# Patient Record
Sex: Female | Born: 1960 | Race: White | Hispanic: No | Marital: Married | Smoking: Former smoker
Health system: Southern US, Community
[De-identification: ages and names within clinical notes are randomized; demographics above are authoritative.]

## PROBLEM LIST (undated history)

## (undated) DIAGNOSIS — D649 Anemia, unspecified: Secondary | ICD-10-CM

## (undated) DIAGNOSIS — Z923 Personal history of irradiation: Secondary | ICD-10-CM

## (undated) DIAGNOSIS — Z87891 Personal history of nicotine dependence: Secondary | ICD-10-CM

## (undated) DIAGNOSIS — R51 Headache: Secondary | ICD-10-CM

## (undated) DIAGNOSIS — R011 Cardiac murmur, unspecified: Secondary | ICD-10-CM

## (undated) DIAGNOSIS — R42 Dizziness and giddiness: Secondary | ICD-10-CM

## (undated) DIAGNOSIS — K219 Gastro-esophageal reflux disease without esophagitis: Secondary | ICD-10-CM

## (undated) DIAGNOSIS — R519 Headache, unspecified: Secondary | ICD-10-CM

## (undated) DIAGNOSIS — C801 Malignant (primary) neoplasm, unspecified: Secondary | ICD-10-CM

## (undated) HISTORY — DX: Dizziness and giddiness: R42

## (undated) HISTORY — DX: Personal history of nicotine dependence: Z87.891

## (undated) HISTORY — DX: Gastro-esophageal reflux disease without esophagitis: K21.9

## (undated) HISTORY — DX: Personal history of irradiation: Z92.3

## (undated) HISTORY — PX: TONSILLECTOMY: SUR1361

---

## 1988-04-08 HISTORY — PX: DILATION AND CURETTAGE, DIAGNOSTIC / THERAPEUTIC: SUR384

## 1997-06-08 ENCOUNTER — Ambulatory Visit (HOSPITAL_COMMUNITY): Admission: RE | Admit: 1997-06-08 | Discharge: 1997-06-08 | Payer: Self-pay | Admitting: Obstetrics & Gynecology

## 1999-08-31 ENCOUNTER — Other Ambulatory Visit: Admission: RE | Admit: 1999-08-31 | Discharge: 1999-08-31 | Payer: Self-pay | Admitting: Obstetrics & Gynecology

## 2001-12-14 ENCOUNTER — Other Ambulatory Visit: Admission: RE | Admit: 2001-12-14 | Discharge: 2001-12-14 | Payer: Self-pay | Admitting: Obstetrics & Gynecology

## 2005-07-24 ENCOUNTER — Other Ambulatory Visit: Admission: RE | Admit: 2005-07-24 | Discharge: 2005-07-24 | Payer: Self-pay | Admitting: Obstetrics & Gynecology

## 2005-07-26 ENCOUNTER — Encounter: Admission: RE | Admit: 2005-07-26 | Discharge: 2005-07-26 | Payer: Self-pay | Admitting: Obstetrics & Gynecology

## 2010-02-06 DIAGNOSIS — R42 Dizziness and giddiness: Secondary | ICD-10-CM

## 2010-02-06 HISTORY — DX: Dizziness and giddiness: R42

## 2011-04-09 LAB — HM PAP SMEAR: HM Pap smear: NORMAL

## 2011-04-30 ENCOUNTER — Other Ambulatory Visit: Payer: Self-pay | Admitting: Obstetrics & Gynecology

## 2011-04-30 DIAGNOSIS — R928 Other abnormal and inconclusive findings on diagnostic imaging of breast: Secondary | ICD-10-CM

## 2011-05-10 ENCOUNTER — Ambulatory Visit
Admission: RE | Admit: 2011-05-10 | Discharge: 2011-05-10 | Disposition: A | Discharge status: Home / Self Care | Payer: BC Managed Care – PPO | Source: Ambulatory Visit | Attending: Obstetrics & Gynecology | Admitting: Obstetrics & Gynecology

## 2011-05-10 DIAGNOSIS — R928 Other abnormal and inconclusive findings on diagnostic imaging of breast: Secondary | ICD-10-CM

## 2011-05-10 LAB — HM MAMMOGRAPHY: HM Mammogram: NORMAL

## 2011-10-21 ENCOUNTER — Encounter: Payer: Self-pay | Admitting: Internal Medicine

## 2011-10-21 ENCOUNTER — Ambulatory Visit (INDEPENDENT_AMBULATORY_CARE_PROVIDER_SITE_OTHER): Payer: BC Managed Care – PPO | Admitting: Internal Medicine

## 2011-10-21 VITALS — BP 118/76 | HR 83 | Temp 97.6°F | Resp 16 | Ht 61.0 in | Wt 144.5 lb

## 2011-10-21 DIAGNOSIS — Z0000 Encounter for general adult medical examination without abnormal findings: Secondary | ICD-10-CM

## 2011-10-21 NOTE — Progress Notes (Signed)
  Subjective:    Patient ID: Karina Briggs, female    DOB: 1960/10/01, 51 y.o.   MRN: [id number]  HPI  New to me for a physical, she feels well and offers no complaints.  Review of Systems  Constitutional: Negative.   HENT: Negative.   Eyes: Negative.   Respiratory: Negative.   Cardiovascular: Negative.   Gastrointestinal: Negative.   Genitourinary: Negative.   Musculoskeletal: Negative.   Neurological: Negative.   Hematological: Negative.   Psychiatric/Behavioral: Negative.        Objective:   Physical Exam  Vitals reviewed. Constitutional: She is oriented to person, place, and time. She appears well-developed and well-nourished. No distress.  HENT:  Head: Normocephalic and atraumatic.  Mouth/Throat: Oropharynx is clear and moist. No oropharyngeal exudate.  Eyes: Conjunctivae are normal. Right eye exhibits no discharge. Left eye exhibits no discharge. No scleral icterus.  Neck: Normal range of motion. Neck supple. No JVD present. No tracheal deviation present. No thyromegaly present.  Cardiovascular: Normal rate, regular rhythm, normal heart sounds and intact distal pulses.  Exam reveals no gallop and no friction rub.   No murmur heard. Pulmonary/Chest: Effort normal and breath sounds normal. No stridor. No respiratory distress. She has no wheezes. She has no rales. She exhibits no tenderness.  Abdominal: Soft. Bowel sounds are normal. She exhibits no distension and no mass. There is no tenderness. There is no rebound and no guarding.  Musculoskeletal: Normal range of motion. She exhibits no edema.  Lymphadenopathy:    She has no cervical adenopathy.  Neurological: She is alert and oriented to person, place, and time. She has normal reflexes. She displays normal reflexes. No cranial nerve deficit. She exhibits normal muscle tone. Coordination normal.  Skin: Skin is warm and dry. No rash noted. She is not diaphoretic. No erythema. No pallor.  Psychiatric: She has a normal  mood and affect. Her behavior is normal. Judgment and thought content normal.          Assessment & Plan:

## 2011-10-21 NOTE — Patient Instructions (Signed)
Preventive Care for Adults, Female A healthy lifestyle and preventive care can promote health and wellness. Preventive health guidelines for women include the following key practices.  A routine yearly physical is a good way to check with your caregiver about your health and preventive screening. It is a chance to share any concerns and updates on your health, and to receive a thorough exam.   Visit your dentist for a routine exam and preventive care every 6 months. Brush your teeth twice a day and floss once a day. Good oral hygiene prevents tooth decay and gum disease.   The frequency of eye exams is based on your age, health, family medical history, use of contact lenses, and other factors. Follow your caregiver's recommendations for frequency of eye exams.   Eat a healthy diet. Foods like vegetables, fruits, whole grains, low-fat dairy products, and lean protein foods contain the nutrients you need without too many calories. Decrease your intake of foods high in solid fats, added sugars, and salt. Eat the right amount of calories for you.Get information about a proper diet from your caregiver, if necessary.   Regular physical exercise is one of the most important things you can do for your health. Most adults should get at least 150 minutes of moderate-intensity exercise (any activity that increases your heart rate and causes you to sweat) each week. In addition, most adults need muscle-strengthening exercises on 2 or more days a week.   Maintain a healthy weight. The body mass index (BMI) is a screening tool to identify possible weight problems. It provides an estimate of body fat based on height and weight. Your caregiver can help determine your BMI, and can help you achieve or maintain a healthy weight.For adults 20 years and older:   A BMI below 18.5 is considered underweight.   A BMI of 18.5 to 24.9 is normal.   A BMI of 25 to 29.9 is considered overweight.   A BMI of 30 and above is  considered obese.   Maintain normal blood lipids and cholesterol levels by exercising and minimizing your intake of saturated fat. Eat a balanced diet with plenty of fruit and vegetables. Blood tests for lipids and cholesterol should begin at age 20 and be repeated every 5 years. If your lipid or cholesterol levels are high, you are over 50, or you are at high risk for heart disease, you may need your cholesterol levels checked more frequently.Ongoing high lipid and cholesterol levels should be treated with medicines if diet and exercise are not effective.   If you smoke, find out from your caregiver how to quit. If you do not use tobacco, do not start.   If you are pregnant, do not drink alcohol. If you are breastfeeding, be very cautious about drinking alcohol. If you are not pregnant and choose to drink alcohol, do not exceed 1 drink per day. One drink is considered to be 12 ounces (355 mL) of beer, 5 ounces (148 mL) of wine, or 1.5 ounces (44 mL) of liquor.   Avoid use of street drugs. Do not share needles with anyone. Ask for help if you need support or instructions about stopping the use of drugs.   High blood pressure causes heart disease and increases the risk of stroke. Your blood pressure should be checked at least every 1 to 2 years. Ongoing high blood pressure should be treated with medicines if weight loss and exercise are not effective.   If you are 55 to 52   years old, ask your caregiver if you should take aspirin to prevent strokes.   Diabetes screening involves taking a blood sample to check your fasting blood sugar level. This should be done once every 3 years, after age 45, if you are within normal weight and without risk factors for diabetes. Testing should be considered at a younger age or be carried out more frequently if you are overweight and have at least 1 risk factor for diabetes.   Breast cancer screening is essential preventive care for women. You should practice "breast  self-awareness." This means understanding the normal appearance and feel of your breasts and may include breast self-examination. Any changes detected, no matter how small, should be reported to a caregiver. Women in their 20s and 30s should have a clinical breast exam (CBE) by a caregiver as part of a regular health exam every 1 to 3 years. After age 40, women should have a CBE every year. Starting at age 40, women should consider having a mammography (breast X-ray test) every year. Women who have a family history of breast cancer should talk to their caregiver about genetic screening. Women at a high risk of breast cancer should talk to their caregivers about having magnetic resonance imaging (MRI) and a mammography every year.   The Pap test is a screening test for cervical cancer. A Pap test can show cell changes on the cervix that might become cervical cancer if left untreated. A Pap test is a procedure in which cells are obtained and examined from the lower end of the uterus (cervix).   Women should have a Pap test starting at age 21.   Between ages 21 and 29, Pap tests should be repeated every 2 years.   Beginning at age 30, you should have a Pap test every 3 years as long as the past 3 Pap tests have been normal.   Some women have medical problems that increase the chance of getting cervical cancer. Talk to your caregiver about these problems. It is especially important to talk to your caregiver if a new problem develops soon after your last Pap test. In these cases, your caregiver may recommend more frequent screening and Pap tests.   The above recommendations are the same for women who have or have not gotten the vaccine for human papillomavirus (HPV).   If you had a hysterectomy for a problem that was not cancer or a condition that could lead to cancer, then you no longer need Pap tests. Even if you no longer need a Pap test, a regular exam is a good idea to make sure no other problems are  starting.   If you are between ages 65 and 70, and you have had normal Pap tests going back 10 years, you no longer need Pap tests. Even if you no longer need a Pap test, a regular exam is a good idea to make sure no other problems are starting.   If you have had past treatment for cervical cancer or a condition that could lead to cancer, you need Pap tests and screening for cancer for at least 20 years after your treatment.   If Pap tests have been discontinued, risk factors (such as a new sexual partner) need to be reassessed to determine if screening should be resumed.   The HPV test is an additional test that may be used for cervical cancer screening. The HPV test looks for the virus that can cause the cell changes on the cervix.   The cells collected during the Pap test can be tested for HPV. The HPV test could be used to screen women aged 30 years and older, and should be used in women of any age who have unclear Pap test results. After the age of 30, women should have HPV testing at the same frequency as a Pap test.   Colorectal cancer can be detected and often prevented. Most routine colorectal cancer screening begins at the age of 50 and continues through age 75. However, your caregiver may recommend screening at an earlier age if you have risk factors for colon cancer. On a yearly basis, your caregiver may provide home test kits to check for hidden blood in the stool. Use of a small camera at the end of a tube, to directly examine the colon (sigmoidoscopy or colonoscopy), can detect the earliest forms of colorectal cancer. Talk to your caregiver about this at age 50, when routine screening begins. Direct examination of the colon should be repeated every 5 to 10 years through age 75, unless early forms of pre-cancerous polyps or small growths are found.   Hepatitis C blood testing is recommended for all people born from 1945 through 1965 and any individual with known risks for hepatitis C.    Practice safe sex. Use condoms and avoid high-risk sexual practices to reduce the spread of sexually transmitted infections (STIs). STIs include gonorrhea, chlamydia, syphilis, trichomonas, herpes, HPV, and human immunodeficiency virus (HIV). Herpes, HIV, and HPV are viral illnesses that have no cure. They can result in disability, cancer, and death. Sexually active women aged 25 and younger should be checked for chlamydia. Older women with new or multiple partners should also be tested for chlamydia. Testing for other STIs is recommended if you are sexually active and at increased risk.   Osteoporosis is a disease in which the bones lose minerals and strength with aging. This can result in serious bone fractures. The risk of osteoporosis can be identified using a bone density scan. Women ages 65 and over and women at risk for fractures or osteoporosis should discuss screening with their caregivers. Ask your caregiver whether you should take a calcium supplement or vitamin D to reduce the rate of osteoporosis.   Menopause can be associated with physical symptoms and risks. Hormone replacement therapy is available to decrease symptoms and risks. You should talk to your caregiver about whether hormone replacement therapy is right for you.   Use sunscreen with sun protection factor (SPF) of 30 or more. Apply sunscreen liberally and repeatedly throughout the day. You should seek shade when your shadow is shorter than you. Protect yourself by wearing long sleeves, pants, a wide-brimmed hat, and sunglasses year round, whenever you are outdoors.   Once a month, do a whole body skin exam, using a mirror to look at the skin on your back. Notify your caregiver of new moles, moles that have irregular borders, moles that are larger than a pencil eraser, or moles that have changed in shape or color.   Stay current with required immunizations.   Influenza. You need a dose every fall (or winter). The composition of  the flu vaccine changes each year, so being vaccinated once is not enough.   Pneumococcal polysaccharide. You need 1 to 2 doses if you smoke cigarettes or if you have certain chronic medical conditions. You need 1 dose at age 65 (or older) if you have never been vaccinated.   Tetanus, diphtheria, pertussis (Tdap, Td). Get 1 dose of   Tdap vaccine if you are younger than age 65, are over 65 and have contact with an infant, are a healthcare worker, are pregnant, or simply want to be protected from whooping cough. After that, you need a Td booster dose every 10 years. Consult your caregiver if you have not had at least 3 tetanus and diphtheria-containing shots sometime in your life or have a deep or dirty wound.   HPV. You need this vaccine if you are a woman age 26 or younger. The vaccine is given in 3 doses over 6 months.   Measles, mumps, rubella (MMR). You need at least 1 dose of MMR if you were born in 1957 or later. You may also need a second dose.   Meningococcal. If you are age 19 to 21 and a first-year college student living in a residence hall, or have one of several medical conditions, you need to get vaccinated against meningococcal disease. You may also need additional booster doses.   Zoster (shingles). If you are age 60 or older, you should get this vaccine.   Varicella (chickenpox). If you have never had chickenpox or you were vaccinated but received only 1 dose, talk to your caregiver to find out if you need this vaccine.   Hepatitis A. You need this vaccine if you have a specific risk factor for hepatitis A virus infection or you simply wish to be protected from this disease. The vaccine is usually given as 2 doses, 6 to 18 months apart.   Hepatitis B. You need this vaccine if you have a specific risk factor for hepatitis B virus infection or you simply wish to be protected from this disease. The vaccine is given in 3 doses, usually over 6 months.  Preventive Services /  Frequency Ages 19 to 39  Blood pressure check.** / Every 1 to 2 years.   Lipid and cholesterol check.** / Every 5 years beginning at age 20.   Clinical breast exam.** / Every 3 years for women in their 20s and 30s.   Pap test.** / Every 2 years from ages 21 through 29. Every 3 years starting at age 30 through age 65 or 70 with a history of 3 consecutive normal Pap tests.   HPV screening.** / Every 3 years from ages 30 through ages 65 to 70 with a history of 3 consecutive normal Pap tests.   Hepatitis C blood test.** / For any individual with known risks for hepatitis C.   Skin self-exam. / Monthly.   Influenza immunization.** / Every year.   Pneumococcal polysaccharide immunization.** / 1 to 2 doses if you smoke cigarettes or if you have certain chronic medical conditions.   Tetanus, diphtheria, pertussis (Tdap, Td) immunization. / A one-time dose of Tdap vaccine. After that, you need a Td booster dose every 10 years.   HPV immunization. / 3 doses over 6 months, if you are 26 and younger.   Measles, mumps, rubella (MMR) immunization. / You need at least 1 dose of MMR if you were born in 1957 or later. You may also need a second dose.   Meningococcal immunization. / 1 dose if you are age 19 to 21 and a first-year college student living in a residence hall, or have one of several medical conditions, you need to get vaccinated against meningococcal disease. You may also need additional booster doses.   Varicella immunization.** / Consult your caregiver.   Hepatitis A immunization.** / Consult your caregiver. 2 doses, 6 to 18 months   apart.   Hepatitis B immunization.** / Consult your caregiver. 3 doses usually over 6 months.  Ages 40 to 64  Blood pressure check.** / Every 1 to 2 years.   Lipid and cholesterol check.** / Every 5 years beginning at age 20.   Clinical breast exam.** / Every year after age 40.   Mammogram.** / Every year beginning at age 40 and continuing for as  long as you are in good health. Consult with your caregiver.   Pap test.** / Every 3 years starting at age 30 through age 65 or 70 with a history of 3 consecutive normal Pap tests.   HPV screening.** / Every 3 years from ages 30 through ages 65 to 70 with a history of 3 consecutive normal Pap tests.   Fecal occult blood test (FOBT) of stool. / Every year beginning at age 50 and continuing until age 75. You may not need to do this test if you get a colonoscopy every 10 years.   Flexible sigmoidoscopy or colonoscopy.** / Every 5 years for a flexible sigmoidoscopy or every 10 years for a colonoscopy beginning at age 50 and continuing until age 75.   Hepatitis C blood test.** / For all people born from 1945 through 1965 and any individual with known risks for hepatitis C.   Skin self-exam. / Monthly.   Influenza immunization.** / Every year.   Pneumococcal polysaccharide immunization.** / 1 to 2 doses if you smoke cigarettes or if you have certain chronic medical conditions.   Tetanus, diphtheria, pertussis (Tdap, Td) immunization.** / A one-time dose of Tdap vaccine. After that, you need a Td booster dose every 10 years.   Measles, mumps, rubella (MMR) immunization. / You need at least 1 dose of MMR if you were born in 1957 or later. You may also need a second dose.   Varicella immunization.** / Consult your caregiver.   Meningococcal immunization.** / Consult your caregiver.   Hepatitis A immunization.** / Consult your caregiver. 2 doses, 6 to 18 months apart.   Hepatitis B immunization.** / Consult your caregiver. 3 doses, usually over 6 months.  Ages 65 and over  Blood pressure check.** / Every 1 to 2 years.   Lipid and cholesterol check.** / Every 5 years beginning at age 20.   Clinical breast exam.** / Every year after age 40.   Mammogram.** / Every year beginning at age 40 and continuing for as long as you are in good health. Consult with your caregiver.   Pap test.** /  Every 3 years starting at age 30 through age 65 or 70 with a 3 consecutive normal Pap tests. Testing can be stopped between 65 and 70 with 3 consecutive normal Pap tests and no abnormal Pap or HPV tests in the past 10 years.   HPV screening.** / Every 3 years from ages 30 through ages 65 or 70 with a history of 3 consecutive normal Pap tests. Testing can be stopped between 65 and 70 with 3 consecutive normal Pap tests and no abnormal Pap or HPV tests in the past 10 years.   Fecal occult blood test (FOBT) of stool. / Every year beginning at age 50 and continuing until age 75. You may not need to do this test if you get a colonoscopy every 10 years.   Flexible sigmoidoscopy or colonoscopy.** / Every 5 years for a flexible sigmoidoscopy or every 10 years for a colonoscopy beginning at age 50 and continuing until age 75.   Hepatitis   C blood test.** / For all people born from 1945 through 1965 and any individual with known risks for hepatitis C.   Osteoporosis screening.** / A one-time screening for women ages 65 and over and women at risk for fractures or osteoporosis.   Skin self-exam. / Monthly.   Influenza immunization.** / Every year.   Pneumococcal polysaccharide immunization.** / 1 dose at age 65 (or older) if you have never been vaccinated.   Tetanus, diphtheria, pertussis (Tdap, Td) immunization. / A one-time dose of Tdap vaccine if you are over 65 and have contact with an infant, are a healthcare worker, or simply want to be protected from whooping cough. After that, you need a Td booster dose every 10 years.   Varicella immunization.** / Consult your caregiver.   Meningococcal immunization.** / Consult your caregiver.   Hepatitis A immunization.** / Consult your caregiver. 2 doses, 6 to 18 months apart.   Hepatitis B immunization.** / Check with your caregiver. 3 doses, usually over 6 months.  ** Family history and personal history of risk and conditions may change your caregiver's  recommendations. Document Released: 05/21/2001 Document Revised: 03/14/2011 Document Reviewed: 08/20/2010 [employer] Patient Information 2012 [employer]. 

## 2011-10-22 ENCOUNTER — Encounter: Payer: Self-pay | Admitting: Internal Medicine

## 2011-10-22 ENCOUNTER — Other Ambulatory Visit (INDEPENDENT_AMBULATORY_CARE_PROVIDER_SITE_OTHER): Payer: BC Managed Care – PPO

## 2011-10-22 DIAGNOSIS — Z0000 Encounter for general adult medical examination without abnormal findings: Secondary | ICD-10-CM

## 2011-10-22 LAB — COMPREHENSIVE METABOLIC PANEL WITH GFR
ALT: 14 U/L (ref 0–35)
AST: 15 U/L (ref 0–37)
Albumin: 3.7 g/dL (ref 3.5–5.2)
Alkaline Phosphatase: 46 U/L (ref 39–117)
BUN: 14 mg/dL (ref 6–23)
CO2: 27 meq/L (ref 19–32)
Calcium: 8.7 mg/dL (ref 8.4–10.5)
Chloride: 105 meq/L (ref 96–112)
Creatinine, Ser: 0.9 mg/dL (ref 0.4–1.2)
GFR: 72.78 mL/min
Glucose, Bld: 88 mg/dL (ref 70–99)
Potassium: 3.8 meq/L (ref 3.5–5.1)
Sodium: 139 meq/L (ref 135–145)
Total Bilirubin: 0.6 mg/dL (ref 0.3–1.2)
Total Protein: 6.4 g/dL (ref 6.0–8.3)

## 2011-10-22 LAB — CBC WITH DIFFERENTIAL/PLATELET
Basophils Absolute: 0.1 K/uL (ref 0.0–0.1)
Basophils Relative: 1.1 % (ref 0.0–3.0)
Eosinophils Absolute: 0.7 K/uL (ref 0.0–0.7)
Eosinophils Relative: 7.3 % — ABNORMAL HIGH (ref 0.0–5.0)
HCT: 41.1 % (ref 36.0–46.0)
Hemoglobin: 13.8 g/dL (ref 12.0–15.0)
Lymphocytes Relative: 26.9 % (ref 12.0–46.0)
Lymphs Abs: 2.5 K/uL (ref 0.7–4.0)
MCHC: 33.6 g/dL (ref 30.0–36.0)
MCV: 96.3 fl (ref 78.0–100.0)
Monocytes Absolute: 0.7 K/uL (ref 0.1–1.0)
Monocytes Relative: 7.7 % (ref 3.0–12.0)
Neutro Abs: 5.3 K/uL (ref 1.4–7.7)
Neutrophils Relative %: 57 % (ref 43.0–77.0)
Platelets: 214 K/uL (ref 150.0–400.0)
RBC: 4.26 Mil/uL (ref 3.87–5.11)
RDW: 13.2 % (ref 11.5–14.6)
WBC: 9.3 K/uL (ref 4.5–10.5)

## 2011-10-22 LAB — URINALYSIS, ROUTINE W REFLEX MICROSCOPIC
Specific Gravity, Urine: 1.015 (ref 1.000–1.030)
Total Protein, Urine: NEGATIVE
Urine Glucose: NEGATIVE
pH: 6 (ref 5.0–8.0)

## 2011-10-22 LAB — LIPID PANEL
Cholesterol: 206 mg/dL — ABNORMAL HIGH (ref 0–200)
HDL: 64.4 mg/dL
Total CHOL/HDL Ratio: 3
Triglycerides: 58 mg/dL (ref 0.0–149.0)
VLDL: 11.6 mg/dL (ref 0.0–40.0)

## 2011-10-22 NOTE — Assessment & Plan Note (Signed)
Exam done, labs ordered, pt ed material was given 

## 2011-10-23 LAB — TSH: TSH: 1.34 u[IU]/mL (ref 0.35–5.50)

## 2011-10-23 LAB — LDL CHOLESTEROL, DIRECT: Direct LDL: 130.8 mg/dL

## 2011-10-24 ENCOUNTER — Encounter: Payer: Self-pay | Admitting: Internal Medicine

## 2014-09-26 ENCOUNTER — Ambulatory Visit: Payer: Self-pay | Admitting: Gynecologic Oncology

## 2014-09-26 ENCOUNTER — Ambulatory Visit: Payer: BLUE CROSS/BLUE SHIELD | Attending: Gynecologic Oncology | Admitting: Gynecologic Oncology

## 2014-09-26 ENCOUNTER — Encounter: Payer: Self-pay | Admitting: Gynecologic Oncology

## 2014-09-26 VITALS — BP 121/67 | HR 60 | Temp 98.0°F | Resp 18 | Ht 61.0 in | Wt 143.3 lb

## 2014-09-26 DIAGNOSIS — C541 Malignant neoplasm of endometrium: Secondary | ICD-10-CM | POA: Insufficient documentation

## 2014-09-26 NOTE — Progress Notes (Signed)
Consult Note: Gyn-Onc  Consult was requested by Dr. Nori Briggs for the evaluation of Karina Briggs 54 y.o. female  CC:  Chief Complaint  Patient presents with  . endometrial cancer    New consult    Assessment/Plan:  Karina Briggs  is a 54 y.o.  year old with grade 1 endometrioid endometrial cancer, clinical stage I.   A detailed discussion was held with the patient and her family with regard to to her endometrial cancer diagnosis. We discussed the standard management options for uterine cancer which includes surgery followed possibly by adjuvant therapy depending on the results of surgery. The options for surgical management include a hysterectomy and removal of the tubes and ovaries and lymph node sampling. A minimally invasive approach including a robotic hysterectomy or laparoscopic hysterectomy have benefits including shorter hospital stay, recovery time and better wound healing. The alternative approach is an open hysterectomy. The patient has been counseled about these surgical options and the risks of surgery in general including infection, bleeding, damage to surrounding structures (including bowel, bladder, ureters, nerves or vessels), and the postoperative risks of PE/ DVT, and lymphedema. I extensively reviewed the additional risks of robotic hysterectomy including possible need for conversion to open laparotomy.  I discussed positioning during surgery of trendelenberg and risks of minor facial swelling and care we take in preoperative positioning.  After counseling and consideration of her options, she desires to proceed with robotic assisted total hysterectomy, BSO, sentinel lymph node mapping, possible lymphadenectomy.   She will be seen by anesthesia for preoperative clearance and discussion of postoperative pain management.  She was given the opportunity to ask questions, which were answered to her satisfaction, and she is agreement with the above mentioned plan of  care.   HPI: Karina Briggs is a very pleasant G2 P2 who is seen in consultation at the request of Dr. Nori Briggs for grade 1 endometrioid endometrial adenocarcinoma. The patient has been on post menopausal hormone replacement therapy with asked region and every 3 monthly progesterone for approximately one year. In that time she felt to have a normal withdrawal bleeding on progesterone however had regular vaginal spotting. This prompted Dr. Milta Briggs to perform an ultrasound evaluation of the uterus on 08/24/2014. This revealed a 9.5 x 5.3 x 5.7 cm uterus with a 56 mm endometrial stripe. Ovaries were normal. An office endometrial sampling with pelvic biopsy took place on 08/24/2014 and revealed grade 1 adenocarcinoma, endometrioid type.   She is overweight but not obese, with otherwise no major risk factors for endometrial cancer. She has had no prior abdominal surgeries. She has no major medical comorbidities. She has no significant family history for cancer.  Current Meds:  Outpatient Encounter Prescriptions as of 09/26/2014  Medication Sig  . estradiol (MINIVELLE) 0.05 MG/24HR patch Place 1 patch onto the skin 2 (two) times a week.  . loratadine (CLARITIN) 10 MG tablet Take 10 mg by mouth daily as needed.   . [DISCONTINUED] aspirin EC 81 MG tablet Take 81 mg by mouth daily.   No facility-administered encounter medications on file as of 09/26/2014.    Allergy:  Allergies  Allergen Reactions  . Penicillins     Childhood reaction    Social Hx:   History   Social History  . Marital Status: Married    Spouse Name: N/A  . Number of Children: N/A  . Years of Education: N/A   Occupational History  . Not on file.   Social History Main Topics  .  Smoking status: Current Some Day Smoker    Types: Cigarettes  . Smokeless tobacco: Never Used  . Alcohol Use: No  . Drug Use: No  . Sexual Activity: Not Currently    Birth Control/ Protection: Post-menopausal   Other Topics Concern  . Not on file    Social History Narrative    Past Surgical Hx:  Past Surgical History  Procedure Laterality Date  . Dilation and curettage, diagnostic / therapeutic  1990    Past Medical Hx:  Past Medical History  Diagnosis Date  . Dizziness - light-headed 02/2010    treated by ENT and neurology    Past Gynecological History:  SVD x 2  No LMP recorded. Patient is not currently having periods (Reason: Perimenopausal).  Family Hx:  Family History  Problem Relation Age of Onset  . Hypertension Mother   . Arthritis Mother   . Alcohol abuse Neg Hx   . Cancer Neg Hx   . COPD Neg Hx   . Depression Neg Hx   . Diabetes Neg Hx   . Early death Neg Hx   . Heart disease Neg Hx   . Hyperlipidemia Neg Hx   . Stroke Neg Hx     Review of Systems:  Constitutional  Feels well,    ENT Normal appearing ears and nares bilaterally Skin/Breast  No rash, sores, jaundice, itching, dryness Cardiovascular  No chest pain, shortness of breath, or edema  Pulmonary  No cough or wheeze.  Gastro Intestinal  No nausea, vomitting, or diarrhoea. No bright red blood per rectum, no abdominal pain, change in bowel movement, or constipation.  Genito Urinary  No frequency, urgency, dysuria, see HPI Musculo Skeletal  No myalgia, arthralgia, joint swelling or pain  Neurologic  No weakness, numbness, change in gait,  Psychology  No depression, anxiety, insomnia.   Vitals:  Blood pressure 121/67, pulse 60, temperature 98 F (36.7 C), temperature source Oral, resp. rate 18, height 5\' 1"  (1.549 m), weight 143 lb 4.8 oz (65 kg), SpO2 100 %.  Physical Exam: WD in NAD Neck  Supple NROM, without any enlargements.  Lymph Node Survey No cervical supraclavicular or inguinal adenopathy Cardiovascular  Pulse normal rate, regularity and rhythm. S1 and S2 normal.  Lungs  Clear to auscultation bilateraly, without wheezes/crackles/rhonchi. Good air movement.  Skin  No rash/lesions/breakdown  Psychiatry  Alert and  oriented to person, place, and time  Abdomen  Normoactive bowel sounds, abdomen soft, non-tender and obese without evidence of hernia.  Back No CVA tenderness Genito Urinary  Vulva/vagina: Normal external female genitalia.  No lesions. No discharge or bleeding.  Bladder/urethra:  No lesions or masses, well supported bladder  Vagina: normal in appearance  Cervix: Normal appearing, no lesions.  Uterus: Small, mobile, no parametrial involvement or nodularity.  Adnexa: no palpable masses. Rectal  Good tone, no masses no cul de sac nodularity.  Extremities  No bilateral cyanosis, clubbing or edema.   Donaciano Eva, MD   09/26/2014, 2:50 PM

## 2014-09-26 NOTE — Patient Instructions (Signed)
Preparing for your Surgery  Plan for surgery on July 26 with Dr. Denman George.  Pre-operative Testing -You will receive a phone call from presurgical testing at [facility] to arrange for a pre-operative testing appointment before your surgery.  This appointment normally occurs one to two weeks before your scheduled surgery.   -Bring your insurance card, copy of an advanced directive if applicable, medication list  -At that visit, you will be asked to sign a consent for a possible blood transfusion in case a transfusion becomes necessary during surgery.  The need for a blood transfusion is rare but having consent is a necessary part of your care.     -You should not be taking blood thinners or aspirin at least ten days prior to surgery unless instructed by your surgeon.  Day Before Surgery at [place] will be asked to take in only clear liquids the day before surgery.  Examples of clear liquids include broths, jello, and clear juices. You will be advised to have nothing to eat or drink after midnight the evening before.    Your role in recovery Your role is to become active as soon as directed by your doctor, while still giving yourself time to heal.  Rest when you feel tired. You will be asked to do the following in order to speed your recovery:  - Cough and breathe deeply. This helps toclear and expand your lungs and can prevent pneumonia. You may be given a spirometer to practice deep breathing. A staff member will show you how to use the spirometer. - Do mild physical activity. Walking or moving your legs help your circulation and body functions return to normal. A staff member will help you when you try to walk and will provide you with simple exercises. Do not try to get up or walk alone the first time. - Actively manage your pain. Managing your pain lets you move in comfort. We will ask you to rate your pain on a scale of zero to 10. It is your responsibility to tell your  doctor or nurse where and how much you hurt so your pain can be treated.  Special Considerations -If you are diabetic, you may be placed on insulin after surgery to have closer control over your blood sugars to promote healing and recovery.  This does not mean that you will be discharged on insulin.  If applicable, your oral antidiabetics will be resumed when you are tolerating a solid diet.  -Your final pathology results from surgery should be available by the Friday after surgery and the results will be relayed to you when available.  Blood Transfusion Information WHAT IS A BLOOD TRANSFUSION? A transfusion is the replacement of blood or some of its parts. Blood is made up of multiple cells which provide different functions.  Red blood cells carry oxygen and are used for blood loss replacement.  White blood cells fight against infection.  Platelets control bleeding.  Plasma helps clot blood.  Other blood products are available for specialized needs, such as hemophilia or other clotting disorders. BEFORE THE TRANSFUSION  Who gives blood for transfusions?   You may be able to donate blood to be used at a later date on yourself (autologous donation).  Relatives can be asked to donate blood. This is generally not any safer than if you have received blood from a stranger. The same precautions are taken to ensure safety when a relative's blood is donated.  Healthy volunteers who are fully evaluated  to make sure their blood is safe. This is blood bank blood. Transfusion therapy is the safest it has ever been in the practice of medicine. Before blood is taken from a donor, a complete history is taken to make sure that person has no history of diseases nor engages in risky social behavior (examples are intravenous drug use or sexual activity with multiple partners). The donor's travel history is screened to minimize risk of transmitting infections, such as malaria. The donated blood is tested for  signs of infectious diseases, such as HIV and hepatitis. The blood is then tested to be sure it is compatible with you in order to minimize the chance of a transfusion reaction. If you or a relative donates blood, this is often done in anticipation of surgery and is not appropriate for emergency situations. It takes many days to process the donated blood. RISKS AND COMPLICATIONS Although transfusion therapy is very safe and saves many lives, the main dangers of transfusion include:   Getting an infectious disease.  Developing a transfusion reaction. This is an allergic reaction to something in the blood you were given. Every precaution is taken to prevent this. The decision to have a blood transfusion has been considered carefully by your caregiver before blood is given. Blood is not given unless the benefits outweigh the risks.

## 2014-09-30 ENCOUNTER — Telehealth: Payer: Self-pay | Admitting: *Deleted

## 2014-09-30 NOTE — Telephone Encounter (Signed)
Patient scheduled for surgery at [facility] on 10/07/14. Records faxed to [place] at [facility].

## 2014-10-19 NOTE — Progress Notes (Signed)
Please put orders in Epic surgery 10-31-17 pre op 10-28-14 Thanks

## 2014-10-26 ENCOUNTER — Encounter (HOSPITAL_COMMUNITY): Payer: Self-pay

## 2014-10-26 NOTE — Patient Instructions (Addendum)
SHUNTA MCLAURIN  10/26/2014   Your procedure is scheduled on: November 01, 2014  Report to [facility] Main  Entrance take [place]  elevators to 3rd floor to  [place] at 5:30 AM.  Call this number if you have problems the morning of surgery [phone]   Remember: ONLY 1 PERSON MAY GO WITH YOU TO SHORT STAY TO GET  READY MORNING OF [place].  Do not eat food after midnight Sunday night. Follow clear liquid diet beginning AM on Monday.  Then nothing by mouth after midnight Monday night.  No carbonated beverages   Take these medicines the morning of surgery with A SIP OF WATER: none                               You may not have any metal on your body including hair pins and              piercings  Do not wear jewelry, make-up, lotions, powders or perfumes, deodorant             Do not wear nail polish.  Do not shave  48 hours prior to surgery.          .   Do not bring valuables to the hospital. [place].  Contacts, dentures or bridgework may not be worn into surgery.  Leave suitcase in the car. After surgery it may be brought to your room.        Special Instructions: coughing and deep breathing exercises, leg exercises.              Please read over the following fact sheets you were given: _____________________________________________________________________             [facility] - Preparing for Surgery Before surgery, you can play an important role.  Because skin is not sterile, your skin needs to be as free of germs as possible.  You can reduce the number of germs on your skin by washing with CHG (chlorahexidine gluconate) soap before surgery.  CHG is an antiseptic cleaner which kills germs and bonds with the skin to continue killing germs even after washing. Please DO NOT use if you have an allergy to CHG or antibacterial soaps.  If your skin becomes reddened/irritated stop using the CHG and  inform your nurse when you arrive at Short Stay. Do not shave (including legs and underarms) for at least 48 hours prior to the first CHG shower.  You may shave your face/neck. Please follow these instructions carefully:  1.  Shower with CHG Soap the night before surgery and the  morning of Surgery.  2.  If you choose to wash your hair, wash your hair first as usual with your  normal  shampoo.  3.  After you shampoo, rinse your hair and body thoroughly to remove the  shampoo.                           4.  Use CHG as you would any other liquid soap.  You can apply chg directly  to the skin and wash  Gently with a scrungie or clean washcloth.  5.  Apply the CHG Soap to your body ONLY FROM THE NECK DOWN.   Do not use on face/ open                           Wound or open sores. Avoid contact with eyes, ears mouth and genitals (private parts).                       Wash face,  Genitals (private parts) with your normal soap.             6.  Wash thoroughly, paying special attention to the area where your surgery  will be performed.  7.  Thoroughly rinse your body with warm water from the neck down.  8.  DO NOT shower/wash with your normal soap after using and rinsing off  the CHG Soap.                9.  Pat yourself dry with a clean towel.            10.  Wear clean pajamas.            11.  Place clean sheets on your bed the night of your first shower and do not  sleep with pets. Day of Surgery : Do not apply any lotions/deodorants the morning of surgery.  Please wear clean clothes to the hospital/surgery center.  FAILURE TO FOLLOW THESE INSTRUCTIONS MAY RESULT IN THE CANCELLATION OF YOUR SURGERY PATIENT SIGNATURE_________________________________  NURSE SIGNATURE__________________________________  ________________________________________________________________________  WHAT IS A BLOOD TRANSFUSION? Blood Transfusion Information  A transfusion is the replacement of blood or  some of its parts. Blood is made up of multiple cells which provide different functions.  Red blood cells carry oxygen and are used for blood loss replacement.  White blood cells fight against infection.  Platelets control bleeding.  Plasma helps clot blood.  Other blood products are available for specialized needs, such as hemophilia or other clotting disorders. BEFORE THE TRANSFUSION  Who gives blood for transfusions?   Healthy volunteers who are fully evaluated to make sure their blood is safe. This is blood bank blood. Transfusion therapy is the safest it has ever been in the practice of medicine. Before blood is taken from a donor, a complete history is taken to make sure that person has no history of diseases nor engages in risky social behavior (examples are intravenous drug use or sexual activity with multiple partners). The donor's travel history is screened to minimize risk of transmitting infections, such as malaria. The donated blood is tested for signs of infectious diseases, such as HIV and hepatitis. The blood is then tested to be sure it is compatible with you in order to minimize the chance of a transfusion reaction. If you or a relative donates blood, this is often done in anticipation of surgery and is not appropriate for emergency situations. It takes many days to process the donated blood. RISKS AND COMPLICATIONS Although transfusion therapy is very safe and saves many lives, the main dangers of transfusion include:  1. Getting an infectious disease. 2. Developing a transfusion reaction. This is an allergic reaction to something in the blood you were given. Every precaution is taken to prevent this. The decision to have a blood transfusion has been considered carefully by your caregiver before blood is given. Blood is not given unless the benefits outweigh  the risks. AFTER THE TRANSFUSION  Right after receiving a blood transfusion, you will usually feel much better and  more energetic. This is especially true if your red blood cells have gotten low (anemic). The transfusion raises the level of the red blood cells which carry oxygen, and this usually causes an energy increase.  The nurse administering the transfusion will monitor you carefully for complications. HOME CARE INSTRUCTIONS  No special instructions are needed after a transfusion. You may find your energy is better. Speak with your caregiver about any limitations on activity for underlying diseases you may have. SEEK MEDICAL CARE IF:   Your condition is not improving after your transfusion.  You develop redness or irritation at the intravenous (IV) site. SEEK IMMEDIATE MEDICAL CARE IF:  Any of the following symptoms occur over the next 12 hours:  Shaking chills.  You have a temperature by mouth above 102 F (38.9 C), not controlled by medicine.  Chest, back, or muscle pain.  People around you feel you are not acting correctly or are confused.  Shortness of breath or difficulty breathing.  Dizziness and fainting.  You get a rash or develop hives.  You have a decrease in urine output.  Your urine turns a dark color or changes to pink, red, or brown. Any of the following symptoms occur over the next 10 days:  You have a temperature by mouth above 102 F (38.9 C), not controlled by medicine.  Shortness of breath.  Weakness after normal activity.  The white part of the eye turns yellow (jaundice).  You have a decrease in the amount of urine or are urinating less often.  Your urine turns a dark color or changes to pink, red, or brown. Document Released: 03/22/2000 Document Revised: 06/17/2011 Document Reviewed: 11/09/2007 ExitCare Patient Information 2014 [place].  _______________________________________________________________________  Incentive Spirometer  An incentive spirometer is a tool that can help keep your lungs clear and active. This tool measures how well you  are filling your lungs with each breath. Taking long deep breaths may help reverse or decrease the chance of developing breathing (pulmonary) problems (especially infection) following:  A long period of time when you are unable to move or be active. BEFORE THE PROCEDURE   If the spirometer includes an indicator to show your best effort, your nurse or respiratory therapist will set it to a desired goal.  If possible, sit up straight or lean slightly forward. Try not to slouch.  Hold the incentive spirometer in an upright position. INSTRUCTIONS FOR USE  3. Sit on the edge of your bed if possible, or sit up as far as you can in bed or on a chair. 4. Hold the incentive spirometer in an upright position. 5. Breathe out normally. 6. Place the mouthpiece in your mouth and seal your lips tightly around it. 7. Breathe in slowly and as deeply as possible, raising the piston or the ball toward the top of the column. 8. Hold your breath for 3-5 seconds or for as long as possible. Allow the piston or ball to fall to the bottom of the column. 9. Remove the mouthpiece from your mouth and breathe out normally. 10. Rest for a few seconds and repeat Steps 1 through 7 at least 10 times every 1-2 hours when you are awake. Take your time and take a few normal breaths between deep breaths. 11. The spirometer may include an indicator to show your best effort. Use the indicator as a goal to work  toward during each repetition. 12. After each set of 10 deep breaths, practice coughing to be sure your lungs are clear. If you have an incision (the cut made at the time of surgery), support your incision when coughing by placing a pillow or rolled up towels firmly against it. Once you are able to get out of bed, walk around indoors and cough well. You may stop using the incentive spirometer when instructed by your caregiver.  RISKS AND COMPLICATIONS  Take your time so you do not get dizzy or light-headed.  If you are in  pain, you may need to take or ask for pain medication before doing incentive spirometry. It is harder to take a deep breath if you are having pain. AFTER USE  Rest and breathe slowly and easily.  It can be helpful to keep track of a log of your progress. Your caregiver can provide you with a simple table to help with this. If you are using the spirometer at home, follow these instructions: [place] IF:   You are having difficultly using the spirometer.  You have trouble using the spirometer as often as instructed.  Your pain medication is not giving enough relief while using the spirometer.  You develop fever of 100.5 F (38.1 C) or higher. SEEK IMMEDIATE MEDICAL CARE IF:   You cough up bloody sputum that had not been present before.  You develop fever of 102 F (38.9 C) or greater.  You develop worsening pain at or near the incision site. MAKE SURE YOU:   Understand these instructions.  Will watch your condition.  Will get help right away if you are not doing well or get worse. Document Released: 08/05/2006 Document Revised: 06/17/2011 Document Reviewed: 10/06/2006 ExitCare Patient Information 2014 ExitCare, [place].   ________________________________________________________________________    CLEAR LIQUID DIET   Foods Allowed                                                                     Foods Excluded  Coffee and tea, regular and decaf                             liquids that you cannot  Plain Jell-O in any flavor                                             see through such as: Fruit ices (not with fruit pulp)                                     milk, soups, orange juice  Iced Popsicles                                    All solid food  Cranberry, grape and apple juices Sports drinks like Gatorade Lightly seasoned clear broth or consume(fat free) Sugar, honey syrup  Sample Menu Breakfast                                 Lunch                                     Supper Cranberry juice                    Beef broth                            Chicken broth Jell-O                                     Grape juice                           Apple juice Coffee or tea                        Jell-O                                      Popsicle                                                Coffee or tea                        Coffee or tea  _____________________________________________________________________

## 2014-10-28 ENCOUNTER — Encounter (HOSPITAL_COMMUNITY)
Admission: RE | Admit: 2014-10-28 | Discharge: 2014-10-28 | Disposition: A | Discharge status: Home / Self Care | Payer: BLUE CROSS/BLUE SHIELD | Source: Ambulatory Visit | Attending: Gynecologic Oncology | Admitting: Gynecologic Oncology

## 2014-10-28 ENCOUNTER — Encounter (HOSPITAL_COMMUNITY): Payer: Self-pay

## 2014-10-28 DIAGNOSIS — Z01812 Encounter for preprocedural laboratory examination: Secondary | ICD-10-CM | POA: Insufficient documentation

## 2014-10-28 DIAGNOSIS — C541 Malignant neoplasm of endometrium: Secondary | ICD-10-CM | POA: Diagnosis not present

## 2014-10-28 HISTORY — DX: Headache, unspecified: R51.9

## 2014-10-28 HISTORY — DX: Malignant (primary) neoplasm, unspecified: C80.1

## 2014-10-28 HISTORY — DX: Anemia, unspecified: D64.9

## 2014-10-28 HISTORY — DX: Cardiac murmur, unspecified: R01.1

## 2014-10-28 HISTORY — DX: Headache: R51

## 2014-10-28 LAB — URINE MICROSCOPIC-ADD ON

## 2014-10-28 LAB — CBC WITH DIFFERENTIAL/PLATELET
BASOS PCT: 1 % (ref 0–1)
Basophils Absolute: 0.1 10*3/uL (ref 0.0–0.1)
Eosinophils Absolute: 0.4 10*3/uL (ref 0.0–0.7)
Eosinophils Relative: 5 % (ref 0–5)
HEMATOCRIT: 43 % (ref 36.0–46.0)
Hemoglobin: 14.3 g/dL (ref 12.0–15.0)
Lymphocytes Relative: 27 % (ref 12–46)
Lymphs Abs: 2.2 10*3/uL (ref 0.7–4.0)
MCH: 31.8 pg (ref 26.0–34.0)
MCHC: 33.3 g/dL (ref 30.0–36.0)
MCV: 95.6 fL (ref 78.0–100.0)
Monocytes Absolute: 0.6 10*3/uL (ref 0.1–1.0)
Monocytes Relative: 8 % (ref 3–12)
NEUTROS PCT: 59 % (ref 43–77)
Neutro Abs: 4.9 10*3/uL (ref 1.7–7.7)
PLATELETS: 253 10*3/uL (ref 150–400)
RBC: 4.5 MIL/uL (ref 3.87–5.11)
RDW: 13.1 % (ref 11.5–15.5)
WBC: 8.1 10*3/uL (ref 4.0–10.5)

## 2014-10-28 LAB — COMPREHENSIVE METABOLIC PANEL
ALT: 11 U/L — AB (ref 14–54)
AST: 17 U/L (ref 15–41)
Albumin: 4.1 g/dL (ref 3.5–5.0)
Alkaline Phosphatase: 52 U/L (ref 38–126)
Anion gap: 8 (ref 5–15)
BILIRUBIN TOTAL: 0.3 mg/dL (ref 0.3–1.2)
BUN: 18 mg/dL (ref 6–20)
CHLORIDE: 104 mmol/L (ref 101–111)
CO2: 27 mmol/L (ref 22–32)
CREATININE: 0.87 mg/dL (ref 0.44–1.00)
Calcium: 9.4 mg/dL (ref 8.9–10.3)
GFR calc Af Amer: >60 mL/min (ref >60)
Glucose, Bld: 93 mg/dL (ref 65–99)
Potassium: 4.3 mmol/L (ref 3.5–5.1)
SODIUM: 139 mmol/L (ref 135–145)
TOTAL PROTEIN: 7.1 g/dL (ref 6.5–8.1)

## 2014-10-28 LAB — URINALYSIS, ROUTINE W REFLEX MICROSCOPIC
Bilirubin Urine: NEGATIVE
Glucose, UA: NEGATIVE mg/dL
Hgb urine dipstick: NEGATIVE
KETONES UR: NEGATIVE mg/dL
NITRITE: NEGATIVE
Protein, ur: NEGATIVE mg/dL
Specific Gravity, Urine: 1.02 (ref 1.005–1.030)
Urobilinogen, UA: 0.2 mg/dL (ref 0.0–1.0)
pH: 6 (ref 5.0–8.0)

## 2014-10-28 LAB — ABO/RH: ABO/RH(D): A POS

## 2014-10-28 NOTE — Progress Notes (Addendum)
10-26-14 - Pt. Had CXR  On 10-03-14 at [facility].  Called Dr. Serita Grit office and was instructed that we do not need a repeat CXR at time of preop visit.   10-03-14 - EKG - in chart 10-03-14 - 2V CXR - EPIC (care everywhere) 10-03-14 - CBC w/diff, CMP - EPIC (care everywhere) 09-26-14 - LOV - Dr. Denman George - EPIC

## 2014-10-28 NOTE — Progress Notes (Signed)
10-28-14 Faxed Urine culture and UA/Micro to Dr. [name] via [facility]

## 2014-11-01 ENCOUNTER — Ambulatory Visit (HOSPITAL_COMMUNITY): Payer: BLUE CROSS/BLUE SHIELD | Admitting: Anesthesiology

## 2014-11-01 ENCOUNTER — Encounter (HOSPITAL_COMMUNITY)
Admission: RE | Disposition: A | Discharge status: Home / Self Care | Payer: Self-pay | Source: Ambulatory Visit | Attending: Gynecologic Oncology

## 2014-11-01 ENCOUNTER — Ambulatory Visit (HOSPITAL_COMMUNITY)
Admission: RE | Admit: 2014-11-01 | Discharge: 2014-11-02 | Disposition: A | Discharge status: Home / Self Care | Payer: BLUE CROSS/BLUE SHIELD | Source: Ambulatory Visit | Attending: Gynecologic Oncology | Admitting: Gynecologic Oncology

## 2014-11-01 ENCOUNTER — Encounter (HOSPITAL_COMMUNITY): Payer: Self-pay | Admitting: *Deleted

## 2014-11-01 DIAGNOSIS — Z880 Allergy status to penicillin: Secondary | ICD-10-CM | POA: Diagnosis not present

## 2014-11-01 DIAGNOSIS — F17210 Nicotine dependence, cigarettes, uncomplicated: Secondary | ICD-10-CM | POA: Diagnosis not present

## 2014-11-01 DIAGNOSIS — Z79890 Hormone replacement therapy (postmenopausal): Secondary | ICD-10-CM | POA: Diagnosis not present

## 2014-11-01 DIAGNOSIS — Z79899 Other long term (current) drug therapy: Secondary | ICD-10-CM | POA: Diagnosis not present

## 2014-11-01 DIAGNOSIS — N800 Endometriosis of uterus: Secondary | ICD-10-CM | POA: Diagnosis not present

## 2014-11-01 DIAGNOSIS — C541 Malignant neoplasm of endometrium: Secondary | ICD-10-CM | POA: Diagnosis present

## 2014-11-01 HISTORY — PX: ROBOTIC ASSISTED TOTAL HYSTERECTOMY WITH BILATERAL SALPINGO OOPHERECTOMY: SHX6086

## 2014-11-01 LAB — TYPE AND SCREEN
ABO/RH(D): A POS
ANTIBODY SCREEN: NEGATIVE

## 2014-11-01 SURGERY — ROBOTIC ASSISTED TOTAL HYSTERECTOMY WITH BILATERAL SALPINGO OOPHORECTOMY
Anesthesia: General | Laterality: Bilateral

## 2014-11-01 MED ORDER — NEOSTIGMINE METHYLSULFATE 10 MG/10ML IV SOLN
INTRAVENOUS | Status: AC
  Filled 2014-11-01: qty 1

## 2014-11-01 MED ORDER — IBUPROFEN 800 MG PO TABS
800.0000 mg | ORAL_TABLET | Freq: Three times a day (TID) | ORAL | Status: DC | PRN
  Administered 2014-11-02: 800 mg via ORAL
  Filled 2014-11-01: qty 1

## 2014-11-01 MED ORDER — ROCURONIUM BROMIDE 100 MG/10ML IV SOLN
INTRAVENOUS | Status: DC | PRN
  Administered 2014-11-01: 10 mg via INTRAVENOUS
  Administered 2014-11-01: 60 mg via INTRAVENOUS

## 2014-11-01 MED ORDER — PROMETHAZINE HCL 25 MG/ML IJ SOLN: 6.2500 mg | INTRAMUSCULAR | Status: DC | PRN

## 2014-11-01 MED ORDER — CIPROFLOXACIN IN D5W 400 MG/200ML IV SOLN
INTRAVENOUS | Status: DC | PRN
  Administered 2014-11-01: 400 mg via INTRAVENOUS

## 2014-11-01 MED ORDER — DEXAMETHASONE SODIUM PHOSPHATE 10 MG/ML IJ SOLN
INTRAMUSCULAR | Status: DC | PRN
  Administered 2014-11-01: 10 mg via INTRAVENOUS

## 2014-11-01 MED ORDER — PROPOFOL 10 MG/ML IV BOLUS
INTRAVENOUS | Status: AC
  Filled 2014-11-01: qty 20

## 2014-11-01 MED ORDER — KCL IN DEXTROSE-NACL 20-5-0.45 MEQ/L-%-% IV SOLN
INTRAVENOUS | Status: AC
  Filled 2014-11-01: qty 1000

## 2014-11-01 MED ORDER — DEXAMETHASONE SODIUM PHOSPHATE 10 MG/ML IJ SOLN
INTRAMUSCULAR | Status: AC
  Filled 2014-11-01: qty 1

## 2014-11-01 MED ORDER — ROCURONIUM BROMIDE 100 MG/10ML IV SOLN
INTRAVENOUS | Status: AC
  Filled 2014-11-01: qty 1

## 2014-11-01 MED ORDER — ENOXAPARIN SODIUM 40 MG/0.4ML ~~LOC~~ SOLN
40.0000 mg | SUBCUTANEOUS | Status: AC
  Administered 2014-11-01: 40 mg via SUBCUTANEOUS
  Filled 2014-11-01: qty 0.4

## 2014-11-01 MED ORDER — MEPERIDINE HCL 50 MG/ML IJ SOLN: 6.2500 mg | INTRAMUSCULAR | Status: DC | PRN

## 2014-11-01 MED ORDER — CLINDAMYCIN PHOSPHATE 900 MG/50ML IV SOLN
INTRAVENOUS | Status: AC
  Filled 2014-11-01: qty 50

## 2014-11-01 MED ORDER — CLINDAMYCIN PHOSPHATE 900 MG/50ML IV SOLN
900.0000 mg | INTRAVENOUS | Status: AC
  Administered 2014-11-01: 900 mg via INTRAVENOUS

## 2014-11-01 MED ORDER — LACTATED RINGERS IV SOLN: INTRAVENOUS | Status: DC

## 2014-11-01 MED ORDER — CIPROFLOXACIN IN D5W 400 MG/200ML IV SOLN: 400.0000 mg | INTRAVENOUS | Status: DC

## 2014-11-01 MED ORDER — OXYCODONE-ACETAMINOPHEN 5-325 MG PO TABS
1.0000 | ORAL_TABLET | ORAL | Status: DC | PRN
  Administered 2014-11-01 – 2014-11-02 (×4): 1 via ORAL
  Filled 2014-11-01: qty 1
  Filled 2014-11-01: qty 2
  Filled 2014-11-01 (×2): qty 1

## 2014-11-01 MED ORDER — HYDROMORPHONE HCL 1 MG/ML IJ SOLN: 0.2500 mg | INTRAMUSCULAR | Status: DC | PRN

## 2014-11-01 MED ORDER — GLYCOPYRROLATE 0.2 MG/ML IJ SOLN
INTRAMUSCULAR | Status: AC
  Filled 2014-11-01: qty 3

## 2014-11-01 MED ORDER — SUFENTANIL CITRATE 50 MCG/ML IV SOLN
INTRAVENOUS | Status: DC | PRN
  Administered 2014-11-01 (×3): 10 ug via INTRAVENOUS

## 2014-11-01 MED ORDER — ONDANSETRON HCL 4 MG/2ML IJ SOLN
INTRAMUSCULAR | Status: AC
  Filled 2014-11-01: qty 2

## 2014-11-01 MED ORDER — NEOSTIGMINE METHYLSULFATE 10 MG/10ML IV SOLN
INTRAVENOUS | Status: DC | PRN
  Administered 2014-11-01: 4 mg via INTRAVENOUS

## 2014-11-01 MED ORDER — KETOROLAC TROMETHAMINE 15 MG/ML IJ SOLN
15.0000 mg | Freq: Four times a day (QID) | INTRAMUSCULAR | Status: DC | PRN
  Administered 2014-11-01 – 2014-11-02 (×2): 15 mg via INTRAVENOUS
  Filled 2014-11-01 (×2): qty 1

## 2014-11-01 MED ORDER — MIDAZOLAM HCL 2 MG/2ML IJ SOLN
INTRAMUSCULAR | Status: AC
  Filled 2014-11-01: qty 2

## 2014-11-01 MED ORDER — ONDANSETRON HCL 4 MG/2ML IJ SOLN: 4.0000 mg | Freq: Four times a day (QID) | INTRAMUSCULAR | Status: DC | PRN

## 2014-11-01 MED ORDER — ONDANSETRON HCL 4 MG/2ML IJ SOLN
INTRAMUSCULAR | Status: DC | PRN
  Administered 2014-11-01: 4 mg via INTRAVENOUS

## 2014-11-01 MED ORDER — ESTRADIOL 0.05 MG/24HR TD PTWK
0.0500 mg | MEDICATED_PATCH | TRANSDERMAL | Status: DC
  Filled 2014-11-01 (×2): qty 1

## 2014-11-01 MED ORDER — PROPOFOL 10 MG/ML IV BOLUS
INTRAVENOUS | Status: DC | PRN
  Administered 2014-11-01: 50 mg via INTRAVENOUS
  Administered 2014-11-01: 150 mg via INTRAVENOUS

## 2014-11-01 MED ORDER — LACTATED RINGERS IV SOLN
INTRAVENOUS | Status: DC | PRN
  Administered 2014-11-01 (×2): via INTRAVENOUS

## 2014-11-01 MED ORDER — LACTATED RINGERS IR SOLN
Status: DC | PRN
  Administered 2014-11-01: 1000 mL

## 2014-11-01 MED ORDER — ONDANSETRON HCL 4 MG PO TABS: 4.0000 mg | ORAL_TABLET | Freq: Four times a day (QID) | ORAL | Status: DC | PRN

## 2014-11-01 MED ORDER — GLYCOPYRROLATE 0.2 MG/ML IJ SOLN
INTRAMUSCULAR | Status: DC | PRN
  Administered 2014-11-01: .8 mg via INTRAVENOUS

## 2014-11-01 MED ORDER — HYDROMORPHONE HCL 1 MG/ML IJ SOLN
0.2000 mg | INTRAMUSCULAR | Status: AC | PRN
  Administered 2014-11-01: 0.4 mg via INTRAVENOUS
  Administered 2014-11-01: 0.2 mg via INTRAVENOUS
  Filled 2014-11-01 (×2): qty 1

## 2014-11-01 MED ORDER — MIDAZOLAM HCL 5 MG/5ML IJ SOLN
INTRAMUSCULAR | Status: DC | PRN
  Administered 2014-11-01: 2 mg via INTRAVENOUS

## 2014-11-01 MED ORDER — LIDOCAINE HCL (CARDIAC) 20 MG/ML IV SOLN
INTRAVENOUS | Status: AC
  Filled 2014-11-01: qty 5

## 2014-11-01 MED ORDER — STERILE WATER FOR IRRIGATION IR SOLN
Status: DC | PRN
  Administered 2014-11-01: 1500 mL

## 2014-11-01 MED ORDER — CIPROFLOXACIN IN D5W 400 MG/200ML IV SOLN
INTRAVENOUS | Status: AC
  Filled 2014-11-01: qty 200

## 2014-11-01 MED ORDER — ENOXAPARIN SODIUM 40 MG/0.4ML ~~LOC~~ SOLN
40.0000 mg | SUBCUTANEOUS | Status: DC
  Administered 2014-11-02: 40 mg via SUBCUTANEOUS
  Filled 2014-11-01 (×2): qty 0.4

## 2014-11-01 MED ORDER — SUFENTANIL CITRATE 50 MCG/ML IV SOLN
INTRAVENOUS | Status: AC
  Filled 2014-11-01: qty 1

## 2014-11-01 MED ORDER — KETOROLAC TROMETHAMINE 30 MG/ML IJ SOLN: 30.0000 mg | Freq: Once | INTRAMUSCULAR | Status: DC | PRN

## 2014-11-01 MED ORDER — KCL IN DEXTROSE-NACL 20-5-0.45 MEQ/L-%-% IV SOLN
INTRAVENOUS | Status: DC
  Administered 2014-11-01 – 2014-11-02 (×2): via INTRAVENOUS
  Filled 2014-11-01 (×2): qty 1000

## 2014-11-01 MED ORDER — ESTRADIOL 0.05 MG/24HR TD PTTW: 0.0500 mg | MEDICATED_PATCH | TRANSDERMAL | Status: DC

## 2014-11-01 MED ORDER — LIDOCAINE HCL (CARDIAC) 20 MG/ML IV SOLN
INTRAVENOUS | Status: DC | PRN
  Administered 2014-11-01: 75 mg via INTRAVENOUS
  Administered 2014-11-01: 25 mg via INTRATRACHEAL

## 2014-11-01 SURGICAL SUPPLY — 56 items
APL ESCP 34 STRL LF DISP (HEMOSTASIS) ×1
APPLICATOR SURGIFLO ENDO (HEMOSTASIS) ×1 IMPLANT
BAG SPEC RTRVL LRG 6X4 10 (ENDOMECHANICALS)
CABLE HIGH FREQUENCY MONO STRZ (ELECTRODE) ×2 IMPLANT
CHLORAPREP W/TINT 26ML (MISCELLANEOUS) ×2 IMPLANT
CORDS BIPOLAR (ELECTRODE) ×2 IMPLANT
COVER SURGICAL LIGHT HANDLE (MISCELLANEOUS) ×2 IMPLANT
COVER TIP SHEARS 8 DVNC (MISCELLANEOUS) ×1 IMPLANT
COVER TIP SHEARS 8MM DA VINCI (MISCELLANEOUS) ×1
DRAPE SHEET LG 3/4 BI-LAMINATE (DRAPES) ×4 IMPLANT
DRAPE SURG IRRIG POUCH 19X23 (DRAPES) ×2 IMPLANT
DRAPE TABLE BACK 44X90 PK DISP (DRAPES) ×4 IMPLANT
DRAPE WARM FLUID 44X44 (DRAPE) ×2 IMPLANT
DRSG TEGADERM 6X8 (GAUZE/BANDAGES/DRESSINGS) ×2 IMPLANT
ELECT REM PT RETURN 9FT ADLT (ELECTROSURGICAL) ×2
ELECTRODE REM PT RTRN 9FT ADLT (ELECTROSURGICAL) ×1 IMPLANT
GLOVE BIO SURGEON STRL SZ 6 (GLOVE) ×6 IMPLANT
GLOVE BIO SURGEON STRL SZ 6.5 (GLOVE) ×4 IMPLANT
GOWN STRL REUS W/ TWL LRG LVL3 (GOWN DISPOSABLE) ×3 IMPLANT
GOWN STRL REUS W/TWL LRG LVL3 (GOWN DISPOSABLE) ×6
HOLDER FOLEY CATH W/STRAP (MISCELLANEOUS) ×2 IMPLANT
KIT ACCESSORY DA VINCI DISP (KITS) ×1
KIT ACCESSORY DVNC DISP (KITS) ×1 IMPLANT
KIT BASIN OR (CUSTOM PROCEDURE TRAY) ×2 IMPLANT
KIT PROCEDURE DA VINCI SI (MISCELLANEOUS) ×1
KIT PROCEDURE DVNC SI (MISCELLANEOUS) IMPLANT
LIQUID BAND (GAUZE/BANDAGES/DRESSINGS) ×2 IMPLANT
MANIPULATOR UTERINE 4.5 ZUMI (MISCELLANEOUS) ×2 IMPLANT
NDL SAFETY ECLIPSE 18X1.5 (NEEDLE) IMPLANT
NDL SPNL 18GX3.5 QUINCKE PK (NEEDLE) IMPLANT
NEEDLE HYPO 18GX1.5 SHARP (NEEDLE) ×2
NEEDLE SPNL 18GX3.5 QUINCKE PK (NEEDLE) IMPLANT
OCCLUDER COLPOPNEUMO (BALLOONS) ×2 IMPLANT
PEN SKIN MARKING BROAD (MISCELLANEOUS) ×2 IMPLANT
PORT ACCESS TROCAR AIRSEAL 12 (TROCAR) IMPLANT
PORT ACCESS TROCAR AIRSEAL 5M (TROCAR) ×1
POUCH SPECIMEN RETRIEVAL 10MM (ENDOMECHANICALS) IMPLANT
SET TRI-LUMEN FLTR TB AIRSEAL (TUBING) ×2 IMPLANT
SET TUBE IRRIG SUCTION NO TIP (IRRIGATION / IRRIGATOR) ×2 IMPLANT
SHEET LAVH (DRAPES) ×2 IMPLANT
SOLUTION ELECTROLUBE (MISCELLANEOUS) ×2 IMPLANT
SURGIFLO W/THROMBIN 8M KIT (HEMOSTASIS) ×1 IMPLANT
SUT VIC AB 0 CT1 27 (SUTURE) ×2
SUT VIC AB 0 CT1 27XBRD ANTBC (SUTURE) ×1 IMPLANT
SUT VIC AB 4-0 PS2 27 (SUTURE) ×4 IMPLANT
SYR 50ML LL SCALE MARK (SYRINGE) ×2 IMPLANT
SYRINGE 10CC LL (SYRINGE) ×1 IMPLANT
TOWEL OR 17X26 10 PK STRL BLUE (TOWEL DISPOSABLE) ×4 IMPLANT
TOWEL OR NON WOVEN STRL DISP B (DISPOSABLE) ×2 IMPLANT
TRAP SPECIMEN MUCOUS 40CC (MISCELLANEOUS) IMPLANT
TRAY FOLEY W/METER SILVER 14FR (SET/KITS/TRAYS/PACK) ×2 IMPLANT
TRAY LAPAROSCOPIC (CUSTOM PROCEDURE TRAY) ×2 IMPLANT
TROCAR 12M 150ML BLUNT (TROCAR) ×2 IMPLANT
TROCAR BLADELESS OPT 5 100 (ENDOMECHANICALS) ×2 IMPLANT
TROCAR PORT AIRSEAL 5X120 (TROCAR) IMPLANT
WATER STERILE IRR 1500ML POUR (IV SOLUTION) ×2 IMPLANT

## 2014-11-01 NOTE — Anesthesia Preprocedure Evaluation (Addendum)
Anesthesia Evaluation  Patient identified by MRN, date of birth, ID band Patient awake    Reviewed: Allergy & Precautions, NPO status , Patient's Chart, lab work & pertinent test results  Airway Mallampati: II  TM Distance: >3 FB Neck ROM: Full    Dental no notable dental hx.    Pulmonary neg pulmonary ROS, Current Smoker,  breath sounds clear to auscultation  Pulmonary exam normal       Cardiovascular negative cardio ROS Normal cardiovascular exam+ Valvular Problems/Murmurs Rhythm:Regular Rate:Normal     Neuro/Psych  Headaches, negative psych ROS   GI/Hepatic negative GI ROS, Neg liver ROS,   Endo/Other  negative endocrine ROS  Renal/GU negative Renal ROS     Musculoskeletal negative musculoskeletal ROS (+)   Abdominal   Peds  Hematology  (+) anemia ,   Anesthesia Other Findings   Reproductive/Obstetrics negative OB ROS                            Anesthesia Physical Anesthesia Plan  ASA: II  Anesthesia Plan: General   Post-op Pain Management:    Induction: Intravenous  Airway Management Planned: Oral ETT  Additional Equipment:   Intra-op Plan:   Post-operative Plan: Extubation in OR  Informed Consent: I have reviewed the patients History and Physical, chart, labs and discussed the procedure including the risks, benefits and alternatives for the proposed anesthesia with the patient or authorized representative who has indicated his/her understanding and acceptance.   Dental advisory given  Plan Discussed with: CRNA  Anesthesia Plan Comments:         Anesthesia Quick Evaluation

## 2014-11-01 NOTE — Progress Notes (Signed)
Patient complaining of  adominal pain and pressure. Low urinary output noted. Urinary catheter intact. Bladder scan performed x 4 with 2 different bladder scanners each revealed  0 ml.  Patient SBP 90's, notified Joylene John, NP of patients condition. Will continue to monitor patient and notify MD as needed.

## 2014-11-01 NOTE — Anesthesia Postprocedure Evaluation (Signed)
Anesthesia Post Note  Patient: Karina Briggs  Procedure(s) Performed: Procedure(s) (LRB): ROBOTIC ASSISTED TOTAL LAPARSCOPIC  HYSTERECTOMY WITH BILATERAL SALPINGO [place] NODE MAPPING, LYMPHADENECTOMY (Bilateral)  Anesthesia type: General  Patient location: PACU  Post pain: Pain level controlled  Post assessment: Post-op Vital signs reviewed  Last Vitals: BP 108/62 mmHg  Pulse 55  Temp(Src) 36.6 C (Oral)  Resp 18  Ht 5\' 1"  (1.549 m)  Wt 145 lb (65.772 kg)  BMI 27.41 kg/m2  SpO2 100%  Post vital signs: Reviewed  Level of consciousness: sedated  Complications: No apparent anesthesia complications

## 2014-11-01 NOTE — H&P (Signed)
CC:  Chief Complaint  Patient presents with  . endometrial cancer    New consult    Assessment/Plan:  Ms. Karina Briggs is a 54 y.o. year old with grade 1 endometrioid endometrial cancer, clinical stage I.   A detailed discussion was held with the patient and her family with regard to to her endometrial cancer diagnosis. We discussed the standard management options for uterine cancer which includes surgery followed possibly by adjuvant therapy depending on the results of surgery. The options for surgical management include a hysterectomy and removal of the tubes and ovaries and lymph node sampling. A minimally invasive approach including a robotic hysterectomy or laparoscopic hysterectomy have benefits including shorter hospital stay, recovery time and better wound healing. The alternative approach is an open hysterectomy. The patient has been counseled about these surgical options and the risks of surgery in general including infection, bleeding, damage to surrounding structures (including bowel, bladder, ureters, nerves or vessels), and the postoperative risks of PE/ DVT, and lymphedema. I extensively reviewed the additional risks of robotic hysterectomy including possible need for conversion to open laparotomy. I discussed positioning during surgery of trendelenberg and risks of minor facial swelling and care we take in preoperative positioning. After counseling and consideration of her options, she desires to proceed with robotic assisted total hysterectomy, BSO, sentinel lymph node mapping, possible lymphadenectomy.   She will be seen by anesthesia for preoperative clearance and discussion of postoperative pain management. She was given the opportunity to ask questions, which were answered to her satisfaction, and she is agreement with the above mentioned plan of care.   HPI: Karina Briggs is a very pleasant G2 P2 who is seen in consultation at the request of Dr. Nori Riis for grade 1  endometrioid endometrial adenocarcinoma. The patient has been on post menopausal hormone replacement therapy with asked region and every 3 monthly progesterone for approximately one year. In that time she felt to have a normal withdrawal bleeding on progesterone however had regular vaginal spotting. This prompted Dr. Milta Deiters to perform an ultrasound evaluation of the uterus on 08/24/2014. This revealed a 9.5 x 5.3 x 5.7 cm uterus with a 56 mm endometrial stripe. Ovaries were normal. An office endometrial sampling with pelvic biopsy took place on 08/24/2014 and revealed grade 1 adenocarcinoma, endometrioid type.   She is overweight but not obese, with otherwise no major risk factors for endometrial cancer. She has had no prior abdominal surgeries. She has no major medical comorbidities. She has no significant family history for cancer.  Current Meds:  Outpatient Encounter Prescriptions as of 09/26/2014  Medication Sig  . estradiol (MINIVELLE) 0.05 MG/24HR patch Place 1 patch onto the skin 2 (two) times a week.  . loratadine (CLARITIN) 10 MG tablet Take 10 mg by mouth daily as needed.   . [DISCONTINUED] aspirin EC 81 MG tablet Take 81 mg by mouth daily.   No facility-administered encounter medications on file as of 09/26/2014.    Allergy:  Allergies  Allergen Reactions  . Penicillins     Childhood reaction    Social Hx:  History   Social History  . Marital Status: Married    Spouse Name: N/A  . Number of Children: N/A  . Years of Education: N/A   Occupational History  . Not on file.   Social History Main Topics  . Smoking status: Current Some Day Smoker    Types: Cigarettes  . Smokeless tobacco: Never Used  . Alcohol Use: No  . Drug Use:  No  . Sexual Activity: Not Currently    Birth Control/ Protection: Post-menopausal   Other Topics Concern  . Not on file   Social History Narrative    Past  Surgical Hx:  Past Surgical History  Procedure Laterality Date  . Dilation and curettage, diagnostic / therapeutic  1990    Past Medical Hx:  Past Medical History  Diagnosis Date  . Dizziness - light-headed 02/2010    treated by ENT and neurology    Past Gynecological History: SVD x 2 No LMP recorded. Patient is not currently having periods (Reason: Perimenopausal).  Family Hx:  Family History  Problem Relation Age of Onset  . Hypertension Mother   . Arthritis Mother   . Alcohol abuse Neg Hx   . Cancer Neg Hx   . COPD Neg Hx   . Depression Neg Hx   . Diabetes Neg Hx   . Early death Neg Hx   . Heart disease Neg Hx   . Hyperlipidemia Neg Hx   . Stroke Neg Hx     Review of Systems:  Constitutional  Feels well,  ENT Normal appearing ears and nares bilaterally Skin/Breast  No rash, sores, jaundice, itching, dryness Cardiovascular  No chest pain, shortness of breath, or edema  Pulmonary  No cough or wheeze.  Gastro Intestinal  No nausea, vomitting, or diarrhoea. No bright red blood per rectum, no abdominal pain, change in bowel movement, or constipation.  Genito Urinary  No frequency, urgency, dysuria, see HPI Musculo Skeletal  No myalgia, arthralgia, joint swelling or pain  Neurologic  No weakness, numbness, change in gait,  Psychology  No depression, anxiety, insomnia.   Vitals: Blood pressure 121/67, pulse 60, temperature 98 F (36.7 C), temperature source Oral, resp. rate 18, height 5\' 1"  (1.549 m), weight 143 lb 4.8 oz (65 kg), SpO2 100 %.  Physical Exam: WD in NAD Neck  Supple NROM, without any enlargements.  Lymph Node Survey No cervical supraclavicular or inguinal adenopathy Cardiovascular  Pulse normal rate, regularity and rhythm. S1 and S2 normal.  Lungs  Clear to auscultation bilateraly, without wheezes/crackles/rhonchi. Good air movement.  Skin  No  rash/lesions/breakdown  Psychiatry  Alert and oriented to person, place, and time  Abdomen  Normoactive bowel sounds, abdomen soft, non-tender and obese without evidence of hernia.  Back No CVA tenderness Genito Urinary  Vulva/vagina: Normal external female genitalia. No lesions. No discharge or bleeding. Bladder/urethra: No lesions or masses, well supported bladder Vagina: normal in appearance Cervix: Normal appearing, no lesions. Uterus: Small, mobile, no parametrial involvement or nodularity. Adnexa: no palpable masses. Rectal  Good tone, no masses no cul de sac nodularity.  Extremities  No bilateral cyanosis, clubbing or edema.   Donaciano Eva, MD

## 2014-11-01 NOTE — Op Note (Signed)
OPERATIVE NOTE 11/01/14 Surgeon: Donaciano Eva   Assistants: Dr Lahoma Crocker (an MD assistant was necessary for tissue manipulation, management of robotic instrumentation, retraction and positioning due to the complexity of the case and hospital policies).   Anesthesia: General endotracheal anesthesia  ASA Class: 2   Pre-operative Diagnosis: grade 1 endometrial cancer  Post-operative Diagnosis: same  Operation: Robotic-assisted laparoscopic hysterectomy with bilateral salpingoophorectomy, sentinel lymph node mapping and sentinel lymph node biopsy  Surgeon: Donaciano Eva  Assistant Surgeon: Lahoma Crocker MD  Anesthesia: GET  Urine Output: 200  Operative Findings:  : normal apperaing uterus tubes and ovaries, no suspicious appearing nodes  Estimated Blood Loss:  less than 100 mL      Total IV Fluids: 1,000 ml         Specimens: uterus, cervix, bilateral tubes and ovaries, right proximal obturator SLN, right deep obturator SLN, left external iliac SLN, left obturator SLN, washings         Complications:  None; patient tolerated the procedure well.         Disposition: PACU - hemodynamically stable.  Procedure Details  The patient was seen in the Holding Room. The risks, benefits, complications, treatment options, and expected outcomes were discussed with the patient.  The patient concurred with the proposed plan, giving informed consent.  The site of surgery properly noted/marked. The patient was identified as Karina Briggs and the procedure verified as a Robotic-assisted hysterectomy with bilateral salpingo oophorectomy and SLN biopsy. A Time Out was held and the above information confirmed.  After induction of anesthesia, the patient was draped and prepped in the usual sterile manner. Pt was placed in supine position after anesthesia and draped and prepped in the usual sterile manner. The abdominal drape was placed after the CholoraPrep had been allowed  to dry for 3 minutes.  Her arms were tucked to her side with all appropriate precautions.  The acromiums were stabilized with shoulder blocks.  The patient was placed in the semi-lithotomy position in [place].  The perineum was prepped with Betadine.  Foley catheter was placed.  A sterile speculum was placed in the vagina.  The cervix was grasped with a single-tooth tenaculum. 1mg  total of ICG was injected into the cervical stroma at 2 and 9 o'clock at a 76mm depth (concentration 0..5mg /ml).  The cervix was then dilated with [facility] dilators.  The ZUMI uterine manipulator with a medium colpotomizer ring was placed without difficulty.  A pneum occluder balloon was placed over the manipulator.  A second time-out was performed.  OG tube placement was confirmed and to suction.    Procedure:  The patient was brought to the operating room where general anesthesia was administered with no complications.  Patient positioning and ICG injection took place as above.  Next, a 5 mm skin incision was made 1 cm below the subcostal margin in the midclavicular line.  The 5 mm Optiview port and scope was used for direct entry.  Opening pressure was under 10 mm CO2.  The abdomen was insufflated and the findings were noted as above.   At this point and all points during the procedure, the patient's intra-abdominal pressure did not exceed 15 mmHg. Next, a 10 mm skin incision was made 2cm above the umbilicus and a right and left port was placed about 10 cm lateral to the robot port on the right and left side.  A fourth arm was placed in the left lower quadrant 2 cm above and  superior and medial to the anterior superior iliac spine.  All ports were placed under direct visualization.  The patient was placed in steep Trendelenburg.  Bowel was away into the upper abdomen.  The robot was docked in the normal manner.  The right and left peritoneum were opened parallel to the IP ligament to open the retroperitoneal spaces bilaterally.  The SLN mapping was performed in bilateral pelvic basins. It was a successful bilateral mapping. The para rectal and paravesical spaces were opened up. Lymphatic channels were identified travelling to the following visualized sentinel lymph node's: right proximal and deep (more distal) obturator SLN's and left external iliac and obturator SLN. These SLN's were separated from their surrounding lymphatic tissue, removed and sent for permanent pathology.  The hysterectomy was started after the round ligament on the right side was incised and the retroperitoneum was entered and the pararectal space was developed.  The ureter was noted to be on the medial leaf of the broad ligament.  The peritoneum above the ureter was incised and stretched and the infundibulopelvic ligament was skeletonized, cauterized and cut.  The posterior peritoneum was taken down to the level of the KOH ring.  The anterior peritoneum was also taken down.  The bladder flap was created to the level of the KOH ring.  The uterine artery on the right side was skeletonized, cauterized and cut in the normal manner.  A similar procedure was performed on the left.  The colpotomy was made and the uterus, cervix, bilateral ovaries and tubes were amputated and delivered through the vagina.  Pedicles were inspected and excellent hemostasis was achieved.    The colpotomy at the vaginal cuff was closed with Vicryl on a CT1 needle in a running manner.  Irrigation was used and excellent hemostasis was achieved.  At this point in the procedure was completed.  Robotic instruments were removed under direct visulaization.  The robot was undocked. The 10 mm ports were closed with Vicryl on a UR-5 needle and the fascia was closed with 0 Vicryl on a UR-5 needle.  The skin was closed with 4-0 Vicryl in a subcuticular manner.  Dermabond was applied.  Sponge, lap and needle counts correct x 2.  The patient was taken to the recovery room in stable condition.  The vagina  was swabbed with  minimal bleeding noted.   All instrument and needle counts were correct x  3.   The patient was transferred to the recovery room in a stable condition.

## 2014-11-01 NOTE — Anesthesia Procedure Notes (Signed)
Procedure Name: Intubation Date/Time: 11/01/2014 7:46 AM Performed by: Lissa Morales Pre-anesthesia Checklist: Patient identified, Emergency Drugs available, Suction available and Patient being monitored Patient Re-evaluated:Patient Re-evaluated prior to inductionOxygen Delivery Method: Circle System Utilized Preoxygenation: Pre-oxygenation with 100% oxygen Intubation Type: IV induction Ventilation: Mask ventilation without difficulty Laryngoscope Size: Mac and 3 Grade View: Grade II Tube type: Oral (In tubated by paremedic student,Katie) Number of attempts: 2 Airway Equipment and Method: Stylet and Oral airway Placement Confirmation: ETT inserted through vocal cords under direct vision,  positive ETCO2 and breath sounds checked- equal and bilateral Secured at: 21 cm Tube secured with: Tape Dental Injury: Teeth and Oropharynx as per pre-operative assessment

## 2014-11-01 NOTE — Transfer of Care (Signed)
Immediate Anesthesia Transfer of Care Note  Patient: Karina Briggs  Procedure(s) Performed: Procedure(s): ROBOTIC ASSISTED TOTAL LAPARSCOPIC  HYSTERECTOMY WITH BILATERAL SALPINGO [place] NODE MAPPING, LYMPHADENECTOMY (Bilateral)  Patient Location: PACU  Anesthesia Type:General  Level of Consciousness: awake, alert , oriented and patient cooperative  Airway & Oxygen Therapy: Patient Spontanous Breathing and Patient connected to face mask oxygen  Post-op Assessment: Report given to RN, Post -op Vital signs reviewed and stable and Patient moving all extremities X 4  Post vital signs: stable  Last Vitals:  Filed Vitals:   11/01/14 1015  BP: 97/65  Pulse: 79  Temp: 36.5 C  Resp: 12    Complications: No apparent anesthesia complications

## 2014-11-01 NOTE — Progress Notes (Signed)
Patient assessed at the bedside.  Alert, oriented, in no distress.  Husband at the bedside at well.  Stating the lower abdominal pressure has improved after manipulation of the foley by the RN.  Clear, yellow urine draining in the tubing.  Foley assessed and functioning.  Questioned about symptoms of hypotension and denies dizziness, weakness.  Husband stating her BP always runs on the lower end.  No concerns voiced.  Advised to notify her RN for any concerns or questions.

## 2014-11-02 ENCOUNTER — Encounter (HOSPITAL_COMMUNITY): Payer: Self-pay | Admitting: Gynecologic Oncology

## 2014-11-02 DIAGNOSIS — C541 Malignant neoplasm of endometrium: Secondary | ICD-10-CM | POA: Diagnosis not present

## 2014-11-02 LAB — BASIC METABOLIC PANEL
ANION GAP: 5 (ref 5–15)
BUN: 12 mg/dL (ref 6–20)
CALCIUM: 8.9 mg/dL (ref 8.9–10.3)
CO2: 28 mmol/L (ref 22–32)
Chloride: 105 mmol/L (ref 101–111)
Creatinine, Ser: 0.94 mg/dL (ref 0.44–1.00)
GFR calc Af Amer: >60 mL/min (ref >60)
Glucose, Bld: 137 mg/dL — ABNORMAL HIGH (ref 65–99)
Potassium: 5 mmol/L (ref 3.5–5.1)
Sodium: 138 mmol/L (ref 135–145)

## 2014-11-02 LAB — CBC
HCT: 35.2 % — ABNORMAL LOW (ref 36.0–46.0)
HEMOGLOBIN: 11.5 g/dL — AB (ref 12.0–15.0)
MCH: 30.9 pg (ref 26.0–34.0)
MCHC: 32.7 g/dL (ref 30.0–36.0)
MCV: 94.6 fL (ref 78.0–100.0)
PLATELETS: 186 10*3/uL (ref 150–400)
RBC: 3.72 MIL/uL — ABNORMAL LOW (ref 3.87–5.11)
RDW: 12.9 % (ref 11.5–15.5)
WBC: 13.1 10*3/uL — ABNORMAL HIGH (ref 4.0–10.5)

## 2014-11-02 MED ORDER — OXYCODONE-ACETAMINOPHEN 5-325 MG PO TABS: 1.0000 | ORAL_TABLET | ORAL | Status: DC | PRN

## 2014-11-02 NOTE — Discharge Instructions (Signed)
11/02/2014  Return to work: 4-6 weeks if applicable  Activity: 1. Be up and out of the bed during the day.  Take a nap if needed.  You may walk up steps but be careful and use the hand rail.  Stair climbing will tire you more than you think, you may need to stop part way and rest.   2. No lifting or straining for 6 weeks.  3. No driving for 1 week(s).  Do not drive if you are taking narcotic pain medicine.  4. Shower daily.  Use soap and water on your incision and pat dry; don't rub.  No tub baths until cleared by your surgeon.   5. No sexual activity and nothing in the vagina for 8 weeks.  Diet: 1. Low sodium Heart Healthy Diet is recommended.  2. It is safe to use a laxative, such as Miralax or Colace, if you have difficulty moving your bowels.   Wound Care: 1. Keep clean and dry.  Shower daily.  2. You may experience a small amount of clear drainage from your incisions.    Reasons to call the Doctor:  Fever - Oral temperature greater than 100.4 degrees Fahrenheit  Foul-smelling vaginal discharge  Difficulty urinating  Nausea and vomiting  Increased pain at the site of the incision that is unrelieved with pain medicine.  Difficulty breathing with or without chest pain  New calf pain especially if only on one side  Sudden, continuing increased vaginal bleeding with or without clots.   Contacts: For questions or concerns you should contact:  Dr. [name] at [phone]  Joylene John, NP at [phone]  After Hours: call [phone] and ask for the GYN Oncologist on call  Acetaminophen; Oxycodone tablets What is this medicine? ACETAMINOPHEN; OXYCODONE (a set a MEE noe fen; ox i KOE done) is a pain reliever. It is used to treat mild to moderate pain. This medicine may be used for other purposes; ask your health care provider or pharmacist if you have questions. COMMON BRAND NAME(S): Endocet, Magnacet, Narvox, Percocet, Perloxx, Primalev, Primlev, Roxicet,  Xolox What should I tell my health care provider before I take this medicine? They need to know if you have any of these conditions: -brain tumor -Crohn's disease, inflammatory bowel disease, or ulcerative colitis -drug abuse or addiction -head injury -heart or circulation problems -if you often drink alcohol -kidney disease or problems going to the bathroom -liver disease -lung disease, asthma, or breathing problems -an unusual or allergic reaction to acetaminophen, oxycodone, other opioid analgesics, other medicines, foods, dyes, or preservatives -pregnant or trying to get pregnant -breast-feeding How should I use this medicine? Take this medicine by mouth with a full glass of water. Follow the directions on the prescription label. Take your medicine at regular intervals. Do not take your medicine more often than directed. Talk to your pediatrician regarding the use of this medicine in children. Special care may be needed. Patients over 1 years old may have a stronger reaction and need a smaller dose. Overdosage: If you think you have taken too much of this medicine contact a poison control center or emergency room at once. NOTE: This medicine is only for you. Do not share this medicine with others. What if I miss a dose? If you miss a dose, take it as soon as you can. If it is almost time for your next dose, take only that dose. Do not take double or extra doses. What may interact with this medicine? -alcohol -antihistamines -  barbiturates like amobarbital, butalbital, butabarbital, methohexital, pentobarbital, phenobarbital, thiopental, and secobarbital -benztropine -drugs for bladder problems like solifenacin, trospium, oxybutynin, tolterodine, hyoscyamine, and methscopolamine -drugs for breathing problems like ipratropium and tiotropium -drugs for certain stomach or intestine problems like propantheline, homatropine methylbromide, glycopyrrolate, atropine, belladonna, and  dicyclomine -general anesthetics like etomidate, ketamine, nitrous oxide, propofol, desflurane, enflurane, halothane, isoflurane, and sevoflurane -medicines for depression, anxiety, or psychotic disturbances -medicines for sleep -muscle relaxants -naltrexone -narcotic medicines (opiates) for pain -phenothiazines like perphenazine, thioridazine, chlorpromazine, mesoridazine, fluphenazine, prochlorperazine, promazine, and trifluoperazine -scopolamine -tramadol -trihexyphenidyl This list may not describe all possible interactions. Give your health care provider a list of all the medicines, herbs, non-prescription drugs, or dietary supplements you use. Also tell them if you smoke, drink alcohol, or use illegal drugs. Some items may interact with your medicine. What should I watch for while using this medicine? Tell your doctor or health care professional if your pain does not go away, if it gets worse, or if you have new or a different type of pain. You may develop tolerance to the medicine. Tolerance means that you will need a higher dose of the medication for pain relief. Tolerance is normal and is expected if you take this medicine for a long time. Do not suddenly stop taking your medicine because you may develop a severe reaction. Your body becomes used to the medicine. This does NOT mean you are addicted. Addiction is a behavior related to getting and using a drug for a non-medical reason. If you have pain, you have a medical reason to take pain medicine. Your doctor will tell you how much medicine to take. If your doctor wants you to stop the medicine, the dose will be slowly lowered over time to avoid any side effects. You may get drowsy or dizzy. Do not drive, use machinery, or do anything that needs mental alertness until you know how this medicine affects you. Do not stand or sit up quickly, especially if you are an older patient. This reduces the risk of dizzy or fainting spells. Alcohol may  interfere with the effect of this medicine. Avoid alcoholic drinks. There are different types of narcotic medicines (opiates) for pain. If you take more than one type at the same time, you may have more side effects. Give your health care provider a list of all medicines you use. Your doctor will tell you how much medicine to take. Do not take more medicine than directed. Call emergency for help if you have problems breathing. The medicine will cause constipation. Try to have a bowel movement at least every 2 to 3 days. If you do not have a bowel movement for 3 days, call your doctor or health care professional. Do not take Tylenol (acetaminophen) or medicines that have acetaminophen with this medicine. Too much acetaminophen can be very dangerous. Many nonprescription medicines contain acetaminophen. Always read the labels carefully to avoid taking more acetaminophen. What side effects may I notice from receiving this medicine? Side effects that you should report to your doctor or health care professional as soon as possible: -allergic reactions like skin rash, itching or hives, swelling of the face, lips, or tongue -breathing difficulties, wheezing -confusion -light headedness or fainting spells -severe stomach pain -unusually weak or tired -yellowing of the skin or the whites of the eyes Side effects that usually do not require medical attention (report to your doctor or health care professional if they continue or are bothersome): -dizziness -drowsiness -nausea -vomiting This list may  not describe all possible side effects. Call your doctor for medical advice about side effects. You may report side effects to FDA at 1-800-FDA-1088. Where should I keep my medicine? Keep out of the reach of children. This medicine can be abused. Keep your medicine in a safe place to protect it from theft. Do not share this medicine with anyone. Selling or giving away this medicine is dangerous and against the  law. Store at room temperature between 20 and 25 degrees C (68 and 77 degrees F). Keep container tightly closed. Protect from light. This medicine may cause accidental overdose and death if it is taken by other adults, children, or pets. Flush any unused medicine down the toilet to reduce the chance of harm. Do not use the medicine after the expiration date. NOTE: This sheet is a summary. It may not cover all possible information. If you have questions about this medicine, talk to your doctor, pharmacist, or health care provider.  2015, [employer]vier/Gold Standard. (2012-11-16 13:17:35)

## 2014-11-02 NOTE — Progress Notes (Signed)
Pt for discharge . Has voided qs, tolerated regular diet, ambulated independently in hall, and obtained adequate pain relief with po medicine. Discharge instructions discussed with patient and husband until all questions answered.

## 2014-11-02 NOTE — Discharge Summary (Signed)
Physician Discharge Summary  Patient ID: Karina Briggs MRN: [id number] DOB/AGE: 1961-02-18 54 y.o.  Admit date: 11/01/2014 Discharge date: 11/02/2014  Admission Diagnoses: Endometrial cancer  Discharge Diagnoses:  Principal Problem:   Endometrial cancer   Discharged Condition:  The patient is in good condition and stable for discharge.    Hospital Course: On 11/01/2014, the patient underwent the following: Procedure(s): ROBOTIC ASSISTED TOTAL LAPARSCOPIC  HYSTERECTOMY WITH BILATERAL SALPINGO Guys.  The postoperative course was uneventful.  She was discharged to home on postoperative day 1 tolerating a regular diet with minimal pain and voiding without difficulty.  Consults: None  Significant Diagnostic Studies: None  Treatments: surgery: see above  Discharge Exam: Blood pressure 102/55, pulse 64, temperature 98.2 F (36.8 C), temperature source Oral, resp. rate 18, height 5\' 1"  (1.549 m), weight 145 lb (65.772 kg), SpO2 100 %. General appearance: alert, cooperative and no distress Resp: clear to auscultation bilaterally Cardio: regular rate and rhythm, S1, S2 normal, no murmur, click, rub or gallop GI: soft, non-tender; bowel sounds normal; no masses,  no organomegaly Extremities: extremities normal, atraumatic, no cyanosis or edema Incision/Wound: Lap sites with derma-bond without erythema or drainage  Disposition: Home     Discharge Instructions    Call MD for:  difficulty breathing, headache or visual disturbances    Complete by:  As directed      Call MD for:  extreme fatigue    Complete by:  As directed      Call MD for:  hives    Complete by:  As directed      Call MD for:  persistant dizziness or light-headedness    Complete by:  As directed      Call MD for:  persistant nausea and vomiting    Complete by:  As directed      Call MD for:  redness, tenderness, or signs of infection (pain, swelling, redness, odor or  green/yellow discharge around incision site)    Complete by:  As directed      Call MD for:  severe uncontrolled pain    Complete by:  As directed      Call MD for:  temperature >100.4    Complete by:  As directed      Diet - low sodium heart healthy    Complete by:  As directed      Driving Restrictions    Complete by:  As directed   No driving for 1 week.  Do not take narcotics and drive.     Increase activity slowly    Complete by:  As directed      Lifting restrictions    Complete by:  As directed   No lifting greater than 10 lbs.     Sexual Activity Restrictions    Complete by:  As directed   No sexual activity, nothing in the vagina, for 8 weeks.            Medication List    TAKE these medications        MINIVELLE 0.05 MG/24HR patch  Generic drug:  estradiol  Place 1 patch onto the skin 2 (two) times a week.     oxyCODONE-acetaminophen 5-325 MG per tablet  Commonly known as:  PERCOCET/ROXICET  Take 1-2 tablets by mouth every 4 (four) hours as needed for moderate pain or severe pain (moderate to severe pain).       Follow-up Information    Follow up with Donaciano Eva,  MD On 11/28/2014.   Specialty:  Obstetrics and Gynecology   Why:  at the [facility] at 11:45am   Contact information:   [place] [phone]       Greater than thirty minutes were spend for face to face discharge instructions and discharge orders/summary in EPIC.   Signed: CROSS, MELISSA DEAL 11/02/2014, 9:57 AM     I saw and examined the patient and discussed her surgery and discharge. I agree with the above findings and note by APN Deal.  Donaciano Eva, MD

## 2014-11-04 ENCOUNTER — Telehealth: Payer: Self-pay | Admitting: Gynecologic Oncology

## 2014-11-04 NOTE — Telephone Encounter (Signed)
Post op telephone call to check patient status.  Patient describes expected post operative status.  Adequate PO intake reported.  Bowels and bladder functioning without difficulty.  Pain minimal.  Reportable signs and symptoms reviewed.  Follow up appt arranged.  Path discussed.  No concerns voiced.

## 2014-11-15 ENCOUNTER — Telehealth: Payer: Self-pay | Admitting: Gynecologic Oncology

## 2014-11-15 NOTE — Telephone Encounter (Signed)
Duplicate

## 2014-11-15 NOTE — Telephone Encounter (Signed)
Returned call to patient.  Asking about continuing estrogen patch.  Advised to taper herself off, going from twice weekly to once weekly then none.  Advised the use of her estrogen patch would be discussed with Dr. Denman George on Monday, Aug 15.  Advised to call for any questions or concerns and she would be notified of her recommendations.

## 2014-11-22 ENCOUNTER — Telehealth: Payer: Self-pay | Admitting: Gynecologic Oncology

## 2014-11-22 NOTE — Telephone Encounter (Signed)
Called to touch base with patient about her estrogen patch.  She stated she did not replace it after Saturday because she forgot but she is feeling fine with no symptoms.  Advised of Dr. Serita Grit recommendations to taper or wean off over the next few years if she still needed it for symptom relief.  Advised we would not recommend use after 5 years of natural menopause due to breast cancer risk.  She is to call the office if symptoms develop and will remain off of the estrogen patch at this time.  Advised to call for any questions or concerns.

## 2014-11-28 ENCOUNTER — Ambulatory Visit: Payer: BLUE CROSS/BLUE SHIELD | Attending: Gynecologic Oncology | Admitting: Gynecologic Oncology

## 2014-11-28 ENCOUNTER — Encounter: Payer: Self-pay | Admitting: Gynecologic Oncology

## 2014-11-28 VITALS — BP 113/73 | HR 83 | Temp 98.2°F | Resp 16 | Ht 61.0 in | Wt 146.0 lb

## 2014-11-28 DIAGNOSIS — C541 Malignant neoplasm of endometrium: Secondary | ICD-10-CM

## 2014-11-28 NOTE — Progress Notes (Signed)
POSTOPERATIVE FOLLOWUP  Assessment:    54 y.o. year old with Stage IA Grade 1 endometrioid endometrial cancer.   S/p robotic hysterectomy, BSO and pelvic SLN biopsy on 11/01/14. no LVSI, 25% myometrial invasion, negative sentinel lymph nodes.  Plan: 1) Pathology reports reviewed today 2) Treatment counseling - Very low risk (<5%) for recurrence given age, grade, depth of myometrial invasion and LVSI status. Multidisciplinary tumor board recommendation is for routine surveillance with frequent pelvic exams and visits with annual pap smear.  We will start with visits every 6 months x 5 years, at which time she can return to annual visits. We will share and alternate these visits with her primary gynecologist, Dr Nori Riis. Discussed signs and symptoms of recurrence including vaginal bleeding or discharge, leg pain or swelling and changes in bowel or bladder habits. She was given the opportunity to ask questions, which were answered to her satisfaction, and she is agreement with the above mentioned plan of care.  3)  Return to clinic in 6 months and to see Dr Nori Riis  HPI:  Karina Briggs is a 54 y.o. year old initially seen in consultation on 09/26/14 referred by Dr Nori Riis for grade 1 endometrial cancer.  She then underwent a robotic hysterectomy, BSO, and bilateral sentinel lymph node biopsy on 5/70/17 without complications.  Her postoperative course was uncomplicated.  Her final pathologic diagnosis is a Stage IA Grade 1 endometrioid endometrial cancer with no lymphovascular space invasion, 5/28 mm (25%) of myometrial invasion and negative lymph nodes.  She is seen today for a postoperative check and to discuss her pathology results and treatment plan.  Since discharge from the hospital, she is feeling well.  She has improving appetite, normal bowel and bladder function, and pain controlled with minimal PO medication. She has no other complaints today.  Past Medical History  Diagnosis Date  . Dizziness -  light-headed 02/2010    treated by ENT and neurology  . Cancer     endometrial  . Heart murmur     hx of with pregnancy  . Headache     occasionally  . Anemia     hx of with pregnancy   Past Surgical History  Procedure Laterality Date  . Dilation and curettage, diagnostic / therapeutic  1990  . Tonsillectomy    . Robotic assisted total hysterectomy with bilateral salpingo oopherectomy Bilateral 11/01/2014    Procedure: ROBOTIC ASSISTED TOTAL LAPARSCOPIC  HYSTERECTOMY WITH BILATERAL SALPINGO Clarita NODE MAPPING, LYMPHADENECTOMY;  Surgeon: [name], MD;  Location: [facility];  Service: Gynecology;  Laterality: Bilateral;   Family History  Problem Relation Age of Onset  . Hypertension Mother   . Arthritis Mother   . Alcohol abuse Neg Hx   . Cancer Neg Hx   . COPD Neg Hx   . Depression Neg Hx   . Diabetes Neg Hx   . Early death Neg Hx   . Heart disease Neg Hx   . Hyperlipidemia Neg Hx   . Stroke Neg Hx    Social History   Social History  . Marital Status: Married    Spouse Name: N/A  . Number of Children: N/A  . Years of Education: N/A   Occupational History  . Not on file.   Social History Main Topics  . Smoking status: Current Some Day Smoker -- 0.25 packs/day    Types: Cigarettes  . Smokeless tobacco: Never Used  . Alcohol Use: No  . Drug Use: No  . Sexual Activity: Not  Currently    Birth Control/ Protection: Post-menopausal   Other Topics Concern  . Not on file   Social History Narrative   Current Outpatient Prescriptions on File Prior to Visit  Medication Sig Dispense Refill  . estradiol (MINIVELLE) 0.05 MG/24HR patch Place 1 patch onto the skin 2 (two) times a week.    Marland Kitchen oxyCODONE-acetaminophen (PERCOCET/ROXICET) 5-325 MG per tablet Take 1-2 tablets by mouth every 4 (four) hours as needed for moderate pain or severe pain (moderate to severe pain). (Patient not taking: Reported on 11/28/2014) 30 tablet 0   No current facility-administered  medications on file prior to visit.   Allergies  Allergen Reactions  . Penicillins     Childhood reaction     Review of systems: Constitutional:  She has no weight gain or weight loss. She has no fever or chills. Eyes: No blurred vision Ears, Nose, Mouth, Throat: No dizziness, headaches or changes in hearing. No mouth sores. Cardiovascular: No chest pain, palpitations or edema. Respiratory:  No shortness of breath, wheezing or cough Gastrointestinal: She has normal bowel movements without diarrhea or constipation. She denies any nausea or vomiting. She denies blood in her stool or heart burn. Genitourinary:  She denies pelvic pain, pelvic pressure or changes in her urinary function. She has no hematuria, dysuria, or incontinence. She has no irregular vaginal bleeding or vaginal discharge Musculoskeletal: Denies muscle weakness or joint pains.  Skin:  She has no skin changes, rashes or itching Neurological:  Denies dizziness or headaches. No neuropathy, no numbness or tingling. Psychiatric:  She denies depression or anxiety. Hematologic/Lymphatic:   No easy bruising or bleeding   Physical Exam: Blood pressure 113/73, pulse 83, temperature 98.2 F (36.8 C), temperature source Oral, resp. rate 16, height 5\' 1"  (1.549 m), weight 146 lb (66.225 kg), SpO2 100 %. General: Well dressed, well nourished in no apparent distress.   HEENT:  Normocephalic and atraumatic, no lesions.  Extraocular muscles intact. Sclerae anicteric. Pupils equal, round, reactive. No mouth sores or ulcers. Thyroid is normal size, not nodular, midline. Skin:  No lesions or rashes. Breasts:  deferred Lungs:  deferred Cardiovascular: deferred Abdomen:  Soft, nontender, nondistended.  No palpable masses.  No hepatosplenomegaly.  No ascites. Normal bowel sounds.  No hernias.  Incisions are well healed Genitourinary: Normal EGBUS  Vaginal cuff intact.  No bleeding or discharge.  No cul de sac fullness. Extremities: No  cyanosis, clubbing or edema.  No calf tenderness or erythema. No palpable cords. Psychiatric: Mood and affect are appropriate. Neurological: Awake, alert and oriented x 3. Sensation is intact, no neuropathy.  Musculoskeletal: No pain, normal strength and range of motion.  Donaciano Eva, MD

## 2014-11-28 NOTE — Patient Instructions (Signed)
Plan to follow up in six months or sooner if needed.  Please call closer to the date to schedule.  Please call for any questions or concerns.

## 2015-08-28 DIAGNOSIS — Z01419 Encounter for gynecological examination (general) (routine) without abnormal findings: Secondary | ICD-10-CM | POA: Diagnosis not present

## 2015-08-28 DIAGNOSIS — Z6828 Body mass index (BMI) 28.0-28.9, adult: Secondary | ICD-10-CM | POA: Diagnosis not present

## 2015-08-28 DIAGNOSIS — Z1231 Encounter for screening mammogram for malignant neoplasm of breast: Secondary | ICD-10-CM | POA: Diagnosis not present

## 2015-08-28 DIAGNOSIS — Z8542 Personal history of malignant neoplasm of other parts of uterus: Secondary | ICD-10-CM | POA: Diagnosis not present

## 2015-09-20 DIAGNOSIS — Z130 Encounter for screening for diseases of the blood and blood-forming organs and certain disorders involving the immune mechanism: Secondary | ICD-10-CM | POA: Diagnosis not present

## 2015-09-20 DIAGNOSIS — R87628 Other abnormal cytological findings on specimens from vagina: Secondary | ICD-10-CM | POA: Diagnosis not present

## 2015-09-20 DIAGNOSIS — M8580 Other specified disorders of bone density and structure, unspecified site: Secondary | ICD-10-CM | POA: Diagnosis not present

## 2015-09-20 DIAGNOSIS — Z131 Encounter for screening for diabetes mellitus: Secondary | ICD-10-CM | POA: Diagnosis not present

## 2015-09-20 DIAGNOSIS — Z13220 Encounter for screening for lipoid disorders: Secondary | ICD-10-CM | POA: Diagnosis not present

## 2015-09-20 DIAGNOSIS — Z13820 Encounter for screening for osteoporosis: Secondary | ICD-10-CM | POA: Diagnosis not present

## 2015-09-20 DIAGNOSIS — Z8542 Personal history of malignant neoplasm of other parts of uterus: Secondary | ICD-10-CM | POA: Diagnosis not present

## 2015-09-20 DIAGNOSIS — Z13228 Encounter for screening for other metabolic disorders: Secondary | ICD-10-CM | POA: Diagnosis not present

## 2015-09-27 ENCOUNTER — Telehealth: Payer: Self-pay | Admitting: Gynecologic Oncology

## 2015-09-27 NOTE — Telephone Encounter (Signed)
Informed patient that Dr. Denman George had reviewed her recent pap smear results with no further recommendations and to plan to follow up as planned.  Patient stating she had experienced light vaginal spotting after intercourse.  Advised patient that Dr. Denman George would be made aware and we would reach out to her if any other recommendations are provided.  Verbalizing understanding.  No concerns voiced.

## 2015-09-29 ENCOUNTER — Telehealth: Payer: Self-pay

## 2015-09-29 NOTE — Telephone Encounter (Signed)
Orders received to contact the patient to schedule follow up for "vaginal" bleeding after intercourse . Patient contacted and updated with Dr [name] Rossi's recommendations for follow up, patient states she has an appointment with Dr Nori Riis on Monday June 26 , 2017 foe a colposcopy . Patient states she palns to follow through with appointment and follow the recommendations of Dr Nori Riis for follow up with Dr Denman George. Patient denies further questions at this time , will update Melissa Cross, APNP.

## 2015-10-02 DIAGNOSIS — C52 Malignant neoplasm of vagina: Secondary | ICD-10-CM | POA: Diagnosis not present

## 2015-10-02 DIAGNOSIS — N842 Polyp of vagina: Secondary | ICD-10-CM | POA: Diagnosis not present

## 2015-10-06 ENCOUNTER — Ambulatory Visit: Payer: BLUE CROSS/BLUE SHIELD | Attending: Gynecologic Oncology | Admitting: Gynecologic Oncology

## 2015-10-06 ENCOUNTER — Encounter: Payer: Self-pay | Admitting: Gynecologic Oncology

## 2015-10-06 VITALS — BP 115/66 | HR 83 | Temp 98.2°F | Resp 18 | Ht 61.0 in | Wt 154.3 lb

## 2015-10-06 DIAGNOSIS — R51 Headache: Secondary | ICD-10-CM | POA: Diagnosis not present

## 2015-10-06 DIAGNOSIS — R011 Cardiac murmur, unspecified: Secondary | ICD-10-CM | POA: Diagnosis not present

## 2015-10-06 DIAGNOSIS — Z880 Allergy status to penicillin: Secondary | ICD-10-CM | POA: Diagnosis not present

## 2015-10-06 DIAGNOSIS — C52 Malignant neoplasm of vagina: Secondary | ICD-10-CM | POA: Diagnosis not present

## 2015-10-06 DIAGNOSIS — C541 Malignant neoplasm of endometrium: Secondary | ICD-10-CM | POA: Insufficient documentation

## 2015-10-06 DIAGNOSIS — F17210 Nicotine dependence, cigarettes, uncomplicated: Secondary | ICD-10-CM | POA: Diagnosis not present

## 2015-10-06 DIAGNOSIS — C7982 Secondary malignant neoplasm of genital organs: Secondary | ICD-10-CM | POA: Insufficient documentation

## 2015-10-06 NOTE — Progress Notes (Signed)
POSTOPERATIVE FOLLOWUP  Assessment:    55 y.o. 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 No prior adjuvant therapy due to low risk features on initial diagnosis. Clinically localized recurrence.  Plan: 1) Pathology reports reviewed today 2) Treatment counseling - I discussed that we should first obtain imaging with PET/CT to evaluate for distant metastases. If the disease is localized to the vagina, I recommend chemoradiation (external beam and vaginal brachytherapy with weekly radiosensitizing cddp).   I recommend referral to radiation oncology, Dr Sondra Come, and to medical oncology.   I discussed that 70% of these cases are successfully salvaged with this regimen.  I will see her back after completion of therapy for evaluation of response.  HPI:  Karina Briggs is a 54 y.o. year old initially seen in consultation on 09/26/14 referred by Dr Nori Riis for grade 1 endometrial cancer.  She then underwent a robotic hysterectomy, BSO, and bilateral sentinel lymph node biopsy on [id number] without complications.  Her postoperative course was uncomplicated.  Her final pathologic diagnosis is a Stage IA Grade 1 endometrioid endometrial cancer with no lymphovascular space invasion, 5/28 mm (25%) of myometrial invasion and negative lymph nodes.  Interval Hx: She had a history of postcoital spotting since March, 2017. She saw Dr Nori Riis for routine surveillance exam in may, 2017 and a pap smear was taken which showed endometrial cells and a second showed endometrial cells. As response to this a colposcopy was performed and revealed a nodule at the right vaginal cuff which was biopsied on 10/02/15 which confirmed adenocarcinoma similar to her previous endometrial cancer.    Past Medical History  Diagnosis Date  . Dizziness - light-headed 02/2010    treated by ENT and neurology  . Cancer (HCC)     endometrial  . Heart murmur     hx of with  pregnancy  . Headache     occasionally  . Anemia     hx of with pregnancy   Past Surgical History  Procedure Laterality Date  . Dilation and curettage, diagnostic / therapeutic  1990  . Tonsillectomy    . Robotic assisted total hysterectomy with bilateral salpingo oopherectomy Bilateral 11/01/2014    Procedure: ROBOTIC ASSISTED TOTAL LAPARSCOPIC  HYSTERECTOMY WITH BILATERAL SALPINGO [place] NODE MAPPING, LYMPHADENECTOMY;  Surgeon: [name], MD;  Location: [facility];  Service: Gynecology;  Laterality: Bilateral;   Family History  Problem Relation Age of Onset  . Hypertension Mother   . Arthritis Mother   . Alcohol abuse Neg Hx   . Cancer Neg Hx   . COPD Neg Hx   . Depression Neg Hx   . Diabetes Neg Hx   . Early death Neg Hx   . Heart disease Neg Hx   . Hyperlipidemia Neg Hx   . Stroke Neg Hx    Social History   Social History  . Marital Status: Married    Spouse Name: N/A  . Number of Children: N/A  . Years of Education: N/A   Occupational History  . Not on file.   Social History Main Topics  . Smoking status: Current Some Day Smoker -- 0.25 packs/day    Types: Cigarettes  . Smokeless tobacco: Never Used  . Alcohol Use: No  . Drug Use: No  . Sexual Activity: Not Currently    Birth Control/ Protection: Post-menopausal   Other Topics Concern  . Not on file   Social History Narrative   No current  outpatient prescriptions on file prior to visit.   No current facility-administered medications on file prior to visit.   Allergies  Allergen Reactions  . Penicillins     Childhood reaction     Review of systems: Constitutional:  She has no weight gain or weight loss. She has no fever or chills. Eyes: No blurred vision Ears, Nose, Mouth, Throat: No dizziness, headaches or changes in hearing. No mouth sores. Cardiovascular: No chest pain, palpitations or edema. Respiratory:  No shortness of breath, wheezing or cough Gastrointestinal: She has normal  bowel movements without diarrhea or constipation. She denies any nausea or vomiting. She denies blood in her stool or heart burn. Genitourinary:  She denies pelvic pain, pelvic pressure or changes in her urinary function. She has no hematuria, dysuria, or incontinence. + vaginal spotting Musculoskeletal: Denies muscle weakness or joint pains.  Skin:  She has no skin changes, rashes or itching Neurological:  Denies dizziness or headaches. No neuropathy, no numbness or tingling. Psychiatric:  She denies depression or anxiety. Hematologic/Lymphatic:   No easy bruising or bleeding   Physical Exam: Blood pressure 115/66, pulse 83, temperature 98.2 F (36.8 C), temperature source Oral, resp. rate 18, height 5\' 1"  (1.549 m), weight 154 lb 4.8 oz (69.99 kg), SpO2 99 %. General: Well dressed, well nourished in no apparent distress.   HEENT:  Normocephalic and atraumatic, no lesions.  Extraocular muscles intact. Sclerae anicteric. Pupils equal, round, reactive. No mouth sores or ulcers. Thyroid is normal size, not nodular, midline. Skin:  No lesions or rashes. Breasts:  deferred Lungs:  deferred Cardiovascular: deferred Abdomen:  Soft, nontender, nondistended.  No palpable masses.  No hepatosplenomegaly.  No ascites. Normal bowel sounds.  No hernias.  Incisions are soft and healed. Genitourinary: Normal EGBUS  Vaginal cuff includes a 1.5cm puckered nodular lesion at the right vaginal fornix. It is palpably mobile and free from rectal wall but deeply infiltrates approximately 1cm deep to vaginal mucosa. Extremities: No cyanosis, clubbing or edema.  No calf tenderness or erythema. No palpable cords. Psychiatric: Mood and affect are appropriate. Neurological: Awake, alert and oriented x 3. Sensation is intact, no neuropathy.  Musculoskeletal: No pain, normal strength and range of motion.  Donaciano Eva, MD

## 2015-10-06 NOTE — Patient Instructions (Addendum)
Plan to meet with Dr. Gery Pray, Radiation Oncologist, and with Medical Oncologist, either Dr. Evlyn Clines or Dr. Carolyne Fiscal.  Plan to have your PET scan on July 12 at 12 noon with arrival at 11:30 am at [facility]  Nothing to eat or drink 6 hours before your PET appt.    Appointment with Dr. Sondra Come on Monday, July 10 at 1:30pm arrive at 1:15pm at the [facility].

## 2015-10-09 ENCOUNTER — Telehealth: Payer: Self-pay

## 2015-10-09 ENCOUNTER — Other Ambulatory Visit: Payer: Self-pay | Admitting: Gynecologic Oncology

## 2015-10-09 DIAGNOSIS — C541 Malignant neoplasm of endometrium: Secondary | ICD-10-CM

## 2015-10-09 NOTE — Progress Notes (Signed)
Peer to peer performed by Dr. Denman George for PET scan.  PET denied.  CT CAP authorized with [place] code [id number] good until for August 1.

## 2015-10-09 NOTE — Telephone Encounter (Signed)
Orders received from [place] to contact the pateitn to update with PET scan was denied by her insurance and a CT scan was ordered per Dr [name] . Attempted to reach the patient at home and on her cell phone , no answer with either numbers provided. Left a detailed message on both phones with call back fequested to ensure the patient received the message . Patient was also instructed that she can have nothing by mouth four hours prior to the CT scan, Melissa Cross, APNP updated.

## 2015-10-11 NOTE — Progress Notes (Signed)
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 Denman George with "a history of of postcoital spotting since March, 2017. She saw Dr Nori Riis for routine surveillance exam in may, 2017 and a pap smear was taken which showed endometrial cells and a second showed endometrial cells. As response to this a colposcopy was performed and revealed a nodule at the right vaginal cuff which was biopsied on 10/02/15 which confirmed adenocarcinoma similar to her previous endometrial cancer."  Biopsies revealed:   11/01/14 Diagnosis 1. Lymph node, biopsy, right proximal obturator - ONE OF ONE LYMPH NODES NEGATIVE FOR CARCINOMA (0/1). 2. Lymph node, sentinel, biopsy, right deep obturator - ONE OF ONE LYMPH NODES NEGATIVE FOR CARCINOMA (0/1). 3. Lymph node, sentinel, biopsy, left external iliac - ONE OF ONE LYMPH NODES NEGATIVE FOR CARCINOMA (0/1). 4. Lymph node, sentinel, biopsy, left obturator - ONE OF ONE LYMPH NODES NEGATIVE FOR CARCINOMA (0/1). 5. Uterus +/- tubes/ovaries, neoplastic - UTERUS: -ENDO/MYOMETRIUM: ENDOMETRIOID ADENOCARCINOMA, FIGO GRADE I. -TUMOR INVADES LESS THAN 1/2 MYOMETRIUM. -ADENOMYOSIS. -LEIOMYOMATA. -SEE ONCOLOGY TABLE. -SEROSA: UNREMARKABLE. - CERVIX: UNREMARKABLE. NO DYSPLASIA OR MALIGNANCY. - BILATERAL OVARIES: UNREMARKABLE. NO MALIGNANCY. - BILATERAL FALLOPIAN TUBES: UNREMARKABLE. NO MALIGNANCY. 6. Lymph node, sentinel, biopsy, right deep obturator - ONE OF ONE LYMPH NODES NEGATIVE FOR CARCINOMA (0/1). 7. Lymph node, sentinel, biopsy, left external iliac - ONE OF ONE LYMPH NODES NEGATIVE FOR CARCINOMA (0/1). 8. Lymph node, sentinel, biopsy, left external iliac - ONE OF ONE LYMPH NODES NEGATIVE FOR CARCINOMA (0/1). 9. Lymph node, sentinel, biopsy, left obturator - ONE OF ONE LYMPH NODES NEGATIVE FOR CARCINOMA (0/1).  Past/Anticipated interventions by Gyn/Onc surgery, if any: 11/01/14 - Procedure: ROBOTIC  ASSISTED TOTAL LAPARSCOPIC  HYSTERECTOMY WITH BILATERAL SALPINGO [place] NODE MAPPING, LYMPHADENECTOMY;  Surgeon: [name], MD  Past/Anticipated interventions by medical oncology, if any: appointment with Dr. Marko Plume on 10/19/15  Weight changes, if any: no  Bowel/Bladder complaints, if any: No.  Nausea/Vomiting, if any: no  Pain issues, if any:  no  SAFETY ISSUES:  Prior radiation? no  Pacemaker/ICD? no  Possible current pregnancy? no  Is the patient on methotrexate? no  Current Complaints / other details:  CT scheduled for 10/18/15. Dr. Denman George is recommending that "If the disease is localized to the vagina, I recommend chemoradiation (external beam and vaginal brachytherapy with weekly radiosensitizing cddp)."  Patient is here with her husband.  She last had vaginal bleeding last week.  She reports it is not very much when it occurs.  BP 121/69 mmHg  Pulse 80  Temp(Src) 98 F (36.7 C) (Oral)  Ht 5\' 1"  (1.549 m)  Wt 155 lb 8 oz (70.534 kg)  BMI 29.40 kg/m2  SpO2 100%   Wt Readings from Last 3 Encounters:  10/16/15 155 lb 8 oz (70.534 kg)  10/06/15 154 lb 4.8 oz (69.99 kg)  11/28/14 146 lb (66.225 kg)

## 2015-10-12 ENCOUNTER — Other Ambulatory Visit: Payer: BLUE CROSS/BLUE SHIELD

## 2015-10-13 ENCOUNTER — Ambulatory Visit: Payer: BLUE CROSS/BLUE SHIELD | Admitting: Oncology

## 2015-10-13 ENCOUNTER — Other Ambulatory Visit: Payer: BLUE CROSS/BLUE SHIELD

## 2015-10-16 ENCOUNTER — Ambulatory Visit
Admission: RE | Admit: 2015-10-16 | Discharge: 2015-10-16 | Disposition: A | Discharge status: Home / Self Care | Payer: BLUE CROSS/BLUE SHIELD | Source: Ambulatory Visit | Attending: Radiation Oncology | Admitting: Radiation Oncology

## 2015-10-16 ENCOUNTER — Telehealth: Payer: Self-pay | Admitting: Gynecologic Oncology

## 2015-10-16 ENCOUNTER — Encounter: Payer: Self-pay | Admitting: Radiation Oncology

## 2015-10-16 VITALS — BP 121/69 | HR 80 | Temp 98.0°F | Ht 61.0 in | Wt 155.5 lb

## 2015-10-16 DIAGNOSIS — Z880 Allergy status to penicillin: Secondary | ICD-10-CM | POA: Diagnosis not present

## 2015-10-16 DIAGNOSIS — R011 Cardiac murmur, unspecified: Secondary | ICD-10-CM | POA: Insufficient documentation

## 2015-10-16 DIAGNOSIS — R51 Headache: Secondary | ICD-10-CM | POA: Insufficient documentation

## 2015-10-16 DIAGNOSIS — D649 Anemia, unspecified: Secondary | ICD-10-CM | POA: Insufficient documentation

## 2015-10-16 DIAGNOSIS — Z510 Encounter for antineoplastic radiation therapy: Secondary | ICD-10-CM | POA: Diagnosis not present

## 2015-10-16 DIAGNOSIS — F17210 Nicotine dependence, cigarettes, uncomplicated: Secondary | ICD-10-CM | POA: Insufficient documentation

## 2015-10-16 DIAGNOSIS — C7982 Secondary malignant neoplasm of genital organs: Secondary | ICD-10-CM | POA: Diagnosis not present

## 2015-10-16 DIAGNOSIS — C541 Malignant neoplasm of endometrium: Secondary | ICD-10-CM | POA: Diagnosis not present

## 2015-10-16 NOTE — Telephone Encounter (Signed)
Returned call to patient.  All questions answered about her recurrence and treatment plan.  Advised to call for any needs or concerns.

## 2015-10-16 NOTE — Progress Notes (Signed)
Radiation Oncology         [phone] ________________________________  Initial Outpatient Consultation  Name: Karina Briggs MRN: [id number]  Date: 10/16/2015  DOB: [birth year]  CC:[name], MD  [name], MD   REFERRING PHYSICIAN: [name], MD  DIAGNOSIS: Stage IA Grade 1 endometrioid endometrial cancer with no lymphovascular space invasion, 5/28 mm (25%) of myometrial invasion and negative lymph nodes, now with vaginal cuff recurrence approximately 1 year out from surgery  [name] is a 55 y.o. female who is seen out of the courtesy of Dr. Denman George for an opinion concerning radiation therapy as part of management of the patient's recently diagnosed recurrent endometrial cancer.  Approximately a year ago the patient underwent a robotic hysterectomy, BSO and bilateral sentinel lymph node biopsy. The patient was found to have stage IA grade 1 endometrioid endometrial cancer with no evidence of lymphovascular space invasion, 25% myometrial invasion and negative nodes. No adjuvant treatment was recommended given the early stage. Patient did well until this spring when she developed postcoital spotting. The patient was seen by Dr. Nori Riis in May and a Pap smear revealed endometrial cells. A second Pap smear also showed endometrial cells. The patient was scheduled for colposcopy and at the time of colposcopy and nodule is noted along the right vaginal cuff. This was biopsied and returned adenocarcinoma similar to her previous endometrial cancer. Patient was seen by Dr. Denman George and on examination was noted to have a 1.5 cm nodular lesion along the right vaginal fornix. It was felt to be mobile and free of the rectal wall but deeply infiltrated to approximately 1 cm deep to the vaginal mucosa. A PET scan was recommended for staging purposes however this was not approved by the patient's insurance company. The patient is scheduled for a CT scan on July 12. The patient is  now seen in radiation oncology for consideration for salvage treatment.  PREVIOUS RADIATION THERAPY: No  PAST MEDICAL HISTORY:  has a past medical history of Dizziness - light-headed (02/2010); Cancer ([place]); Heart murmur; Headache; and Anemia.    PAST SURGICAL HISTORY: Past Surgical History  Procedure Laterality Date  . Dilation and curettage, diagnostic / therapeutic  1990  . Tonsillectomy    . Robotic assisted total hysterectomy with bilateral salpingo oopherectomy Bilateral 11/01/2014    Procedure: ROBOTIC ASSISTED TOTAL LAPARSCOPIC  HYSTERECTOMY WITH BILATERAL SALPINGO [place] NODE MAPPING, LYMPHADENECTOMY;  Surgeon: [name], MD;  Location: [facility];  Service: Gynecology;  Laterality: Bilateral;    FAMILY HISTORY: family history includes Arthritis in her mother; Hypertension in her mother. There is no history of Alcohol abuse, Cancer, COPD, Depression, Diabetes, Early death, Heart disease, Hyperlipidemia, or Stroke.  SOCIAL HISTORY:  reports that she has been smoking Cigarettes.  She has a 15 pack-year smoking history. She has never used smokeless tobacco. She reports that she does not drink alcohol or use illicit drugs.  ALLERGIES: Penicillins  MEDICATIONS:  Current Outpatient Prescriptions  Medication Sig Dispense Refill  . cholecalciferol (VITAMIN D) 1000 units tablet Take 1,000 Units by mouth daily.     No current facility-administered medications for this encounter.    REVIEW OF SYSTEMS:  A 15 point review of systems is documented in the electronic medical record. This was obtained by the nursing staff. However, I reviewed this with the patient to discuss relevant findings and make appropriate changes. No consistent vaginal bleeding. She reports some bleeding after her recent procedures. She denies any pain within the  pelvic area urinary symptoms or bowel complaints   PHYSICAL EXAM:  height is 5\' 1"  (1.549 m) and weight is 155 lb 8 oz (70.534 kg). Her oral  temperature is 98 F (36.7 C). Her blood pressure is 121/69 and her pulse is 80. Her oxygen saturation is 100%.   General: Alert and oriented, in no acute distress HEENT: Head is normocephalic. Extraocular movements are intact. Oropharynx is clear. Neck: Neck is supple, no palpable cervical or supraclavicular lymphadenopathy. Heart: Regular in rate and rhythm with no murmurs, rubs, or gallops. Chest: Clear to auscultation bilaterally, with no rhonchi, wheezes, or rales. Abdomen: Soft, nontender, nondistended, with no rigidity or guarding. Laparotomy scars well-healed Extremities: No cyanosis or edema. Lymphatics: No inguinal adenopathy Skin: No concerning lesions. Musculoskeletal: symmetric strength and muscle tone throughout. Neurologic: Cranial nerves II through XII are grossly intact. No obvious focalities. Speech is fluent. Coordination is intact. Psychiatric: Judgment and insight are intact. Affect is appropriate. On pelvic examination the external genitalia are unremarkable. A speculum exam is performed. An erythematous nodule is noted in the right vaginal fornix. On bimanual examination this lesion measures approximately 1 cm in size.     ECOG = 1  LABORATORY DATA:  Lab Results  Component Value Date   WBC 13.1* 11/02/2014   HGB 11.5* 11/02/2014   HCT 35.2* 11/02/2014   MCV 94.6 11/02/2014   PLT 186 11/02/2014   NEUTROABS 4.9 10/28/2014   Lab Results  Component Value Date   NA 138 11/02/2014   K 5.0 11/02/2014   CL 105 11/02/2014   CO2 28 11/02/2014   GLUCOSE 137* 11/02/2014   CREATININE 0.94 11/02/2014   CALCIUM 8.9 11/02/2014      RADIOGRAPHY: No results found.    IMPRESSION: Recurrent endometrial cancer. The patient appears to have a isolated recurrence within the proximal vagina. She would be a good candidate for salvage therapy with external beam radiation therapy and vaginal brachytherapy treatments along with radiosensitizing cisplatin-based chemotherapy.  Above the patient will proceed with staging on July 12. Assuming no evidence of distant metastasis the patient will proceed with simulation on July 18 with treatments to begin approximately 1 week later along with radiosensitizing chemotherapy. I discussed the course of treatment side effects and potential long-term toxicities of radiation therapy including external beam and vaginal brachytherapy with the patient and her husband. She appears to understand and wishes to proceed with planned course of treatment.  PLAN: Simulation and planning on July 18.  Patient will be treated with intensity modulated radiation therapy to treat the area of recurrence and associated pelvic nodal areas while at the same time limiting dose to the small bowel and bone marrow. After completion of external beam the patient will likely proceed with standard vaginal brachytherapy treatments, unless she continues to have a significant nodule warranting the Capri applicator or possibly an interstitial implant.     ------------------------------------------------  Blair Promise, PhD, MD

## 2015-10-17 ENCOUNTER — Other Ambulatory Visit: Payer: BLUE CROSS/BLUE SHIELD

## 2015-10-18 ENCOUNTER — Other Ambulatory Visit: Payer: BLUE CROSS/BLUE SHIELD

## 2015-10-18 ENCOUNTER — Other Ambulatory Visit: Payer: Self-pay | Admitting: Oncology

## 2015-10-18 ENCOUNTER — Ambulatory Visit (HOSPITAL_COMMUNITY)
Admission: RE | Admit: 2015-10-18 | Discharge: 2015-10-18 | Disposition: A | Discharge status: Home / Self Care | Payer: BLUE CROSS/BLUE SHIELD | Source: Ambulatory Visit | Attending: Gynecologic Oncology | Admitting: Gynecologic Oncology

## 2015-10-18 ENCOUNTER — Encounter: Payer: Self-pay | Admitting: *Deleted

## 2015-10-18 ENCOUNTER — Ambulatory Visit (HOSPITAL_COMMUNITY): Payer: BLUE CROSS/BLUE SHIELD

## 2015-10-18 DIAGNOSIS — R938 Abnormal findings on diagnostic imaging of other specified body structures: Secondary | ICD-10-CM | POA: Insufficient documentation

## 2015-10-18 DIAGNOSIS — C541 Malignant neoplasm of endometrium: Secondary | ICD-10-CM

## 2015-10-18 MED ORDER — IOPAMIDOL (ISOVUE-300) INJECTION 61%
100.0000 mL | Freq: Once | INTRAVENOUS | Status: AC | PRN
  Administered 2015-10-18: 100 mL via INTRAVENOUS

## 2015-10-19 ENCOUNTER — Telehealth: Payer: Self-pay

## 2015-10-19 ENCOUNTER — Ambulatory Visit (HOSPITAL_BASED_OUTPATIENT_CLINIC_OR_DEPARTMENT_OTHER): Payer: BLUE CROSS/BLUE SHIELD | Admitting: Oncology

## 2015-10-19 ENCOUNTER — Ambulatory Visit (HOSPITAL_BASED_OUTPATIENT_CLINIC_OR_DEPARTMENT_OTHER): Payer: BLUE CROSS/BLUE SHIELD

## 2015-10-19 VITALS — BP 93/69 | HR 86 | Temp 98.1°F | Resp 18 | Ht 61.0 in | Wt 154.2 lb

## 2015-10-19 DIAGNOSIS — Z720 Tobacco use: Secondary | ICD-10-CM | POA: Diagnosis not present

## 2015-10-19 DIAGNOSIS — C7982 Secondary malignant neoplasm of genital organs: Secondary | ICD-10-CM

## 2015-10-19 DIAGNOSIS — C541 Malignant neoplasm of endometrium: Secondary | ICD-10-CM

## 2015-10-19 LAB — COMPREHENSIVE METABOLIC PANEL
ALBUMIN: 3.9 g/dL (ref 3.5–5.0)
ALK PHOS: 73 U/L (ref 40–150)
ALT: 13 U/L (ref 0–55)
ANION GAP: 10 meq/L (ref 3–11)
AST: 14 U/L (ref 5–34)
BUN: 15.2 mg/dL (ref 7.0–26.0)
CO2: 29 mEq/L (ref 22–29)
Calcium: 9.7 mg/dL (ref 8.4–10.4)
Chloride: 106 mEq/L (ref 98–109)
Creatinine: 1 mg/dL (ref 0.6–1.1)
EGFR: 62 mL/min/{1.73_m2} — AB (ref >90)
GLUCOSE: 121 mg/dL (ref 70–140)
POTASSIUM: 4.5 meq/L (ref 3.5–5.1)
Sodium: 145 mEq/L (ref 136–145)
Total Bilirubin: 0.39 mg/dL (ref 0.20–1.20)
Total Protein: 7 g/dL (ref 6.4–8.3)

## 2015-10-19 LAB — CBC WITH DIFFERENTIAL/PLATELET
BASO%: 0.8 % (ref 0.0–2.0)
BASOS ABS: 0.1 10*3/uL (ref 0.0–0.1)
EOS%: 4.8 % (ref 0.0–7.0)
Eosinophils Absolute: 0.4 10*3/uL (ref 0.0–0.5)
HCT: 41.4 % (ref 34.8–46.6)
HEMOGLOBIN: 14.1 g/dL (ref 11.6–15.9)
LYMPH%: 30.1 % (ref 14.0–49.7)
MCH: 31.7 pg (ref 25.1–34.0)
MCHC: 34.1 g/dL (ref 31.5–36.0)
MCV: 93 fL (ref 79.5–101.0)
MONO#: 0.6 10*3/uL (ref 0.1–0.9)
MONO%: 7.4 % (ref 0.0–14.0)
NEUT#: 4.3 10*3/uL (ref 1.5–6.5)
NEUT%: 56.9 % (ref 38.4–76.8)
Platelets: 252 10*3/uL (ref 145–400)
RBC: 4.45 10*6/uL (ref 3.70–5.45)
RDW: 13.2 % (ref 11.2–14.5)
WBC: 7.5 10*3/uL (ref 3.9–10.3)
lymph#: 2.3 10*3/uL (ref 0.9–3.3)

## 2015-10-19 LAB — MAGNESIUM: MAGNESIUM: 2.1 mg/dL (ref 1.5–2.5)

## 2015-10-19 NOTE — Progress Notes (Signed)
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 Path findings additionally were of 25% myometrial invasion (5/28 mm), negative sentinel nodes and no LVSI. She did well until postcoital spotting March 2017. She had follow up with Dr Nori Riis in 08-2015, with PAPs x 2 with endometrial cells and nodule found at right vaginal cuff. She saw Dr Denman George 10-06-15, with 1.5 cm nodular lesion at right vaginal fornix, mobile and free from rectal wall but infiltrating ~ 1 cm deep to vaginal mucosa. PET was not approved by insurance; CT CAP 10-18-15 had rounded soft tissue at right vaginal cuff  2.3 x 2.0 cm, with no other evidence of malignancy. Recommendation is for external beam radiation with sensitizing CDDP, + vaginal brachytherapy. Patient had consultation with Dr Sondra Come on 10-16-15, simulation planned 10-24-15 and radiation to begin ~ a week later (those dates not in EMR yet). She and husband attended chemotherapy teaching class prior to this visit.  Other than post coital spotting she has had no discomfort from the recurrent disease.   REVIEW OF SYSTEMS: Weight a little above her ideal 130. No other bleeding. Good energy, good appetite. No HA.  Wears glasses occasionally. No difficulty hearing. No environmental allergies. No dental problems, up to date on dental exams. No history of thyroid disease. No respiratory symptoms. No changes in breasts. Hot flashes intermittently since surgery 2016. Occasional GERD with coffee or cigarettes. No changes in bowels. Slight arthritis thumb. No bladder symptoms. No peripheral neuropathy. No swelling LE.   Remainder of full 10 point review of systems negative.   ALLERGIES: Penicillins  PAST MEDICAL/ SURGICAL HISTORY:    D&C G2 Tonsils in childhood Mammograms 09-2015 at Dr Verlon Au office, reportedly not remarkable Bone density by Dr Nori Riis "just a little low" Colonoscopy by Dr Earlean Shawl 2014  CURRENT MEDICATIONS: reviewed as listed now in EMR, only Vit D currently. Prescriptions for zofran and compazine.  [place]:  Married, lives with husband in [place]. 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  2 children ages 57 and 95 in [place], 2 step children and 3 step grands also in [place].  No ETOH. No transfusions.  1/4 - 1/2 ppd smoking ongoing. Has smoked intermittently since 20s, tho has stopped mutliple times and is interested in DC entirely now. Husband also smokes but wants to stop.  FAMILY HISTORY:  No cancer Mother living, HTN and arthritis Father died of pancreatitis Paternal aunts x2 with breast cancer probably in their 51s  Siblings healthy Children healthy       PHYSICAL EXAM:   Weight 154 lbs, height 5'1", BMI 29.2. BP 93/69, HR 86 regular, resp 18 not labored, 98.1, 99% sat Alert, pleasant, cooperative lady,  well nourished, appears comfortable, excellent historian. Husband very supportive  HEENT: normal hair pattern. PERRL, not icteric. Oral mucosa moist and clear, post pharynx also. Neck supple without JVD or thyroid mass. No cervical, supraclavicular, axillary or inguinal adenopathy.  Respirations not labored, Lungs clear to A and P. Spine not tender. Heart RRR no gallop. Peripheral pulses intact and symmetrical. Peripheral veins UE appear adequate for planned chemotherapy.  Back not tender. LE no edema, cords, tenderness. Breasts bilaterally without dominant mass, skin or nipple findings of concern, axillae benign. Abdomen well healed incisions from robotic gyn surgery, soft, not tender, normally active BS, no HSM or mass. Neuro CN, motor, sensory, cerebellar nonfocal. PSYCH appropriate mood and affect, does not appear anxious. Skin without rash, ecchymosis, petechiae.       LABORATORY DATA:  Results for orders placed or performed in visit on 10/19/15 (from the past 48 hour(s))  CBC with Differential     Status: None   Collection Time: 10/19/15  2:26 PM  Result Value Ref Range   WBC 7.5 3.9 - 10.3 10e3/uL   NEUT# 4.3 1.5 - 6.5 10e3/uL   HGB 14.1 11.6 - 15.9 g/dL   HCT 41.4 34.8 - 46.6 %   Platelets 252 145 - 400 10e3/uL   MCV 93.0 79.5 - 101.0 fL   MCH 31.7 25.1 - 34.0 pg   MCHC 34.1 31.5 - 36.0 g/dL   RBC 4.45 3.70 - 5.45 10e6/uL   RDW 13.2 11.2 - 14.5 %   lymph# 2.3 0.9 - 3.3 10e3/uL   MONO# 0.6 0.1 - 0.9 10e3/uL   Eosinophils Absolute 0.4 0.0 - 0.5 10e3/uL   Basophils Absolute 0.1 0.0 - 0.1 10e3/uL   NEUT% 56.9 38.4 - 76.8 %   LYMPH% 30.1 14.0 - 49.7 %   MONO% 7.4 0.0 - 14.0 %   EOS% 4.8 0.0 - 7.0 %   BASO% 0.8 0.0 - 2.0 %  Comprehensive metabolic panel     Status: Abnormal   Collection Time: 10/19/15  2:26 PM  Result Value Ref Range   Sodium 145 136 - 145 mEq/L   Potassium 4.5 3.5 - 5.1 mEq/L   Chloride 106 98 - 109 mEq/L   CO2 29 22 - 29 mEq/L   Glucose 121 70 - 140 mg/dl    Comment: Glucose reference range is for nonfasting patients. Fasting glucose reference range is 70- 100.   BUN 15.2 7.0 - 26.0 mg/dL   Creatinine 1.0 0.6 - 1.1 mg/dL   Total Bilirubin 0.39 0.20 - 1.20 mg/dL   Alkaline Phosphatase 73 40 - 150 U/L   AST 14 5 - 34 U/L   ALT 13 0  - 55 U/L   Total Protein 7.0 6.4 - 8.3 g/dL   Albumin 3.9 3.5 - 5.0 g/dL   Calcium 9.7 8.4 - 10.4 mg/dL   Anion Gap 10 3 - 11 mEq/L   EGFR 62 (L) >90 ml/min/1.73 m2    Comment: eGFR is calculated using the CKD-EPI Creatinine Equation (2009)  Magnesium     Status: None   Collection Time: 10/19/15  2:26 PM  Result Value Ref Range   Magnesium 2.1 1.5 - 2.5 mg/dl   above labs reviewed with patient at time of visit, copy given.    SURGICAL PATHOLOGY 11-01-2014 FINAL for MYKAILA, BLUNCK ([id number]) Patient: TIONA, RUANE Collected: 11/01/2014 Client: [facility] Accession: [id number] Received: 11/01/2014 [name], MD REPORT OF SURGICAL PATHOLOGY FINAL DIAGNOSIS Diagnosis 1. Lymph node, biopsy, right  proximal obturator - ONE OF ONE LYMPH NODES NEGATIVE FOR CARCINOMA (0/1). 2. Lymph node, sentinel, biopsy, right deep obturator - ONE OF ONE LYMPH NODES NEGATIVE FOR CARCINOMA (0/1). 3. Lymph node, sentinel, biopsy, left external iliac - ONE OF ONE LYMPH NODES NEGATIVE FOR CARCINOMA (0/1). 4. Lymph node, sentinel, biopsy, left obturator - ONE OF ONE LYMPH NODES NEGATIVE FOR CARCINOMA (0/1). 5. Uterus +/- tubes/ovaries, neoplastic - UTERUS: -ENDO/MYOMETRIUM: ENDOMETRIOID ADENOCARCINOMA, FIGO GRADE I. -TUMOR INVADES LESS THAN 1/2 MYOMETRIUM. -ADENOMYOSIS. -LEIOMYOMATA. -SEE ONCOLOGY TABLE. -SEROSA: UNREMARKABLE. - CERVIX: UNREMARKABLE. NO DYSPLASIA OR MALIGNANCY. - BILATERAL OVARIES: UNREMARKABLE. NO MALIGNANCY. - BILATERAL FALLOPIAN TUBES: UNREMARKABLE. NO MALIGNANCY. 6. Lymph node, sentinel, biopsy, right deep obturator - ONE OF ONE LYMPH NODES NEGATIVE FOR CARCINOMA (0/1). 7. Lymph node, sentinel, biopsy, left external iliac - ONE OF ONE LYMPH NODES NEGATIVE FOR CARCINOMA (0/1). 8. Lymph node, sentinel, biopsy, left external iliac - ONE OF ONE LYMPH NODES NEGATIVE FOR CARCINOMA (0/1). 9. Lymph node, sentinel, biopsy, left obturator - ONE OF ONE LYMPH NODES NEGATIVE  FOR CARCINOMA (0/1).  Microscopic Comment 5. ONCOLOGY TABLE-UTERUS, CARCINOMA OR CARCINOSARCOMA Specimen: Uterus, cervix, bilateral ovaries and fallopian tubes. Procedure: Total hysterectomy with salpingo-oophorectomy. Lymph node sampling performed: Left external iliac and obturator, Right obturator Specimen integrity: Intact. Maximum tumor size: Largest continuous focus 1.5 cm, see comment. Histologic type: Endometrioid adenocarcinoma. Grade: FIGO grade 1. Myometrial invasion: 0.5 cm where myometrium is 2.8 cm in thickness Cervical stromal involvement: Not identified. Extent of involvement of other organs: N/A. Lymph - vascular invasion: Not identified. Peritoneal washings: N/A. Lymph nodes: # examined 8 ; # positive 0 Pelvic lymph nodes: 0 involved of 8 lymph nodes. Para-aortic lymph nodes: 0 involved of 0 lymph nodes. Other (specify involvement and site): N/A. (nondiagnostic) TNM code: pT1a, pN0 FIGO Stage (based on pathologic findings, needs clinical correlation): IA Comment: The entire endometrium was submitted. The carcinoma is multifocal arising in a background of complex and simple hyperplasia. The largest continuous focus measures 1.5 cm.  RADIOGRAPHY: Ct Chest W Contrast  10/18/2015  CLINICAL DATA:  Recurrent endometrial carcinoma. Staging. Previous hysterectomy in 2016. EXAM: CT CHEST, ABDOMEN, AND PELVIS WITH CONTRAST TECHNIQUE: Multidetector CT imaging of the chest, abdomen and pelvis was performed following the standard protocol during bolus administration of intravenous contrast. CONTRAST:  157m ISOVUE-300 IOPAMIDOL (ISOVUE-300) INJECTION 61% COMPARISON:  None. FINDINGS: CT CHEST FINDINGS Cardiovascular: No significant abnormality. Mediastinum/Lymph Nodes: No masses, pathologically enlarged lymph nodes, or other significant abnormality. Lungs/Pleura: No pulmonary mass, infiltrate, or effusion. Musculoskeletal: No chest wall mass or suspicious bone lesions identified. CT  ABDOMEN PELVIS FINDINGS Hepatobiliary: No masses or other significant abnormality. Gallbladder is unremarkable. Pancreas: No mass, inflammatory changes, or other significant abnormality. Spleen: Within normal limits in size and appearance. Adrenals/Urinary Tract: No masses identified. No evidence of hydronephrosis. Stomach/Bowel: No evidence of obstruction, inflammatory process, or abnormal fluid collections. Vascular/Lymphatic: No pathologically enlarged lymph nodes. No evidence of abdominal aortic aneurysm. Reproductive: Previous hysterectomy noted. Asymmetric enhancing rounded soft tissue density is seen involving the right vaginal cuff which measures 2.3 x 2.0 cm on image 104/series 2, is suspicious for locally recurrent endometrial carcinoma. No other pelvic or adnexal mass identified. Other: None. Musculoskeletal:  No suspicious bone lesions identified. IMPRESSION: Asymmetric 2.3 cm enhancing soft tissue density involving the right vaginal cuff, highly suspicious for locally recurrent carcinoma. No pathologically enlarged lymph nodes or other metastatic disease identified. Electronically Signed   By: JEarle GellM.D.   On: 10/18/2015 16:24  Ct Abdomen Pelvis W Contrast  10/18/2015  CLINICAL DATA:  Recurrent endometrial carcinoma. Staging. Previous hysterectomy in 2016. EXAM: CT CHEST, ABDOMEN, AND PELVIS WITH CONTRAST TECHNIQUE: Multidetector CT imaging of the chest, abdomen and pelvis was performed following the standard protocol during bolus administration of intravenous contrast. CONTRAST:  132m ISOVUE-300 IOPAMIDOL (ISOVUE-300) INJECTION 61% COMPARISON:  None. FINDINGS: CT CHEST FINDINGS Cardiovascular: No significant abnormality. Mediastinum/Lymph Nodes: No masses, pathologically enlarged lymph nodes, or other significant abnormality. Lungs/Pleura: No pulmonary mass, infiltrate, or effusion. Musculoskeletal: No chest wall mass or suspicious bone lesions identified. CT ABDOMEN PELVIS FINDINGS  Hepatobiliary: No masses or other significant abnormality. Gallbladder is unremarkable. Pancreas: No mass, inflammatory changes, or other significant abnormality. Spleen: Within normal limits in size and appearance. Adrenals/Urinary Tract: No masses identified. No evidence of hydronephrosis. Stomach/Bowel: No evidence of obstruction, inflammatory process, or abnormal fluid collections. Vascular/Lymphatic: No pathologically enlarged lymph nodes. No evidence of abdominal aortic aneurysm. Reproductive: Previous hysterectomy noted. Asymmetric enhancing rounded soft tissue density is seen involving the right vaginal cuff which measures 2.3 x 2.0 cm on image 104/series 2, is suspicious for locally recurrent endometrial carcinoma. No other pelvic or adnexal mass identified. Other: None. Musculoskeletal:  No suspicious bone lesions identified. IMPRESSION: Asymmetric 2.3 cm enhancing soft tissue density involving the right vaginal cuff, highly suspicious for locally recurrent carcinoma. No pathologically enlarged lymph nodes or other metastatic disease identified. Electronically Signed   By: JEarle GellM.D.   On: 10/18/2015 16:24   PACs images reviewed by MD. Findings of CTs reviewed with patient and husband at time of visit.     DISCUSSION: All of history above reviewed with patient and husband, including circumstances surrounding original diagnosis, interventions then, finding of recurrent disease at vaginal cuff and recommendations for treatment. We have discussed rationale for sensitizing chemotherapy, outpatient administration of CDDP including aggressive oral and IV hydration, antiemetics, follow up of counts and chemistries. We plan to begin around time of radiation start, those dates still pending. Message sent to radiation oncology and to collaborative med onc RNs to coordinate. I have tentatively requested first CDDP on 7-31, given simulation on 7-18 / infusion scheduling / EPIC upgrade starting 7-24.   Patient and husband understand that they can contact this office at any time if questions or concerns. Verbal consent given for treatment.   IMPRESSION / PLAN:  1.vaginal cuff recurrence of endometrial carcinoma: original diagnosis 1A grade 1 endometrioid endometrial carcinoma, treated with robotic assisted hysterectomy BSO bilateral sentinel nodes 11-01-14, Vaginal cuff recurrence identified on follow up exam 08-2015, without other disease by CT 10-18-15. Plan IMRT to area of recurrence and associated pelvic nodal areas with sensitizing CDDP, and likely vaginal brachytherapy.  2.long and ongoing tobacco: patient wants to stop, actively trying to DC. Tobacco cessation counseling done. 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 Up to date colonoscopy 2014. 6.per patient, slightly low bone density: bone density scan by Dr NNori Riis not in this EMR. Note is on VIt D    Patient and husband have had questions answered to their satisfaction and are in agreement with plan above. They can contact this office for questions or concerns at any time prior to next scheduled visit. Chemo orders entered, anticipate start 11-06-15 but will confirm with RT dates when available. Message to managed care, message to radiation oncology, message to collaborative med onc RNs. Cc Drs RElie Goody Kinard Time spent 60 min, including >50% discussion and coordination of care.  Evlyn Clines, MD 10/19/2015 3:12 PM

## 2015-10-19 NOTE — Telephone Encounter (Signed)
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 Attempted to contact the patient ,no answer, left a detailed message with call back number provided if additional questions  Ar[place]. However the patient was seen by Dr Marko Plume today and the results were discussed at that time .

## 2015-10-20 ENCOUNTER — Telehealth: Payer: Self-pay | Admitting: Oncology

## 2015-10-20 ENCOUNTER — Encounter: Payer: Self-pay | Admitting: Oncology

## 2015-10-20 DIAGNOSIS — C541 Malignant neoplasm of endometrium: Secondary | ICD-10-CM | POA: Insufficient documentation

## 2015-10-20 DIAGNOSIS — Z720 Tobacco use: Secondary | ICD-10-CM | POA: Insufficient documentation

## 2015-10-20 MED ORDER — PROCHLORPERAZINE MALEATE 10 MG PO TABS: 10.0000 mg | ORAL_TABLET | Freq: Four times a day (QID) | ORAL | Status: DC | PRN

## 2015-10-20 MED ORDER — ONDANSETRON HCL 8 MG PO TABS: 8.0000 mg | ORAL_TABLET | Freq: Three times a day (TID) | ORAL | Status: DC | PRN

## 2015-10-20 NOTE — Telephone Encounter (Signed)
lvm to inform pt of 7/31 appt date/times per LL 7/13 pof

## 2015-10-23 NOTE — Progress Notes (Signed)
Does patient have an allergy to IV contrast dye?: No.   Has patient ever received premedication for IV contrast dye?: No.   Does patient take metformin?: No.  If patient does take metformin when was the last dose: n/a  Date of lab work: October 19, 2015 BUN: 15.2  CR: 1.0   IV site: left forearm #22

## 2015-10-24 ENCOUNTER — Ambulatory Visit
Admission: RE | Admit: 2015-10-24 | Discharge: 2015-10-24 | Disposition: A | Discharge status: Home / Self Care | Payer: BLUE CROSS/BLUE SHIELD | Source: Ambulatory Visit | Attending: Radiation Oncology | Admitting: Radiation Oncology

## 2015-10-24 ENCOUNTER — Encounter: Payer: Self-pay | Admitting: Radiation Oncology

## 2015-10-24 VITALS — BP 116/55 | HR 78 | Temp 98.3°F | Resp 16 | Ht 61.0 in | Wt 155.1 lb

## 2015-10-24 DIAGNOSIS — C541 Malignant neoplasm of endometrium: Secondary | ICD-10-CM | POA: Diagnosis not present

## 2015-10-24 DIAGNOSIS — C7982 Secondary malignant neoplasm of genital organs: Secondary | ICD-10-CM

## 2015-10-24 DIAGNOSIS — Z510 Encounter for antineoplastic radiation therapy: Secondary | ICD-10-CM | POA: Diagnosis not present

## 2015-10-24 DIAGNOSIS — F17210 Nicotine dependence, cigarettes, uncomplicated: Secondary | ICD-10-CM | POA: Diagnosis not present

## 2015-10-24 DIAGNOSIS — D649 Anemia, unspecified: Secondary | ICD-10-CM | POA: Diagnosis not present

## 2015-10-24 DIAGNOSIS — R51 Headache: Secondary | ICD-10-CM | POA: Diagnosis not present

## 2015-10-24 DIAGNOSIS — Z880 Allergy status to penicillin: Secondary | ICD-10-CM | POA: Diagnosis not present

## 2015-10-24 DIAGNOSIS — R011 Cardiac murmur, unspecified: Secondary | ICD-10-CM | POA: Diagnosis not present

## 2015-10-24 NOTE — Progress Notes (Signed)
Does patient have an allergy to IV contrast dye?: No   Has patient ever received premedication for IV contrast dye?: No  Does patient take metformin?: No  If patient does take metformin when was the last dose: No  Date of lab work: :10-19-15 BUN: 15.2 CR: 1.0  IV site: antecubital left, condition patent and no redness  Prior to access two sticks  attempted right anterior hand and left antecubital space.  BP 116/55 mmHg  Pulse 78  Temp(Src) 98.3 F (36.8 C) (Oral)  Resp 16  Ht 5\' 1"  (1.549 m)  Wt 155 lb 1.6 oz (70.353 kg)  BMI 29.32 kg/m2  SpO2 100%  LMP 08/01/2011

## 2015-10-24 NOTE — Progress Notes (Signed)
  Radiation Oncology         [phone]) [phone] ________________________________  Name: Karina Briggs MRN: [id number]  Date: 10/24/2015  DOB: 1960-10-18  SIMULATION AND TREATMENT PLANNING NOTE    ICD-9-CM ICD-10-CM   1. Endometrial cancer (HCC) 182.0 C54.1   2. 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  NARRATIVE:  The patient was brought to the [facility].  Identity was confirmed.  All relevant records and images related to the planned course of therapy were reviewed.  The patient freely provided informed written consent to proceed with treatment after reviewing the details related to the planned course of therapy. The consent form was witnessed and verified by the simulation staff.  Then, the patient was set-up in a stable reproducible  supine position for radiation therapy.  CT images were obtained.  Surface markings were placed.  The CT images were loaded into the planning software.  Then the target and avoidance structures were contoured.  Treatment planning then occurred.  The radiation prescription was entered and confirmed.  Then, I designed and supervised the construction of a total of 1 medically necessary complex treatment devices.  I have requested : Intensity Modulated Radiotherapy (IMRT) is medically necessary for this case for the following reason:  Small bowel and bone marrow sparing..  I have ordered:dose calc.  PLAN:  The patient will receive 45 Gy in 25 fractions Followed by intracavitary brachytherapy treatments 4 Using iridium 192 as the high-dose-rate source. The patient will be treated with segmented vaginal cylinder or possibly be the capri applicator. Patient will also be treated with radiosensitizing  chemotherapy.  ________________________________   Blair Promise, PhD, MD    This document serves as a record of services personally performed by Gery Pray, MD. It was created on his behalf by Lendon Collar, a trained medical scribe. The creation of this record is based on the scribe's personal observations and the provider's statements to them. This document has been checked and approved by the attending provider.

## 2015-10-30 ENCOUNTER — Encounter: Payer: Self-pay | Admitting: Gynecologic Oncology

## 2015-10-30 ENCOUNTER — Encounter: Payer: Self-pay | Admitting: Genetic Counselor

## 2015-10-30 DIAGNOSIS — R51 Headache: Secondary | ICD-10-CM | POA: Diagnosis not present

## 2015-10-30 DIAGNOSIS — R011 Cardiac murmur, unspecified: Secondary | ICD-10-CM | POA: Diagnosis not present

## 2015-10-30 DIAGNOSIS — D649 Anemia, unspecified: Secondary | ICD-10-CM | POA: Diagnosis not present

## 2015-10-30 DIAGNOSIS — C541 Malignant neoplasm of endometrium: Secondary | ICD-10-CM | POA: Diagnosis not present

## 2015-10-30 DIAGNOSIS — F17210 Nicotine dependence, cigarettes, uncomplicated: Secondary | ICD-10-CM | POA: Diagnosis not present

## 2015-10-30 DIAGNOSIS — Z880 Allergy status to penicillin: Secondary | ICD-10-CM | POA: Diagnosis not present

## 2015-10-30 DIAGNOSIS — Z510 Encounter for antineoplastic radiation therapy: Secondary | ICD-10-CM | POA: Diagnosis not present

## 2015-10-30 DIAGNOSIS — C7982 Secondary malignant neoplasm of genital organs: Secondary | ICD-10-CM | POA: Diagnosis not present

## 2015-10-30 NOTE — Progress Notes (Signed)
Gynecologic Oncology Multi-Disciplinary Disposition Conference Note  Date of the Conference: October 30, 2015  Patient Name: Karina Briggs  Referring Provider: Dr. Nori Riis  Primary GYN Oncologist: Dr. [name]  Stage/Disposition:  Recurrent Endometrioid Endometrial Cancer.  Disposition is to chemoradiation including external beam and brachytherapy with weekly radiosensitizing cddp.   This Multidisciplinary conference took place involving physicians from [place], [place], Radiation Oncology, Pathology, Radiology along with the Gynecologic Oncology Nurse Practitioner and RN.  Comprehensive assessment of the patient's malignancy, staging, need for surgery, chemotherapy, radiation therapy, and need for further testing were reviewed. Supportive measures, both inpatient and following discharge were also discussed. The recommended plan of care is documented. Greater than 35 minutes were spent correlating and coordinating this patient's care.

## 2015-11-01 ENCOUNTER — Other Ambulatory Visit: Payer: Self-pay | Admitting: *Deleted

## 2015-11-01 ENCOUNTER — Telehealth: Payer: Self-pay | Admitting: *Deleted

## 2015-11-01 NOTE — Telephone Encounter (Signed)
Reviewed CDDP pre hydration with patient. Pt verbalized understanding. Discussed zofran and compazine. Pt would like to see [facility] dietician. Message left for Barb.

## 2015-11-02 ENCOUNTER — Ambulatory Visit
Admission: RE | Admit: 2015-11-02 | Discharge: 2015-11-02 | Disposition: A | Discharge status: Home / Self Care | Payer: BLUE CROSS/BLUE SHIELD | Source: Ambulatory Visit | Attending: Radiation Oncology | Admitting: Radiation Oncology

## 2015-11-02 DIAGNOSIS — F17210 Nicotine dependence, cigarettes, uncomplicated: Secondary | ICD-10-CM | POA: Diagnosis not present

## 2015-11-02 DIAGNOSIS — C7982 Secondary malignant neoplasm of genital organs: Secondary | ICD-10-CM | POA: Diagnosis not present

## 2015-11-02 DIAGNOSIS — D649 Anemia, unspecified: Secondary | ICD-10-CM | POA: Diagnosis not present

## 2015-11-02 DIAGNOSIS — R51 Headache: Secondary | ICD-10-CM | POA: Diagnosis not present

## 2015-11-02 DIAGNOSIS — Z510 Encounter for antineoplastic radiation therapy: Secondary | ICD-10-CM | POA: Diagnosis not present

## 2015-11-02 DIAGNOSIS — Z880 Allergy status to penicillin: Secondary | ICD-10-CM | POA: Diagnosis not present

## 2015-11-02 DIAGNOSIS — C541 Malignant neoplasm of endometrium: Secondary | ICD-10-CM

## 2015-11-02 DIAGNOSIS — R011 Cardiac murmur, unspecified: Secondary | ICD-10-CM | POA: Diagnosis not present

## 2015-11-02 NOTE — Progress Notes (Signed)
  Radiation Oncology         [phone] ________________________________  Name: Karina Briggs MRN: [id number]  Date: 11/02/2015  DOB: Oct 24, 1960  Simulation Verification Note    ICD-9-CM ICD-10-CM   1. Endometrial cancer (HCC) 182.0 C54.1   2. 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 The clinical setup was verified. Then port films were obtained and uploaded to the radiation oncology medical record software.  The treatment beams were carefully compared against the planned radiation fields. The position location and shape of the radiation fields was reviewed. They targeted volume of tissue appears to be appropriately covered by the radiation beams. Organs at risk appear to be excluded as planned.  Based on my personal review, I approved the simulation verification. The patient's treatment will proceed as planned.  -----------------------------------  Blair Promise, PhD, MD

## 2015-11-03 ENCOUNTER — Ambulatory Visit
Admission: RE | Admit: 2015-11-03 | Discharge: 2015-11-03 | Disposition: A | Discharge status: Home / Self Care | Payer: BLUE CROSS/BLUE SHIELD | Source: Ambulatory Visit | Attending: Radiation Oncology | Admitting: Radiation Oncology

## 2015-11-03 ENCOUNTER — Inpatient Hospital Stay: Admission: RE | Admit: 2015-11-03 | Payer: Self-pay | Source: Ambulatory Visit

## 2015-11-03 DIAGNOSIS — F17210 Nicotine dependence, cigarettes, uncomplicated: Secondary | ICD-10-CM | POA: Diagnosis not present

## 2015-11-03 DIAGNOSIS — Z880 Allergy status to penicillin: Secondary | ICD-10-CM | POA: Diagnosis not present

## 2015-11-03 DIAGNOSIS — D649 Anemia, unspecified: Secondary | ICD-10-CM | POA: Diagnosis not present

## 2015-11-03 DIAGNOSIS — Z510 Encounter for antineoplastic radiation therapy: Secondary | ICD-10-CM | POA: Diagnosis not present

## 2015-11-03 DIAGNOSIS — R51 Headache: Secondary | ICD-10-CM | POA: Diagnosis not present

## 2015-11-03 DIAGNOSIS — R011 Cardiac murmur, unspecified: Secondary | ICD-10-CM | POA: Diagnosis not present

## 2015-11-03 DIAGNOSIS — C541 Malignant neoplasm of endometrium: Secondary | ICD-10-CM | POA: Diagnosis not present

## 2015-11-03 DIAGNOSIS — C7982 Secondary malignant neoplasm of genital organs: Secondary | ICD-10-CM | POA: Diagnosis not present

## 2015-11-06 ENCOUNTER — Ambulatory Visit: Payer: BLUE CROSS/BLUE SHIELD

## 2015-11-06 ENCOUNTER — Ambulatory Visit (HOSPITAL_BASED_OUTPATIENT_CLINIC_OR_DEPARTMENT_OTHER): Payer: BLUE CROSS/BLUE SHIELD

## 2015-11-06 ENCOUNTER — Ambulatory Visit
Admission: RE | Admit: 2015-11-06 | Discharge: 2015-11-06 | Disposition: A | Discharge status: Home / Self Care | Payer: BLUE CROSS/BLUE SHIELD | Source: Ambulatory Visit | Attending: Radiation Oncology | Admitting: Radiation Oncology

## 2015-11-06 ENCOUNTER — Other Ambulatory Visit (HOSPITAL_BASED_OUTPATIENT_CLINIC_OR_DEPARTMENT_OTHER): Payer: BLUE CROSS/BLUE SHIELD

## 2015-11-06 ENCOUNTER — Ambulatory Visit: Payer: BLUE CROSS/BLUE SHIELD | Admitting: Nutrition

## 2015-11-06 VITALS — BP 105/74 | HR 61 | Temp 98.4°F | Resp 21

## 2015-11-06 DIAGNOSIS — C7982 Secondary malignant neoplasm of genital organs: Secondary | ICD-10-CM | POA: Diagnosis not present

## 2015-11-06 DIAGNOSIS — Z880 Allergy status to penicillin: Secondary | ICD-10-CM | POA: Diagnosis not present

## 2015-11-06 DIAGNOSIS — D649 Anemia, unspecified: Secondary | ICD-10-CM | POA: Diagnosis not present

## 2015-11-06 DIAGNOSIS — Z5111 Encounter for antineoplastic chemotherapy: Secondary | ICD-10-CM

## 2015-11-06 DIAGNOSIS — R011 Cardiac murmur, unspecified: Secondary | ICD-10-CM | POA: Diagnosis not present

## 2015-11-06 DIAGNOSIS — C541 Malignant neoplasm of endometrium: Secondary | ICD-10-CM | POA: Diagnosis not present

## 2015-11-06 DIAGNOSIS — Z510 Encounter for antineoplastic radiation therapy: Secondary | ICD-10-CM | POA: Diagnosis not present

## 2015-11-06 DIAGNOSIS — F17210 Nicotine dependence, cigarettes, uncomplicated: Secondary | ICD-10-CM | POA: Diagnosis not present

## 2015-11-06 DIAGNOSIS — R51 Headache: Secondary | ICD-10-CM | POA: Diagnosis not present

## 2015-11-06 LAB — CBC WITH DIFFERENTIAL/PLATELET
BASO%: 1 % (ref 0.0–2.0)
BASOS ABS: 0 10*3/uL (ref 0.0–0.1)
EOS%: 5.4 % (ref 0.0–7.0)
Eosinophils Absolute: 0.3 10*3/uL (ref 0.0–0.5)
HCT: 42 % (ref 34.8–46.6)
HEMOGLOBIN: 14 g/dL (ref 11.6–15.9)
LYMPH%: 27.9 % (ref 14.0–49.7)
MCH: 31.3 pg (ref 25.1–34.0)
MCHC: 33.4 g/dL (ref 31.5–36.0)
MCV: 93.8 fL (ref 79.5–101.0)
MONO#: 0.4 10*3/uL (ref 0.1–0.9)
MONO%: 8.6 % (ref 0.0–14.0)
NEUT#: 2.9 10*3/uL (ref 1.5–6.5)
NEUT%: 57.1 % (ref 38.4–76.8)
Platelets: 222 10*3/uL (ref 145–400)
RBC: 4.48 10*6/uL (ref 3.70–5.45)
RDW: 13.2 % (ref 11.2–14.5)
WBC: 5.1 10*3/uL (ref 3.9–10.3)
lymph#: 1.4 10*3/uL (ref 0.9–3.3)

## 2015-11-06 LAB — COMPREHENSIVE METABOLIC PANEL
ALBUMIN: 3.8 g/dL (ref 3.5–5.0)
ALK PHOS: 72 U/L (ref 40–150)
ALT: 15 U/L (ref 0–55)
AST: 11 U/L (ref 5–34)
Anion Gap: 10 mEq/L (ref 3–11)
BUN: 11.4 mg/dL (ref 7.0–26.0)
CHLORIDE: 107 meq/L (ref 98–109)
CO2: 25 mEq/L (ref 22–29)
Calcium: 9.6 mg/dL (ref 8.4–10.4)
Creatinine: 0.8 mg/dL (ref 0.6–1.1)
EGFR: 79 mL/min/{1.73_m2} — ABNORMAL LOW (ref >90)
GLUCOSE: 108 mg/dL (ref 70–140)
POTASSIUM: 4 meq/L (ref 3.5–5.1)
Sodium: 142 mEq/L (ref 136–145)
Total Bilirubin: 0.53 mg/dL (ref 0.20–1.20)
Total Protein: 6.7 g/dL (ref 6.4–8.3)

## 2015-11-06 LAB — MAGNESIUM: Magnesium: 2.1 mg/dl (ref 1.5–2.5)

## 2015-11-06 MED ORDER — SODIUM CHLORIDE 0.9 % IV SOLN
Freq: Once | INTRAVENOUS | Status: AC
  Administered 2015-11-06: 13:00:00 via INTRAVENOUS
  Filled 2015-11-06: qty 4

## 2015-11-06 MED ORDER — CISPLATIN CHEMO INJECTION 100MG/100ML
40.0000 mg/m2 | Freq: Once | INTRAVENOUS | Status: AC
  Administered 2015-11-06: 69 mg via INTRAVENOUS
  Filled 2015-11-06: qty 69

## 2015-11-06 MED ORDER — SODIUM CHLORIDE 0.9 % IV SOLN
Freq: Once | INTRAVENOUS | Status: AC
  Administered 2015-11-06: 13:00:00 via INTRAVENOUS
  Filled 2015-11-06: qty 5

## 2015-11-06 MED ORDER — SODIUM CHLORIDE 0.9 % IV SOLN
Freq: Once | INTRAVENOUS | Status: AC
  Administered 2015-11-06: 10:00:00 via INTRAVENOUS

## 2015-11-06 MED ORDER — POTASSIUM CHLORIDE 2 MEQ/ML IV SOLN
Freq: Once | INTRAVENOUS | Status: AC
  Administered 2015-11-06: 11:00:00 via INTRAVENOUS
  Filled 2015-11-06: qty 10

## 2015-11-06 NOTE — Progress Notes (Signed)
55 year old female diagnosed with endometrial cancer.  She is a patient of Dr. Marko Plume.  Past medical history includes heart murmur and anemia.  Medications include vitamin D, Zofran, and Compazine.  Labs were reviewed.  Height: 61 inches. Weight: 155.1 pounds July 18. BMI: 29.3 to  Patient requesting nutrition consult.  She is interested in what foods she should be eating during treatment. Reports she tends to be constipated and often has to take MiraLAX. She tries to eat a healthy diet.  Nutrition diagnosis: Food and nutrition related knowledge deficit related to endometrial cancer and associated treatments as evidenced by no prior need for nutrition related information.  Intervention: Educated patient to continue healthy plant-based diet with adequate protein to promote weight maintenance. Reviewed low fiber diet if patient develops diarrhea and provided fact sheet. Also educated patient on strategies for eating if she develops nausea or vomiting. Questions were answered.  Teach back method used.  Monitoring, evaluation, goals:  Patient will tolerate adequate calories and protein to minimize weight loss.  Next visit: Monday, August 7, during infusion.  **Disclaimer: This note was dictated with voice recognition software. Similar sounding words can inadvertently be transcribed and this note may contain transcription errors which may not have been corrected upon publication of note.**

## 2015-11-06 NOTE — Patient Instructions (Addendum)
Cisplatin injection What is this medicine? CISPLATIN (SIS pla tin) is a chemotherapy drug. It targets fast dividing cells, like cancer cells, and causes these cells to die. This medicine is used to treat many types of cancer like bladder, ovarian, and testicular cancers. This medicine may be used for other purposes; ask your health care provider or pharmacist if you have questions. What should I tell my health care provider before I take this medicine? They need to know if you have any of these conditions: -blood disorders -hearing problems -kidney disease -recent or ongoing radiation therapy -an unusual or allergic reaction to cisplatin, carboplatin, other chemotherapy, other medicines, foods, dyes, or preservatives -pregnant or trying to get pregnant -breast-feeding How should I use this medicine? This drug is given as an infusion into a vein. It is administered in a hospital or clinic by a specially trained health care professional. Talk to your pediatrician regarding the use of this medicine in children. Special care may be needed. Overdosage: If you think you have taken too much of this medicine contact a poison control center or emergency room at once. NOTE: This medicine is only for you. Do not share this medicine with others. What if I miss a dose? It is important not to miss a dose. Call your doctor or health care professional if you are unable to keep an appointment. What may interact with this medicine? -dofetilide -foscarnet -medicines for seizures -medicines to increase blood counts like filgrastim, pegfilgrastim, sargramostim -probenecid -pyridoxine used with altretamine -rituximab -some antibiotics like amikacin, gentamicin, neomycin, polymyxin B, streptomycin, tobramycin -sulfinpyrazone -vaccines -zalcitabine Talk to your doctor or health care professional before taking any of these medicines: -acetaminophen -aspirin -ibuprofen -ketoprofen -naproxen This list may  not describe all possible interactions. Give your health care provider a list of all the medicines, herbs, non-prescription drugs, or dietary supplements you use. Also tell them if you smoke, drink alcohol, or use illegal drugs. Some items may interact with your medicine. What should I watch for while using this medicine? Your condition will be monitored carefully while you are receiving this medicine. You will need important blood work done while you are taking this medicine. This drug may make you feel generally unwell. This is not uncommon, as chemotherapy can affect healthy cells as well as cancer cells. Report any side effects. Continue your course of treatment even though you feel ill unless your doctor tells you to stop. In some cases, you may be given additional medicines to help with side effects. Follow all directions for their use. Call your doctor or health care professional for advice if you get a fever, chills or sore throat, or other symptoms of a cold or flu. Do not treat yourself. This drug decreases your body's ability to fight infections. Try to avoid being around people who are sick. This medicine may increase your risk to bruise or bleed. Call your doctor or health care professional if you notice any unusual bleeding. Be careful brushing and flossing your teeth or using a toothpick because you may get an infection or bleed more easily. If you have any dental work done, tell your dentist you are receiving this medicine. Avoid taking products that contain aspirin, acetaminophen, ibuprofen, naproxen, or ketoprofen unless instructed by your doctor. These medicines may hide a fever. Do not become pregnant while taking this medicine. Women should inform their doctor if they wish to become pregnant or think they might be pregnant. There is a potential for serious side effects to   an unborn child. Talk to your health care professional or pharmacist for more information. Do not breast-feed an  infant while taking this medicine. Drink fluids as directed while you are taking this medicine. This will help protect your kidneys. Call your doctor or health care professional if you get diarrhea. Do not treat yourself. What side effects may I notice from receiving this medicine? Side effects that you should report to your doctor or health care professional as soon as possible: -allergic reactions like skin rash, itching or hives, swelling of the face, lips, or tongue -signs of infection - fever or chills, cough, sore throat, pain or difficulty passing urine -signs of decreased platelets or bleeding - bruising, pinpoint red spots on the skin, black, tarry stools, nosebleeds -signs of decreased red blood cells - unusually weak or tired, fainting spells, lightheadedness -breathing problems -changes in hearing -gout pain -low blood counts - This drug may decrease the number of white blood cells, red blood cells and platelets. You may be at increased risk for infections and bleeding. -nausea and vomiting -pain, swelling, redness or irritation at the injection site -pain, tingling, numbness in the hands or feet -problems with balance, movement -trouble passing urine or change in the amount of urine Side effects that usually do not require medical attention (report to your doctor or health care professional if they continue or are bothersome): -changes in vision -loss of appetite -metallic taste in the mouth or changes in taste This list may not describe all possible side effects. Call your doctor for medical advice about side effects. You may report side effects to FDA at 1-800-FDA-1088. Where should I keep my medicine? This drug is given in a hospital or clinic and will not be stored at home. NOTE: This sheet is a summary. It may not cover all possible information. If you have questions about this medicine, talk to your doctor, pharmacist, or health care provider.    2016, [employer]vier/Gold  Standard. (2007-06-30 14:40:54)    [facility] Discharge Instructions for Patients Receiving Chemotherapy  Today you received the following chemotherapy agents  Cisplatin.  To help prevent nausea and vomiting after your treatment, we encourage you to take your Compazine and Zofran as ordered  If you develop nausea and vomiting that is not controlled by your nausea medication, call the clinic.   BELOW ARE SYMPTOMS THAT SHOULD BE REPORTED IMMEDIATELY:  *FEVER GREATER THAN 100.5 F  *CHILLS WITH OR WITHOUT FEVER  NAUSEA AND VOMITING THAT IS NOT CONTROLLED WITH YOUR NAUSEA MEDICATION  *UNUSUAL SHORTNESS OF BREATH  *UNUSUAL BRUISING OR BLEEDING  TENDERNESS IN MOUTH AND THROAT WITH OR WITHOUT PRESENCE OF ULCERS  *URINARY PROBLEMS  *BOWEL PROBLEMS  UNUSUAL RASH Items with * indicate a potential emergency and should be followed up as soon as possible.  Feel free to call the clinic you have any questions or concerns. The clinic phone number is [phone].  Please show the [place] at check-in to the Emergency Department and triage nurse.

## 2015-11-07 ENCOUNTER — Encounter: Payer: Self-pay | Admitting: Radiation Oncology

## 2015-11-07 ENCOUNTER — Ambulatory Visit
Admission: RE | Admit: 2015-11-07 | Discharge: 2015-11-07 | Disposition: A | Discharge status: Home / Self Care | Payer: BLUE CROSS/BLUE SHIELD | Source: Ambulatory Visit | Attending: Radiation Oncology | Admitting: Radiation Oncology

## 2015-11-07 ENCOUNTER — Telehealth: Payer: Self-pay | Admitting: *Deleted

## 2015-11-07 VITALS — BP 94/79 | HR 64 | Temp 97.9°F | Ht 61.0 in | Wt 159.0 lb

## 2015-11-07 DIAGNOSIS — D649 Anemia, unspecified: Secondary | ICD-10-CM | POA: Diagnosis not present

## 2015-11-07 DIAGNOSIS — C7982 Secondary malignant neoplasm of genital organs: Secondary | ICD-10-CM

## 2015-11-07 DIAGNOSIS — C541 Malignant neoplasm of endometrium: Secondary | ICD-10-CM | POA: Diagnosis not present

## 2015-11-07 DIAGNOSIS — R011 Cardiac murmur, unspecified: Secondary | ICD-10-CM | POA: Diagnosis not present

## 2015-11-07 DIAGNOSIS — Z510 Encounter for antineoplastic radiation therapy: Secondary | ICD-10-CM | POA: Diagnosis not present

## 2015-11-07 DIAGNOSIS — R51 Headache: Secondary | ICD-10-CM | POA: Diagnosis not present

## 2015-11-07 DIAGNOSIS — Z880 Allergy status to penicillin: Secondary | ICD-10-CM | POA: Diagnosis not present

## 2015-11-07 DIAGNOSIS — F17210 Nicotine dependence, cigarettes, uncomplicated: Secondary | ICD-10-CM | POA: Diagnosis not present

## 2015-11-07 NOTE — Progress Notes (Signed)
  Radiation Oncology         [phone] ________________________________  Name: Karina Briggs MRN: [id number]  Date: 11/07/2015  DOB: 1960/08/15  Weekly Radiation Therapy Management    ICD-9-CM ICD-10-CM   1. 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 . . . . . . . The patient presents for routine under treatment assessment.                              Karina Briggs has completed 4 fractions to her pelvis.  She denies having pain, other than a slight headache.  She denies having bladder/bowel issues, vaginal bleeding, or nausea.  She reports having slight fatigue.  She reports having heartburn today.  She had chemotherapy yesterday.                                 Set-up films were reviewed.                                 The chart was checked. Physical Findings. . .  height is 5\' 1"  (1.549 m) and weight is 159 lb (72.1 kg). Her oral temperature is 97.9 F (36.6 C). Her blood pressure is 94/79 and her pulse is 64. Her oxygen saturation is 100%.   Lungs are clear to auscultation bilaterally. Heart has regular rate and rhythm.  Impression . . . . . . . The patient is tolerating radiation. Plan . . . . . . . . . . . . Continue treatment as planned.  ________________________________   Blair Promise, PhD, MD  This document serves as a record of services personally performed by Gery Pray, MD. It was created on his behalf by Darcus Austin, a trained medical scribe. The creation of this record is based on the scribe's personal observations and the provider's statements to them. This document has been checked and approved by the attending provider.

## 2015-11-07 NOTE — Telephone Encounter (Signed)
Patient called reporting she "received first chemotherapy yesterday.  Doing well but have a headache.  What can I take for headache?"  Advised Tylenol or ibuprofen.  Further chemotherapy F/U assessment done.  No N/V or bowel and bladder changes or problems.  Eating and drinking well.  "I've just gone two days without caffeine so I have a headache."  Denies further questions.

## 2015-11-07 NOTE — Progress Notes (Signed)
Karina Briggs has completed 4 fractions to her pelvis.  She denies having pain other than a slight headache.  She denies having bladder/bowel issues or vaginal bleeding.  She reports having slight fatigue.  She reports having heartburn today and denies having nausea.  She had chemotherapy yesterday.  BP 94/79 (BP Location: Right Arm, Patient Position: Sitting)   Pulse 64   Temp 97.9 F (36.6 C) (Oral)   Ht 5\' 1"  (1.549 m)   Wt 159 lb (72.1 kg)   LMP 08/01/2011   SpO2 100%   BMI 30.04 kg/m    Wt Readings from Last 3 Encounters:  11/07/15 159 lb (72.1 kg)  10/24/15 155 lb 1.6 oz (70.4 kg)  10/19/15 154 lb 3.2 oz (69.9 kg)

## 2015-11-07 NOTE — Telephone Encounter (Signed)
-----   Message from Cherylynn Ridges, RN sent at 11/07/2015 10:04 AM EDT ----- Regarding: Chemotherapy F/U Dr.Livesay, 1ST CISPLATIN

## 2015-11-08 ENCOUNTER — Ambulatory Visit
Admission: RE | Admit: 2015-11-08 | Discharge: 2015-11-08 | Disposition: A | Discharge status: Home / Self Care | Payer: BLUE CROSS/BLUE SHIELD | Source: Ambulatory Visit | Attending: Radiation Oncology | Admitting: Radiation Oncology

## 2015-11-08 ENCOUNTER — Telehealth: Payer: Self-pay

## 2015-11-08 DIAGNOSIS — Z510 Encounter for antineoplastic radiation therapy: Secondary | ICD-10-CM | POA: Diagnosis not present

## 2015-11-08 DIAGNOSIS — C541 Malignant neoplasm of endometrium: Secondary | ICD-10-CM | POA: Diagnosis not present

## 2015-11-08 DIAGNOSIS — Z880 Allergy status to penicillin: Secondary | ICD-10-CM | POA: Diagnosis not present

## 2015-11-08 DIAGNOSIS — D649 Anemia, unspecified: Secondary | ICD-10-CM | POA: Diagnosis not present

## 2015-11-08 DIAGNOSIS — C7982 Secondary malignant neoplasm of genital organs: Secondary | ICD-10-CM | POA: Diagnosis not present

## 2015-11-08 DIAGNOSIS — R51 Headache: Secondary | ICD-10-CM | POA: Diagnosis not present

## 2015-11-08 DIAGNOSIS — R011 Cardiac murmur, unspecified: Secondary | ICD-10-CM | POA: Diagnosis not present

## 2015-11-08 DIAGNOSIS — F17210 Nicotine dependence, cigarettes, uncomplicated: Secondary | ICD-10-CM | POA: Diagnosis not present

## 2015-11-08 NOTE — Telephone Encounter (Signed)
  Karina Briggs Female, 56 y.o., 30-Jul-1960 Weight:  159 lb (72.1 kg) Phone:  [phone] PCP:  [name], MD MRN:  [id number] MyChart:  Active Next Appt:  11/09/2015 Message  Received: Today  Message Contents  Gordy Levan, MD  Baruch Merl, RN        Note from RN seen.   If HA began after zofran, would stop zofran. Can use benadryl 25 mg q 4 hrs 1-2x to see if that helps, if it seems zofran related and within 12 hrs of last zofran dose.  She has compazine listed on meds, may be better to use than zofran.  Fine to use either OTC pepcid bid or prescription for protonix 40 mg daily for GERD, #30 if script.  Agree with approach to bowels.   thanks

## 2015-11-08 NOTE — Telephone Encounter (Signed)
Spoke with Karina Briggs in radiation  regarding  acid reflux.  She has used the compazine for nausea.  She has taken Mylanta and Tums with minimal effect. What does Dr. Marko Plume recommend?  The H/A is decreased with tylenol but lingers and increases in intensity when tylenol wears off~3 hrs. She is experiencing sinus pressure /H/A under her eyes and forehead.  No sinus drainage. Afebrile 97.8. She wanted to know if she could use Sudafed to help her sinus pressure.  Karina Briggs will use Miralax  Today as she has not moved bowels since Monday 11-06-15.  This works for her usually.  Suggested she take the stool softener daily and miralax 1-2 times daily prn. She can decreas use if diarrhea occurs from radiation.

## 2015-11-08 NOTE — Telephone Encounter (Signed)
Discussed the suggestions noted below with Dr. Marko Plume. Karina Briggs has not used the Zofran at all.  She is using the compazine. She will use the Claritin for now and will add Sudafed 30 mg tab if needed. She wants to try the Pepcid OTC to start.  She will let Dr. Marko Plume know if she needs the Prescription strength. She already has taken the Miralax.  She knows to call if she has any further questions or concerns.

## 2015-11-08 NOTE — Telephone Encounter (Signed)
  Karina Briggs Female, 55 y.o., [birth year] Weight:  159 lb (72.1 kg) Phone:  [phone] PCP:  [name], MD MRN:  [id number] MyChart:  Active Next Appt:  11/09/2015 Message  Received: Today  Message Contents  Gordy Levan, MD  Baruch Merl, RN        Message 2  Re sudafed for sinus pressure - IF she has used that in past and tolerates, ok to try. Tell her that sudafed can cause tachycardia.  Or could try claritin.   thanks

## 2015-11-08 NOTE — Progress Notes (Signed)
Patient reports having heartburn since having chemotherapy on Monday.  She has taken Mylanta with relief for a short time but the heartburn comes right back.  She also reports having constipation without a bowel movement for 3 days.  She said this is not unusual for her and she has been eating a low residue diet for the past few days.  She also reports having a headache that feels more like a sinus headache today.  She is wondering if she can take sudafed.  Lindell Noe, RN with Dr. Mariana Kaufman office and left a message to discuss symptoms.

## 2015-11-08 NOTE — Progress Notes (Addendum)
error 

## 2015-11-09 ENCOUNTER — Ambulatory Visit
Admission: RE | Admit: 2015-11-09 | Discharge: 2015-11-09 | Disposition: A | Discharge status: Home / Self Care | Payer: BLUE CROSS/BLUE SHIELD | Source: Ambulatory Visit | Attending: Radiation Oncology | Admitting: Radiation Oncology

## 2015-11-09 DIAGNOSIS — R011 Cardiac murmur, unspecified: Secondary | ICD-10-CM | POA: Diagnosis not present

## 2015-11-09 DIAGNOSIS — Z510 Encounter for antineoplastic radiation therapy: Secondary | ICD-10-CM | POA: Diagnosis not present

## 2015-11-09 DIAGNOSIS — Z880 Allergy status to penicillin: Secondary | ICD-10-CM | POA: Diagnosis not present

## 2015-11-09 DIAGNOSIS — C541 Malignant neoplasm of endometrium: Secondary | ICD-10-CM | POA: Diagnosis not present

## 2015-11-09 DIAGNOSIS — C7982 Secondary malignant neoplasm of genital organs: Secondary | ICD-10-CM | POA: Diagnosis not present

## 2015-11-09 DIAGNOSIS — D649 Anemia, unspecified: Secondary | ICD-10-CM | POA: Diagnosis not present

## 2015-11-09 DIAGNOSIS — F17210 Nicotine dependence, cigarettes, uncomplicated: Secondary | ICD-10-CM | POA: Diagnosis not present

## 2015-11-09 DIAGNOSIS — R51 Headache: Secondary | ICD-10-CM | POA: Diagnosis not present

## 2015-11-10 ENCOUNTER — Ambulatory Visit
Admission: RE | Admit: 2015-11-10 | Discharge: 2015-11-10 | Disposition: A | Discharge status: Home / Self Care | Payer: BLUE CROSS/BLUE SHIELD | Source: Ambulatory Visit | Attending: Radiation Oncology | Admitting: Radiation Oncology

## 2015-11-10 DIAGNOSIS — F17210 Nicotine dependence, cigarettes, uncomplicated: Secondary | ICD-10-CM | POA: Diagnosis not present

## 2015-11-10 DIAGNOSIS — R51 Headache: Secondary | ICD-10-CM | POA: Diagnosis not present

## 2015-11-10 DIAGNOSIS — R011 Cardiac murmur, unspecified: Secondary | ICD-10-CM | POA: Diagnosis not present

## 2015-11-10 DIAGNOSIS — C7982 Secondary malignant neoplasm of genital organs: Secondary | ICD-10-CM | POA: Diagnosis not present

## 2015-11-10 DIAGNOSIS — Z880 Allergy status to penicillin: Secondary | ICD-10-CM | POA: Diagnosis not present

## 2015-11-10 DIAGNOSIS — Z510 Encounter for antineoplastic radiation therapy: Secondary | ICD-10-CM | POA: Diagnosis not present

## 2015-11-10 DIAGNOSIS — D649 Anemia, unspecified: Secondary | ICD-10-CM | POA: Diagnosis not present

## 2015-11-10 DIAGNOSIS — C541 Malignant neoplasm of endometrium: Secondary | ICD-10-CM | POA: Diagnosis not present

## 2015-11-12 ENCOUNTER — Other Ambulatory Visit: Payer: Self-pay | Admitting: Oncology

## 2015-11-13 ENCOUNTER — Ambulatory Visit (HOSPITAL_BASED_OUTPATIENT_CLINIC_OR_DEPARTMENT_OTHER): Payer: BLUE CROSS/BLUE SHIELD | Admitting: Oncology

## 2015-11-13 ENCOUNTER — Ambulatory Visit
Admission: RE | Admit: 2015-11-13 | Discharge: 2015-11-13 | Disposition: A | Discharge status: Home / Self Care | Payer: BLUE CROSS/BLUE SHIELD | Source: Ambulatory Visit | Attending: Radiation Oncology | Admitting: Radiation Oncology

## 2015-11-13 ENCOUNTER — Ambulatory Visit: Payer: BLUE CROSS/BLUE SHIELD | Admitting: Nutrition

## 2015-11-13 ENCOUNTER — Ambulatory Visit (HOSPITAL_BASED_OUTPATIENT_CLINIC_OR_DEPARTMENT_OTHER): Payer: BLUE CROSS/BLUE SHIELD

## 2015-11-13 ENCOUNTER — Encounter: Payer: Self-pay | Admitting: Oncology

## 2015-11-13 VITALS — BP 122/79 | HR 65 | Temp 98.0°F | Resp 18

## 2015-11-13 VITALS — BP 105/79 | HR 64 | Temp 98.6°F | Resp 18 | Ht 61.0 in | Wt 154.1 lb

## 2015-11-13 DIAGNOSIS — F17210 Nicotine dependence, cigarettes, uncomplicated: Secondary | ICD-10-CM | POA: Diagnosis not present

## 2015-11-13 DIAGNOSIS — Z5111 Encounter for antineoplastic chemotherapy: Secondary | ICD-10-CM

## 2015-11-13 DIAGNOSIS — C7982 Secondary malignant neoplasm of genital organs: Secondary | ICD-10-CM

## 2015-11-13 DIAGNOSIS — C541 Malignant neoplasm of endometrium: Secondary | ICD-10-CM

## 2015-11-13 DIAGNOSIS — Z510 Encounter for antineoplastic radiation therapy: Secondary | ICD-10-CM | POA: Diagnosis not present

## 2015-11-13 DIAGNOSIS — R011 Cardiac murmur, unspecified: Secondary | ICD-10-CM | POA: Diagnosis not present

## 2015-11-13 DIAGNOSIS — Z720 Tobacco use: Secondary | ICD-10-CM | POA: Diagnosis not present

## 2015-11-13 DIAGNOSIS — D649 Anemia, unspecified: Secondary | ICD-10-CM | POA: Diagnosis not present

## 2015-11-13 DIAGNOSIS — Z880 Allergy status to penicillin: Secondary | ICD-10-CM | POA: Diagnosis not present

## 2015-11-13 DIAGNOSIS — R51 Headache: Secondary | ICD-10-CM | POA: Diagnosis not present

## 2015-11-13 LAB — COMPREHENSIVE METABOLIC PANEL
ALBUMIN: 3.7 g/dL (ref 3.5–5.0)
ALK PHOS: 65 U/L (ref 40–150)
ALT: 27 U/L (ref 0–55)
AST: 13 U/L (ref 5–34)
Anion Gap: 10 mEq/L (ref 3–11)
BUN: 18 mg/dL (ref 7.0–26.0)
CALCIUM: 9.5 mg/dL (ref 8.4–10.4)
CHLORIDE: 105 meq/L (ref 98–109)
CO2: 23 mEq/L (ref 22–29)
CREATININE: 0.9 mg/dL (ref 0.6–1.1)
EGFR: 76 mL/min/{1.73_m2} — ABNORMAL LOW (ref >90)
GLUCOSE: 97 mg/dL (ref 70–140)
Potassium: 4 mEq/L (ref 3.5–5.1)
SODIUM: 138 meq/L (ref 136–145)
Total Bilirubin: 0.47 mg/dL (ref 0.20–1.20)
Total Protein: 6.6 g/dL (ref 6.4–8.3)

## 2015-11-13 LAB — CBC WITH DIFFERENTIAL/PLATELET
BASO%: 0.4 % (ref 0.0–2.0)
BASOS ABS: 0 10*3/uL (ref 0.0–0.1)
EOS%: 5.7 % (ref 0.0–7.0)
Eosinophils Absolute: 0.2 10*3/uL (ref 0.0–0.5)
HCT: 41.3 % (ref 34.8–46.6)
HGB: 13.8 g/dL (ref 11.6–15.9)
LYMPH#: 0.9 10*3/uL (ref 0.9–3.3)
LYMPH%: 20.9 % (ref 14.0–49.7)
MCH: 31 pg (ref 25.1–34.0)
MCHC: 33.3 g/dL (ref 31.5–36.0)
MCV: 93.1 fL (ref 79.5–101.0)
MONO#: 0.5 10*3/uL (ref 0.1–0.9)
MONO%: 12.1 % (ref 0.0–14.0)
NEUT#: 2.6 10*3/uL (ref 1.5–6.5)
NEUT%: 60.9 % (ref 38.4–76.8)
Platelets: 187 10*3/uL (ref 145–400)
RBC: 4.43 10*6/uL (ref 3.70–5.45)
RDW: 12.9 % (ref 11.2–14.5)
WBC: 4.2 10*3/uL (ref 3.9–10.3)

## 2015-11-13 LAB — MAGNESIUM: MAGNESIUM: 2.2 mg/dL (ref 1.5–2.5)

## 2015-11-13 MED ORDER — SODIUM CHLORIDE 0.9 % IV SOLN
Freq: Once | INTRAVENOUS | Status: AC
  Administered 2015-11-13: 12:00:00 via INTRAVENOUS
  Filled 2015-11-13: qty 4

## 2015-11-13 MED ORDER — SODIUM CHLORIDE 0.9 % IV SOLN
Freq: Once | INTRAVENOUS | Status: AC
  Administered 2015-11-13: 12:00:00 via INTRAVENOUS
  Filled 2015-11-13: qty 5

## 2015-11-13 MED ORDER — SODIUM CHLORIDE 0.9 % IV SOLN
Freq: Once | INTRAVENOUS | Status: AC
  Administered 2015-11-13: 12:00:00 via INTRAVENOUS

## 2015-11-13 MED ORDER — SODIUM CHLORIDE 0.9 % IV SOLN
40.0000 mg/m2 | Freq: Once | INTRAVENOUS | Status: AC
  Administered 2015-11-13: 69 mg via INTRAVENOUS
  Filled 2015-11-13: qty 69

## 2015-11-13 MED ORDER — DEXTROSE-NACL 5-0.45 % IV SOLN
Freq: Once | INTRAVENOUS | Status: AC
  Administered 2015-11-13: 10:00:00 via INTRAVENOUS
  Filled 2015-11-13: qty 10

## 2015-11-13 NOTE — Progress Notes (Signed)
Follow-up completed with patient receiving chemotherapy for endometrial cancer. Weight is stable and documented as 154.1 pounds August 7 from 155.1 pounds July 18. Patient reports she feels well and is eating well. Continues to take MiraLAX for constipation.  Nutrition diagnosis: Food and nutrition related knowledge deficit has resolved.  Encouraged patient to contact me if she develops questions or concerns, during chemotherapy with her oral intake.  She has my contact information.  **Disclaimer: This note was dictated with voice recognition software. Similar sounding words can inadvertently be transcribed and this note may contain transcription errors which may not have been corrected upon publication of note.**

## 2015-11-13 NOTE — Patient Instructions (Signed)
[facility] Discharge Instructions for Patients Receiving Chemotherapy  Today you received the following chemotherapy agents: Cisplatin   To help prevent nausea and vomiting after your treatment, we encourage you to take your nausea medication as directed.    If you develop nausea and vomiting that is not controlled by your nausea medication, call the clinic.   BELOW ARE SYMPTOMS THAT SHOULD BE REPORTED IMMEDIATELY:  *FEVER GREATER THAN 100.5 F  *CHILLS WITH OR WITHOUT FEVER  NAUSEA AND VOMITING THAT IS NOT CONTROLLED WITH YOUR NAUSEA MEDICATION  *UNUSUAL SHORTNESS OF BREATH  *UNUSUAL BRUISING OR BLEEDING  TENDERNESS IN MOUTH AND THROAT WITH OR WITHOUT PRESENCE OF ULCERS  *URINARY PROBLEMS  *BOWEL PROBLEMS  UNUSUAL RASH Items with * indicate a potential emergency and should be followed up as soon as possible.  Feel free to call the clinic you have any questions or concerns. The clinic phone number is [phone].  Please show the CHEMO ALERT CARD at check-in to the Emergency Department and triage nurse.   

## 2015-11-13 NOTE — Progress Notes (Signed)
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 She began external beam radiation on 11-02-15 and had first sensitizing CDDP on 11-06-15. IMRT is planned thur 12-06-15, with weekly CDDP planned thru 12-04-15.   Patient required IV restart x1 during first treatment, tho veins do appear adequate for continued peripheral administration. She had some constipation which resolved with senokot, and she plans to use miralax to keep bowels moving daily now, understands that she may need to hold this further into radiation course. She had some HA beginning day after chemo, had zofran with IV premeds without problems then and had not taken additional zofran by time of the HA. She had stopped caffeine prior to start of chemo, tried coffee x1 which did not seem helpful with HA, also tried ibuprofen and tylenol with some improvement. She had no other neurologic symptoms and HA resolved completely. She had GERD on day 2, this much better with bid OTC Pepcid which she will continue (or can do prescription if preferable with her copay). She is not noticeably more fatigued. She is up more to void at night with hydration. Appetite has been very good. No SOB, no LE swelling. No skin irritation in radiation area. Peripheral IV access seems fine. She has done oral prehydration as instructed for treatment today. She and husband both stopped smoking on 10-27-15.  Remainder of 10 point Review of Systems negative.    No PAC  Husband very supportive.  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 Path findings additionally were of 25% myometrial invasion (5/28 mm), negative sentinel nodes and no LVSI. She did well until postcoital spotting March 2017. She had follow up with Dr Nori Riis in 08-2015, with PAPs x 2 with endometrial cells and nodule found at right vaginal cuff. She saw Dr Denman George 10-06-15, with 1.5 cm nodular lesion at right vaginal fornix, mobile and free from rectal wall but infiltrating ~ 1 cm deep to vaginal mucosa. PET was not approved by insurance; CT CAP 10-18-15 had rounded soft tissue at right vaginal cuff  2.3 x 2.0 cm, with no other evidence of malignancy. Recommendation for external beam radiation with sensitizing CDDP, + vaginal brachytherapy. She began IMRT on 11-02-15 and sensitizing CDDP on 11-06-15.     Objective:  Vital signs in last 24 hours:  BP 105/79 (BP Location: Left Arm, Patient Position: Sitting)   Pulse 64   Temp 98.6 F (37 C) (Oral)   Resp 18   Ht '5\' 1"'  (1.549 m)   Wt 154 lb 1.6 oz (69.9 kg)   LMP 08/01/2011   SpO2 100%   BMI 29.12 kg/m  Weight stable. Looks entirely comfortable Alert, oriented and appropriate, very pleasant. Ambulatory without difficulty.   HEENT:PERRL, sclerae not icteric. Oral mucosa moist without lesions, posterior pharynx clear.  Neck supple. No JVD.  Lymphatics:no cervical,supraclavicular adenopathy Resp: clear to auscultation bilaterally and normal percussion bilaterally Cardio: regular rate and rhythm. No gallop. GI: soft, nontender, not distended, no mass or organomegaly. Normally active bowel sounds. Musculoskeletal/ Extremities: without pitting edema, cords, tenderness Neuro:  no peripheral neuropathy. Otherwise nonfocal. PSYCH appropriate mood and affect Skin without rash, ecchymosis, petechiae   Lab Results:  Results for orders placed or performed in visit on 11/13/15  CBC with Differential  Result Value Ref Range   WBC 4.2 3.9 - 10.3 10e3/uL   NEUT# 2.6 1.5 - 6.5  10e3/uL   HGB 13.8 11.6 - 15.9 g/dL   HCT 41.3 34.8 - 46.6 %   Platelets 187 145 - 400 10e3/uL   MCV 93.1 79.5 - 101.0 fL   MCH 31.0 25.1 - 34.0 pg   MCHC 33.3 31.5 - 36.0 g/dL   RBC 4.43 3.70 - 5.45 10e6/uL   RDW 12.9 11.2 - 14.5 %   lymph# 0.9 0.9 - 3.3 10e3/uL   MONO# 0.5 0.1 - 0.9 10e3/uL   Eosinophils Absolute 0.2 0.0 - 0.5 10e3/uL   Basophils Absolute 0.0 0.0 - 0.1 10e3/uL   NEUT% 60.9 38.4 - 76.8 %   LYMPH% 20.9 14.0 - 49.7 %   MONO% 12.1 0.0 - 14.0 %   EOS% 5.7 0.0 - 7.0 %   BASO% 0.4 0.0 - 2.0 %  Comprehensive metabolic panel  Result Value Ref Range   Sodium 138 136 - 145 mEq/L   Potassium 4.0 3.5 - 5.1 mEq/L   Chloride 105 98 - 109 mEq/L   CO2 23 22 - 29 mEq/L   Glucose 97 70 - 140 mg/dl   BUN 18.0 7.0 - 26.0 mg/dL   Creatinine 0.9 0.6 - 1.1 mg/dL   Total Bilirubin 0.47 0.20 - 1.20 mg/dL   Alkaline Phosphatase 65 40 - 150 U/L   AST 13 5 - 34 U/L   ALT 27 0 - 55 U/L   Total Protein 6.6 6.4 - 8.3 g/dL   Albumin 3.7 3.5 - 5.0 g/dL   Calcium 9.5 8.4 - 10.4 mg/dL   Anion Gap 10 3 - 11 mEq/L   EGFR 76 (L) >90 ml/min/1.73 m2  Magnesium  Result Value Ref Range   Magnesium 2.2 1.5 - 2.5 mg/dl    Labs reviewed with patient at time of visit Studies/Results:  No results found.  Medications: I have reviewed the patient's current medications. Continue pepcid 20 mg OTC bid or can do script if needed. Discussed use of nicotine gum  DISCUSSION Interval history and meds as above. Smoking cessation. Patient is pleased with how well she has done with start of treatment and in agreement with continuing as planned.  Assessment/Plan:   1.vaginal cuff recurrence of endometrial carcinoma: original diagnosis 1A grade 1 endometrioid endometrial carcinoma, treated with robotic assisted hysterectomy BSO bilateral sentinel nodes 11-01-14, Vaginal cuff recurrence identified on follow up exam 08-2015, without other disease by CT 10-18-15.  IMRT to area of recurrence and associated  pelvic nodal areas with sensitizing CDDP underway, to be followed by vaginal brachytherapy. Cycle 2 CDDP today. I will see her in infusion area on 8-21, weekly CDDP thru 12-04-15.  2.long tobacco: patient and husband stopped smoking 2 weeks ago. Tobacco cessation counseling done again today. 3.HA beginning day after first chemo, doubt zofran related, resolved now. She will let us know if reoccurs 4.up to date mammograms 09-2015 5. Up to date colonoscopy 2014. 6.per patient, slightly low bone density: bone density scan by Dr Nori Riis, not in this EMR. 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 Chemo orders confirmed. Time spent 25  min including >50% counseling and coordination of care. CcDrs Kinard and Denman George, and Dr Nori Riis for update.  Evlyn Clines, MD   11/14/2015, 8:48 AM

## 2015-11-14 ENCOUNTER — Encounter: Payer: Self-pay | Admitting: Radiation Oncology

## 2015-11-14 ENCOUNTER — Ambulatory Visit
Admission: RE | Admit: 2015-11-14 | Discharge: 2015-11-14 | Disposition: A | Discharge status: Home / Self Care | Payer: BLUE CROSS/BLUE SHIELD | Source: Ambulatory Visit | Attending: Radiation Oncology | Admitting: Radiation Oncology

## 2015-11-14 VITALS — BP 105/59 | HR 65 | Temp 98.2°F | Resp 20 | Wt 154.8 lb

## 2015-11-14 DIAGNOSIS — R51 Headache: Secondary | ICD-10-CM | POA: Diagnosis not present

## 2015-11-14 DIAGNOSIS — Z510 Encounter for antineoplastic radiation therapy: Secondary | ICD-10-CM | POA: Diagnosis not present

## 2015-11-14 DIAGNOSIS — F17210 Nicotine dependence, cigarettes, uncomplicated: Secondary | ICD-10-CM | POA: Diagnosis not present

## 2015-11-14 DIAGNOSIS — D649 Anemia, unspecified: Secondary | ICD-10-CM | POA: Diagnosis not present

## 2015-11-14 DIAGNOSIS — C541 Malignant neoplasm of endometrium: Secondary | ICD-10-CM | POA: Diagnosis not present

## 2015-11-14 DIAGNOSIS — Z880 Allergy status to penicillin: Secondary | ICD-10-CM | POA: Diagnosis not present

## 2015-11-14 DIAGNOSIS — C7982 Secondary malignant neoplasm of genital organs: Secondary | ICD-10-CM | POA: Diagnosis not present

## 2015-11-14 DIAGNOSIS — R011 Cardiac murmur, unspecified: Secondary | ICD-10-CM | POA: Diagnosis not present

## 2015-11-14 NOTE — Progress Notes (Signed)
Weekly rad txs pelvis/cervical 9/25 compltd, no discharge, bleeding, regular bladder and bowel movements, no skin irritation, using cottenale moist wipes, asked for sitz bath , appetite good, no nausea, energy level good, naps ome aytimes,not sleeping that well at night, gets up to void  3-4x 10:09 AM BP (!) 105/59 (BP Location: Right Arm, Patient Position: Sitting, Cuff Size: Normal)   Pulse 65   Temp 98.2 F (36.8 C) (Oral)   Resp 20   Wt 154 lb 12.8 oz (70.2 kg)   LMP 08/01/2011   BMI 29.25 kg/m   Wt Readings from Last 3 Encounters:  11/14/15 154 lb 12.8 oz (70.2 kg)  11/13/15 154 lb 1.6 oz (69.9 kg)  11/07/15 159 lb (72.1 kg)    no pain

## 2015-11-14 NOTE — Progress Notes (Signed)
  Radiation Oncology         [phone] ________________________________  Name: Karina Briggs MRN: [id number]  Date: 11/14/2015  DOB: 1960-06-16  Weekly Radiation Therapy Management    ICD-9-CM ICD-10-CM   1. Endometrial cancer (HCC) 182.0 C54.1     Current Dose: 16.2 Gy     Planned Dose:  45 + Gy  Narrative . . . . . . . . The patient presents for routine under treatment assessment.                              Weekly rad txs pelvis/cervical 9/25 completed. She denies vaginal/rectal discharge/bleeding, nausea, or skin irritation. She has a regular bladder and bowel movements and using cottenale moist wipes. She asked for a sitz bath. She has a good appetite, energy level, and naps during the day when she's tired. She states she is not sleeping much as night due to nocturia x3-4. She states she's able to go back to sleep and attributes the nocturia to drinking more during the day, Since she is advised to force fluids with chemotherapy. She denies urinary urgency. She has headaches.                                 Set-up films were reviewed.                                 The chart was checked. Physical Findings. . .  weight is 154 lb 12.8 oz (70.2 kg). Her oral temperature is 98.2 F (36.8 C). Her blood pressure is 105/59 (abnormal) and her pulse is 65. Her respiration is 20.   Lungs are clear to auscultation bilaterally. Heart has regular rate and rhythm. Impression . . . . . . . The patient is tolerating radiation. Plan . . . . . . . . . . . . Continue treatment as planned. I advised her to speak with Dr. Marko Plume about her headaches and Zofran. ________________________________   Blair Promise, PhD, MD  This document serves as a record of services personally performed by Gery Pray, MD. It was created on his behalf by Darcus Austin, a trained medical scribe. The creation of this record is based on the scribe's personal observations and the provider's statements to them. This  document has been checked and approved by the attending provider.

## 2015-11-15 ENCOUNTER — Ambulatory Visit
Admission: RE | Admit: 2015-11-15 | Discharge: 2015-11-15 | Disposition: A | Discharge status: Home / Self Care | Payer: BLUE CROSS/BLUE SHIELD | Source: Ambulatory Visit | Attending: Radiation Oncology | Admitting: Radiation Oncology

## 2015-11-15 DIAGNOSIS — R011 Cardiac murmur, unspecified: Secondary | ICD-10-CM | POA: Diagnosis not present

## 2015-11-15 DIAGNOSIS — R51 Headache: Secondary | ICD-10-CM | POA: Diagnosis not present

## 2015-11-15 DIAGNOSIS — Z880 Allergy status to penicillin: Secondary | ICD-10-CM | POA: Diagnosis not present

## 2015-11-15 DIAGNOSIS — F17210 Nicotine dependence, cigarettes, uncomplicated: Secondary | ICD-10-CM | POA: Diagnosis not present

## 2015-11-15 DIAGNOSIS — Z510 Encounter for antineoplastic radiation therapy: Secondary | ICD-10-CM | POA: Diagnosis not present

## 2015-11-15 DIAGNOSIS — C541 Malignant neoplasm of endometrium: Secondary | ICD-10-CM | POA: Diagnosis not present

## 2015-11-15 DIAGNOSIS — C7982 Secondary malignant neoplasm of genital organs: Secondary | ICD-10-CM | POA: Diagnosis not present

## 2015-11-15 DIAGNOSIS — D649 Anemia, unspecified: Secondary | ICD-10-CM | POA: Diagnosis not present

## 2015-11-16 ENCOUNTER — Ambulatory Visit
Admission: RE | Admit: 2015-11-16 | Discharge: 2015-11-16 | Disposition: A | Discharge status: Home / Self Care | Payer: BLUE CROSS/BLUE SHIELD | Source: Ambulatory Visit | Attending: Radiation Oncology | Admitting: Radiation Oncology

## 2015-11-16 DIAGNOSIS — Z510 Encounter for antineoplastic radiation therapy: Secondary | ICD-10-CM | POA: Diagnosis not present

## 2015-11-16 DIAGNOSIS — R51 Headache: Secondary | ICD-10-CM | POA: Diagnosis not present

## 2015-11-16 DIAGNOSIS — C7982 Secondary malignant neoplasm of genital organs: Secondary | ICD-10-CM | POA: Diagnosis not present

## 2015-11-16 DIAGNOSIS — C541 Malignant neoplasm of endometrium: Secondary | ICD-10-CM | POA: Diagnosis not present

## 2015-11-16 DIAGNOSIS — F17210 Nicotine dependence, cigarettes, uncomplicated: Secondary | ICD-10-CM | POA: Diagnosis not present

## 2015-11-16 DIAGNOSIS — D649 Anemia, unspecified: Secondary | ICD-10-CM | POA: Diagnosis not present

## 2015-11-16 DIAGNOSIS — Z880 Allergy status to penicillin: Secondary | ICD-10-CM | POA: Diagnosis not present

## 2015-11-16 DIAGNOSIS — R011 Cardiac murmur, unspecified: Secondary | ICD-10-CM | POA: Diagnosis not present

## 2015-11-17 ENCOUNTER — Telehealth: Payer: Self-pay | Admitting: Oncology

## 2015-11-17 ENCOUNTER — Ambulatory Visit
Admission: RE | Admit: 2015-11-17 | Discharge: 2015-11-17 | Disposition: A | Discharge status: Home / Self Care | Payer: BLUE CROSS/BLUE SHIELD | Source: Ambulatory Visit | Attending: Radiation Oncology | Admitting: Radiation Oncology

## 2015-11-17 DIAGNOSIS — C541 Malignant neoplasm of endometrium: Secondary | ICD-10-CM | POA: Diagnosis not present

## 2015-11-17 DIAGNOSIS — R011 Cardiac murmur, unspecified: Secondary | ICD-10-CM | POA: Diagnosis not present

## 2015-11-17 DIAGNOSIS — R51 Headache: Secondary | ICD-10-CM | POA: Diagnosis not present

## 2015-11-17 DIAGNOSIS — F17210 Nicotine dependence, cigarettes, uncomplicated: Secondary | ICD-10-CM | POA: Diagnosis not present

## 2015-11-17 DIAGNOSIS — Z880 Allergy status to penicillin: Secondary | ICD-10-CM | POA: Diagnosis not present

## 2015-11-17 DIAGNOSIS — C7982 Secondary malignant neoplasm of genital organs: Secondary | ICD-10-CM | POA: Diagnosis not present

## 2015-11-17 DIAGNOSIS — D649 Anemia, unspecified: Secondary | ICD-10-CM | POA: Diagnosis not present

## 2015-11-17 DIAGNOSIS — Z510 Encounter for antineoplastic radiation therapy: Secondary | ICD-10-CM | POA: Diagnosis not present

## 2015-11-17 NOTE — Telephone Encounter (Signed)
Karina Briggs said she is still having some problems with constipation.  She said she is having small bowel movements but does not feel like she is completely "cleaned out."  She said she took Senakot and said it "almost killed her" because of stomach cramps.  She has been taking Miralax.  She is wondering if she can use a Fleets enema.  Advised her to try the Fleets liquid glycerin suppositories instead of the Fleets enema.  Also said she can try a mixture of 4 oz prune juice with 1 tsp butter heated in the microwave for 30-40 seconds.  Karina Briggs verbalized agreement and understanding.

## 2015-11-17 NOTE — Progress Notes (Signed)

## 2015-11-20 ENCOUNTER — Ambulatory Visit (HOSPITAL_BASED_OUTPATIENT_CLINIC_OR_DEPARTMENT_OTHER): Payer: BLUE CROSS/BLUE SHIELD

## 2015-11-20 ENCOUNTER — Ambulatory Visit
Admission: RE | Admit: 2015-11-20 | Discharge: 2015-11-20 | Disposition: A | Discharge status: Home / Self Care | Payer: BLUE CROSS/BLUE SHIELD | Source: Ambulatory Visit | Attending: Radiation Oncology | Admitting: Radiation Oncology

## 2015-11-20 ENCOUNTER — Other Ambulatory Visit: Payer: Self-pay | Admitting: Oncology

## 2015-11-20 ENCOUNTER — Other Ambulatory Visit (HOSPITAL_BASED_OUTPATIENT_CLINIC_OR_DEPARTMENT_OTHER): Payer: BLUE CROSS/BLUE SHIELD

## 2015-11-20 VITALS — BP 124/64 | HR 72 | Temp 98.3°F | Resp 18

## 2015-11-20 DIAGNOSIS — Z880 Allergy status to penicillin: Secondary | ICD-10-CM | POA: Diagnosis not present

## 2015-11-20 DIAGNOSIS — C7982 Secondary malignant neoplasm of genital organs: Secondary | ICD-10-CM

## 2015-11-20 DIAGNOSIS — R51 Headache: Secondary | ICD-10-CM | POA: Diagnosis not present

## 2015-11-20 DIAGNOSIS — Z5111 Encounter for antineoplastic chemotherapy: Secondary | ICD-10-CM

## 2015-11-20 DIAGNOSIS — C541 Malignant neoplasm of endometrium: Secondary | ICD-10-CM

## 2015-11-20 DIAGNOSIS — D649 Anemia, unspecified: Secondary | ICD-10-CM | POA: Diagnosis not present

## 2015-11-20 DIAGNOSIS — F17210 Nicotine dependence, cigarettes, uncomplicated: Secondary | ICD-10-CM | POA: Diagnosis not present

## 2015-11-20 DIAGNOSIS — R011 Cardiac murmur, unspecified: Secondary | ICD-10-CM | POA: Diagnosis not present

## 2015-11-20 DIAGNOSIS — Z510 Encounter for antineoplastic radiation therapy: Secondary | ICD-10-CM | POA: Diagnosis not present

## 2015-11-20 LAB — CBC WITH DIFFERENTIAL/PLATELET
BASO%: 0.2 % (ref 0.0–2.0)
BASOS ABS: 0 10*3/uL (ref 0.0–0.1)
EOS%: 4.6 % (ref 0.0–7.0)
Eosinophils Absolute: 0.2 10*3/uL (ref 0.0–0.5)
HEMATOCRIT: 37.9 % (ref 34.8–46.6)
HGB: 12.8 g/dL (ref 11.6–15.9)
LYMPH%: 11.3 % — AB (ref 14.0–49.7)
MCH: 31.2 pg (ref 25.1–34.0)
MCHC: 33.8 g/dL (ref 31.5–36.0)
MCV: 92.4 fL (ref 79.5–101.0)
MONO#: 0.5 10*3/uL (ref 0.1–0.9)
MONO%: 10 % (ref 0.0–14.0)
NEUT#: 3.5 10*3/uL (ref 1.5–6.5)
NEUT%: 73.9 % (ref 38.4–76.8)
PLATELETS: 167 10*3/uL (ref 145–400)
RBC: 4.1 10*6/uL (ref 3.70–5.45)
RDW: 12.5 % (ref 11.2–14.5)
WBC: 4.8 10*3/uL (ref 3.9–10.3)
lymph#: 0.5 10*3/uL — ABNORMAL LOW (ref 0.9–3.3)

## 2015-11-20 LAB — COMPREHENSIVE METABOLIC PANEL
ALBUMIN: 3.6 g/dL (ref 3.5–5.0)
ALK PHOS: 64 U/L (ref 40–150)
ALT: 20 U/L (ref 0–55)
AST: 14 U/L (ref 5–34)
Anion Gap: 8 mEq/L (ref 3–11)
BUN: 16.1 mg/dL (ref 7.0–26.0)
CALCIUM: 9.6 mg/dL (ref 8.4–10.4)
CHLORIDE: 104 meq/L (ref 98–109)
CO2: 28 mEq/L (ref 22–29)
CREATININE: 1 mg/dL (ref 0.6–1.1)
EGFR: 67 mL/min/{1.73_m2} — ABNORMAL LOW (ref >90)
GLUCOSE: 87 mg/dL (ref 70–140)
Potassium: 4.4 mEq/L (ref 3.5–5.1)
SODIUM: 140 meq/L (ref 136–145)
Total Bilirubin: 0.43 mg/dL (ref 0.20–1.20)
Total Protein: 6.6 g/dL (ref 6.4–8.3)

## 2015-11-20 LAB — MAGNESIUM: MAGNESIUM: 2 mg/dL (ref 1.5–2.5)

## 2015-11-20 MED ORDER — SODIUM CHLORIDE 0.9 % IV SOLN
Freq: Once | INTRAVENOUS | Status: AC
  Administered 2015-11-20: 12:00:00 via INTRAVENOUS
  Filled 2015-11-20: qty 4

## 2015-11-20 MED ORDER — SODIUM CHLORIDE 0.9 % IV SOLN
40.0000 mg/m2 | Freq: Once | INTRAVENOUS | Status: AC
  Administered 2015-11-20: 69 mg via INTRAVENOUS
  Filled 2015-11-20: qty 69

## 2015-11-20 MED ORDER — SODIUM CHLORIDE 0.9 % IV SOLN
Freq: Once | INTRAVENOUS | Status: AC
  Administered 2015-11-20: 12:00:00 via INTRAVENOUS
  Filled 2015-11-20: qty 5

## 2015-11-20 MED ORDER — POTASSIUM CHLORIDE 2 MEQ/ML IV SOLN
Freq: Once | INTRAVENOUS | Status: AC
  Administered 2015-11-20: 10:00:00 via INTRAVENOUS
  Filled 2015-11-20: qty 10

## 2015-11-20 MED ORDER — SODIUM CHLORIDE 0.9 % IV SOLN
Freq: Once | INTRAVENOUS | Status: AC
  Administered 2015-11-20: 10:00:00 via INTRAVENOUS

## 2015-11-20 NOTE — Progress Notes (Signed)
Reports watery stool x 1 yesterday after 2 loose stools . Took 2 imodium and non since. BM normal on Friday and sat. Eating and drinking .  IV site without pain or swelling , pt states symptom resolved within 20 minutes.

## 2015-11-20 NOTE — Patient Instructions (Signed)
[facility] Discharge Instructions for Patients Receiving Chemotherapy  Today you received the following chemotherapy agents CISPLATIN To help prevent nausea and vomiting after your treatment, we encourage you to take your nausea medication as prescribed. If you develop nausea and vomiting that is not controlled by your nausea medication, call the clinic.   BELOW ARE SYMPTOMS THAT SHOULD BE REPORTED IMMEDIATELY:  *FEVER GREATER THAN 100.5 F  *CHILLS WITH OR WITHOUT FEVER  NAUSEA AND VOMITING THAT IS NOT CONTROLLED WITH YOUR NAUSEA MEDICATION  *UNUSUAL SHORTNESS OF BREATH  *UNUSUAL BRUISING OR BLEEDING  TENDERNESS IN MOUTH AND THROAT WITH OR WITHOUT PRESENCE OF ULCERS  *URINARY PROBLEMS  *BOWEL PROBLEMS  UNUSUAL RASH Items with * indicate a potential emergency and should be followed up as soon as possible.  Feel free to call the clinic you have any questions or concerns. The clinic phone number is [phone].  Please show the [place] at check-in to the Emergency Department and triage nurse.

## 2015-11-20 NOTE — Progress Notes (Signed)
1240- This RN covering for Abelina Bachelor RN for lunch; Patient c/o burning and pain in anterior upper arm near right AC right when CDDP was due to be hung.  Emend had just finished infusing.  Fluids paused and blood return checked and noted at right wrist PIV site; blood return witnessed by [facility].  Was able to flush IV site multiple times; patient did not complain of pain at IV site; no swelling or redness noted.  After 3rd flush of PIV, patient states pain has subsided.  Warm compress given to patient for upper arm burning, as this could be from Emend. Abelina Bachelor RN returned from lunch, report given and education given to patient to alert RN if any pain or burning returns. Patient voices understanding of teaching.

## 2015-11-21 ENCOUNTER — Ambulatory Visit
Admission: RE | Admit: 2015-11-21 | Discharge: 2015-11-21 | Disposition: A | Discharge status: Home / Self Care | Payer: BLUE CROSS/BLUE SHIELD | Source: Ambulatory Visit | Attending: Radiation Oncology | Admitting: Radiation Oncology

## 2015-11-21 ENCOUNTER — Encounter: Payer: Self-pay | Admitting: Radiation Oncology

## 2015-11-21 VITALS — BP 124/58 | HR 65 | Resp 16 | Wt 155.9 lb

## 2015-11-21 DIAGNOSIS — D649 Anemia, unspecified: Secondary | ICD-10-CM | POA: Diagnosis not present

## 2015-11-21 DIAGNOSIS — Z510 Encounter for antineoplastic radiation therapy: Secondary | ICD-10-CM | POA: Diagnosis not present

## 2015-11-21 DIAGNOSIS — F17210 Nicotine dependence, cigarettes, uncomplicated: Secondary | ICD-10-CM | POA: Diagnosis not present

## 2015-11-21 DIAGNOSIS — C541 Malignant neoplasm of endometrium: Secondary | ICD-10-CM

## 2015-11-21 DIAGNOSIS — R011 Cardiac murmur, unspecified: Secondary | ICD-10-CM | POA: Diagnosis not present

## 2015-11-21 DIAGNOSIS — Z880 Allergy status to penicillin: Secondary | ICD-10-CM | POA: Diagnosis not present

## 2015-11-21 DIAGNOSIS — C7982 Secondary malignant neoplasm of genital organs: Secondary | ICD-10-CM | POA: Diagnosis not present

## 2015-11-21 DIAGNOSIS — R51 Headache: Secondary | ICD-10-CM | POA: Diagnosis not present

## 2015-11-21 NOTE — Progress Notes (Addendum)
Weight and vitals stable. Denies pain. Reports mild fatigue. Denies dysuria or hematuria. Denies vaginal odor itching or discharge. Denies any vaginal bleeding. Reports one episode of diarrhea on Sunday but, no bowel movements since.   BP (!) 124/58   Pulse 65   Resp 16   Wt 155 lb 14.4 oz (70.7 kg)   LMP 08/01/2011   SpO2 100%   BMI 29.46 kg/m  Wt Readings from Last 3 Encounters:  11/21/15 155 lb 14.4 oz (70.7 kg)  11/14/15 154 lb 12.8 oz (70.2 kg)  11/13/15 154 lb 1.6 oz (69.9 kg)

## 2015-11-21 NOTE — Progress Notes (Signed)
  Radiation Oncology         [phone] ________________________________  Name: Karina Briggs MRN: [id number]  Date: 11/21/2015  DOB: 04/24/1960  Weekly Radiation Therapy Management    ICD-9-CM ICD-10-CM   1. 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 Endometrial cancer (HCC) 182.0 C54.1     Current Dose: 25.2 Gy     Planned Dose:  45 + Gy  Narrative . . . . . . . . The patient presents for routine under treatment assessment.                              Weight and vitals stable. Denies pain, dysuria, hematuria, vaginal odor, vaginal itching, or vaginal discharg. Reports mild fatigue and one episode of diarrhea on Sunday, but no bowel movements since.                                 Set-up films were reviewed.                                 The chart was checked. Physical Findings. . .  weight is 155 lb 14.4 oz (70.7 kg). Her blood pressure is 124/58 (abnormal) and her pulse is 65. Her respiration is 16 and oxygen saturation is 100%.   Impression . . . . . . . The patient is tolerating radiation. Plan . . . . . . . . . . . . Continue treatment as planned. Today we talked about options concerning her brachytherapy treatment including the vaginal cylinder, Capri applicator and interstitial implant [facility]) ________________________________   Blair Promise, PhD, MD  This document serves as a record of services personally performed by Gery Pray, MD. It was created on his behalf by Darcus Austin, a trained medical scribe. The creation of this record is based on the scribe's personal observations and the provider's statements to them. This document has been checked and approved by the attending provider.

## 2015-11-21 NOTE — Progress Notes (Signed)
Constipation Management   Constipation is being unable to move your bowels, having less frequent bowel movements, or having to push harder to move your bowels. In addition, certain medications (i.e. pain medications), eating or drinking less and being less active are possible causes. Keeping your bowel movements regular and easy to pass is important.  A daily bowel regimen helps to prevent this potentially troublesome side effect.    What can you do about it?  Diet:  Most adults should consume a diet of 25-35 grams of fiber per day.   Foods high in fiber include:  wheat bran, whole-grain breads/cereals, fruits and vegetables (raw and uncooked with skins and peels on), popcorn and dried beans.   *If you have no appetite or problems chewing or swallowing, have ever been told that you need a low-fiber diet, low-residue diet, these food may not be recommended.    Fluid: It is important for you to stay hydrated.  You can accomplish this by drinking eight, 8oz glasses of fluids a day.  Examples of fluids are water, prune juice, popsicles, gelatin or ice cream.   *The benefits of diet and fluid intake may not be noted for several weeks, so it is important to not discontinue if you don't see immediate results.    Medications (if constipated):  Marland Kitchen MIRALAX-17g powder (one capful) diluted in 8 fluid ounces water, juice, soda or coffee orally once a day and take once one (1) Senokot-Sr- at bedtime.  . If you do not have a bowel movement in the morning, take MIRALAX-17 g powder (one capful) diluted in 8 fluid ounces water, juice, soda or coffee orally once a day and take one (1) Senokot-Sr in the morning and bedtime.   . If you do not have bowel movement by morning then take one (1) Liquid Glycerin suppository.   *IF AFTER THIS PROTOCOL YOU STILL HAVEN'T HAD A BOWEL MOVEMENT, PLEASE CALL RADIATION NURSING AT [phone].   Once you started having bowel movements, continue to take one (1) Senokot-Sr  once a day as your daily laxative protocol.  If you start having diarrhea, take only one (1) Senokot-Sr every other day.r

## 2015-11-22 ENCOUNTER — Ambulatory Visit
Admission: RE | Admit: 2015-11-22 | Discharge: 2015-11-22 | Disposition: A | Discharge status: Home / Self Care | Payer: BLUE CROSS/BLUE SHIELD | Source: Ambulatory Visit | Attending: Radiation Oncology | Admitting: Radiation Oncology

## 2015-11-22 DIAGNOSIS — R51 Headache: Secondary | ICD-10-CM | POA: Diagnosis not present

## 2015-11-22 DIAGNOSIS — C541 Malignant neoplasm of endometrium: Secondary | ICD-10-CM | POA: Diagnosis not present

## 2015-11-22 DIAGNOSIS — Z880 Allergy status to penicillin: Secondary | ICD-10-CM | POA: Diagnosis not present

## 2015-11-22 DIAGNOSIS — D649 Anemia, unspecified: Secondary | ICD-10-CM | POA: Diagnosis not present

## 2015-11-22 DIAGNOSIS — F17210 Nicotine dependence, cigarettes, uncomplicated: Secondary | ICD-10-CM | POA: Diagnosis not present

## 2015-11-22 DIAGNOSIS — R011 Cardiac murmur, unspecified: Secondary | ICD-10-CM | POA: Diagnosis not present

## 2015-11-22 DIAGNOSIS — C7982 Secondary malignant neoplasm of genital organs: Secondary | ICD-10-CM | POA: Diagnosis not present

## 2015-11-22 DIAGNOSIS — Z510 Encounter for antineoplastic radiation therapy: Secondary | ICD-10-CM | POA: Diagnosis not present

## 2015-11-23 ENCOUNTER — Ambulatory Visit
Admission: RE | Admit: 2015-11-23 | Discharge: 2015-11-23 | Disposition: A | Discharge status: Home / Self Care | Payer: BLUE CROSS/BLUE SHIELD | Source: Ambulatory Visit | Attending: Radiation Oncology | Admitting: Radiation Oncology

## 2015-11-23 DIAGNOSIS — R011 Cardiac murmur, unspecified: Secondary | ICD-10-CM | POA: Diagnosis not present

## 2015-11-23 DIAGNOSIS — D649 Anemia, unspecified: Secondary | ICD-10-CM | POA: Diagnosis not present

## 2015-11-23 DIAGNOSIS — C541 Malignant neoplasm of endometrium: Secondary | ICD-10-CM | POA: Diagnosis not present

## 2015-11-23 DIAGNOSIS — R51 Headache: Secondary | ICD-10-CM | POA: Diagnosis not present

## 2015-11-23 DIAGNOSIS — Z880 Allergy status to penicillin: Secondary | ICD-10-CM | POA: Diagnosis not present

## 2015-11-23 DIAGNOSIS — C7982 Secondary malignant neoplasm of genital organs: Secondary | ICD-10-CM | POA: Diagnosis not present

## 2015-11-23 DIAGNOSIS — F17210 Nicotine dependence, cigarettes, uncomplicated: Secondary | ICD-10-CM | POA: Diagnosis not present

## 2015-11-23 DIAGNOSIS — Z510 Encounter for antineoplastic radiation therapy: Secondary | ICD-10-CM | POA: Diagnosis not present

## 2015-11-24 ENCOUNTER — Ambulatory Visit
Admission: RE | Admit: 2015-11-24 | Discharge: 2015-11-24 | Disposition: A | Discharge status: Home / Self Care | Payer: BLUE CROSS/BLUE SHIELD | Source: Ambulatory Visit | Attending: Radiation Oncology | Admitting: Radiation Oncology

## 2015-11-24 DIAGNOSIS — C541 Malignant neoplasm of endometrium: Secondary | ICD-10-CM | POA: Diagnosis not present

## 2015-11-24 DIAGNOSIS — C7982 Secondary malignant neoplasm of genital organs: Secondary | ICD-10-CM | POA: Diagnosis not present

## 2015-11-24 DIAGNOSIS — D649 Anemia, unspecified: Secondary | ICD-10-CM | POA: Diagnosis not present

## 2015-11-24 DIAGNOSIS — Z510 Encounter for antineoplastic radiation therapy: Secondary | ICD-10-CM | POA: Diagnosis not present

## 2015-11-24 DIAGNOSIS — Z880 Allergy status to penicillin: Secondary | ICD-10-CM | POA: Diagnosis not present

## 2015-11-24 DIAGNOSIS — F17210 Nicotine dependence, cigarettes, uncomplicated: Secondary | ICD-10-CM | POA: Diagnosis not present

## 2015-11-24 DIAGNOSIS — R51 Headache: Secondary | ICD-10-CM | POA: Diagnosis not present

## 2015-11-24 DIAGNOSIS — R011 Cardiac murmur, unspecified: Secondary | ICD-10-CM | POA: Diagnosis not present

## 2015-11-26 ENCOUNTER — Other Ambulatory Visit: Payer: Self-pay | Admitting: Oncology

## 2015-11-27 ENCOUNTER — Encounter: Payer: Self-pay | Admitting: Oncology

## 2015-11-27 ENCOUNTER — Ambulatory Visit (HOSPITAL_BASED_OUTPATIENT_CLINIC_OR_DEPARTMENT_OTHER): Payer: BLUE CROSS/BLUE SHIELD | Admitting: Oncology

## 2015-11-27 ENCOUNTER — Ambulatory Visit (HOSPITAL_BASED_OUTPATIENT_CLINIC_OR_DEPARTMENT_OTHER): Payer: BLUE CROSS/BLUE SHIELD

## 2015-11-27 ENCOUNTER — Other Ambulatory Visit (HOSPITAL_BASED_OUTPATIENT_CLINIC_OR_DEPARTMENT_OTHER): Payer: BLUE CROSS/BLUE SHIELD

## 2015-11-27 ENCOUNTER — Telehealth: Payer: Self-pay

## 2015-11-27 ENCOUNTER — Ambulatory Visit
Admission: RE | Admit: 2015-11-27 | Discharge: 2015-11-27 | Disposition: A | Discharge status: Home / Self Care | Payer: BLUE CROSS/BLUE SHIELD | Source: Ambulatory Visit | Attending: Radiation Oncology | Admitting: Radiation Oncology

## 2015-11-27 VITALS — BP 103/57 | HR 68 | Temp 98.1°F | Resp 18 | Wt 155.0 lb

## 2015-11-27 VITALS — BP 103/57 | HR 68 | Temp 98.1°F | Resp 18

## 2015-11-27 DIAGNOSIS — C7982 Secondary malignant neoplasm of genital organs: Secondary | ICD-10-CM

## 2015-11-27 DIAGNOSIS — C541 Malignant neoplasm of endometrium: Secondary | ICD-10-CM

## 2015-11-27 DIAGNOSIS — Z880 Allergy status to penicillin: Secondary | ICD-10-CM | POA: Diagnosis not present

## 2015-11-27 DIAGNOSIS — K219 Gastro-esophageal reflux disease without esophagitis: Secondary | ICD-10-CM

## 2015-11-27 DIAGNOSIS — R51 Headache: Secondary | ICD-10-CM | POA: Diagnosis not present

## 2015-11-27 DIAGNOSIS — Z5111 Encounter for antineoplastic chemotherapy: Secondary | ICD-10-CM | POA: Diagnosis not present

## 2015-11-27 DIAGNOSIS — Z510 Encounter for antineoplastic radiation therapy: Secondary | ICD-10-CM | POA: Diagnosis not present

## 2015-11-27 DIAGNOSIS — F17210 Nicotine dependence, cigarettes, uncomplicated: Secondary | ICD-10-CM | POA: Diagnosis not present

## 2015-11-27 DIAGNOSIS — R011 Cardiac murmur, unspecified: Secondary | ICD-10-CM | POA: Diagnosis not present

## 2015-11-27 DIAGNOSIS — D649 Anemia, unspecified: Secondary | ICD-10-CM | POA: Diagnosis not present

## 2015-11-27 LAB — CBC WITH DIFFERENTIAL/PLATELET
BASO%: 0.3 % (ref 0.0–2.0)
Basophils Absolute: 0 10*3/uL (ref 0.0–0.1)
EOS%: 1.3 % (ref 0.0–7.0)
Eosinophils Absolute: 0 10*3/uL (ref 0.0–0.5)
HEMATOCRIT: 34.9 % (ref 34.8–46.6)
HGB: 12 g/dL (ref 11.6–15.9)
LYMPH#: 0.5 10*3/uL — AB (ref 0.9–3.3)
LYMPH%: 15.2 % (ref 14.0–49.7)
MCH: 31.6 pg (ref 25.1–34.0)
MCHC: 34.4 g/dL (ref 31.5–36.0)
MCV: 91.8 fL (ref 79.5–101.0)
MONO#: 0.4 10*3/uL (ref 0.1–0.9)
MONO%: 12.6 % (ref 0.0–14.0)
NEUT%: 70.6 % (ref 38.4–76.8)
NEUTROS ABS: 2.2 10*3/uL (ref 1.5–6.5)
PLATELETS: 200 10*3/uL (ref 145–400)
RBC: 3.8 10*6/uL (ref 3.70–5.45)
RDW: 12.7 % (ref 11.2–14.5)
WBC: 3.1 10*3/uL — AB (ref 3.9–10.3)

## 2015-11-27 LAB — COMPREHENSIVE METABOLIC PANEL
ALBUMIN: 3.3 g/dL — AB (ref 3.5–5.0)
ALK PHOS: 64 U/L (ref 40–150)
ALT: 15 U/L (ref 0–55)
ANION GAP: 9 meq/L (ref 3–11)
AST: 11 U/L (ref 5–34)
BILIRUBIN TOTAL: 0.31 mg/dL (ref 0.20–1.20)
BUN: 15.5 mg/dL (ref 7.0–26.0)
CALCIUM: 9.3 mg/dL (ref 8.4–10.4)
CO2: 24 mEq/L (ref 22–29)
CREATININE: 0.8 mg/dL (ref 0.6–1.1)
Chloride: 107 mEq/L (ref 98–109)
EGFR: 78 mL/min/{1.73_m2} — ABNORMAL LOW (ref >90)
Glucose: 102 mg/dl (ref 70–140)
Potassium: 3.8 mEq/L (ref 3.5–5.1)
Sodium: 140 mEq/L (ref 136–145)
TOTAL PROTEIN: 6.2 g/dL — AB (ref 6.4–8.3)

## 2015-11-27 LAB — MAGNESIUM: MAGNESIUM: 2.2 mg/dL (ref 1.5–2.5)

## 2015-11-27 MED ORDER — SODIUM CHLORIDE 0.9 % IV SOLN
Freq: Once | INTRAVENOUS | Status: AC
  Administered 2015-11-27: 10:00:00 via INTRAVENOUS

## 2015-11-27 MED ORDER — SODIUM CHLORIDE 0.9 % IV SOLN
Freq: Once | INTRAVENOUS | Status: AC
  Administered 2015-11-27: 13:00:00 via INTRAVENOUS
  Filled 2015-11-27: qty 5

## 2015-11-27 MED ORDER — PANTOPRAZOLE SODIUM 40 MG PO TBEC: 40.0000 mg | DELAYED_RELEASE_TABLET | Freq: Every day | ORAL | 2 refills | Status: DC

## 2015-11-27 MED ORDER — POTASSIUM CHLORIDE 2 MEQ/ML IV SOLN
Freq: Once | INTRAVENOUS | Status: AC
  Administered 2015-11-27: 11:00:00 via INTRAVENOUS
  Filled 2015-11-27: qty 10

## 2015-11-27 MED ORDER — SODIUM CHLORIDE 0.9 % IV SOLN
Freq: Once | INTRAVENOUS | Status: AC
  Administered 2015-11-27: 12:00:00 via INTRAVENOUS
  Filled 2015-11-27: qty 4

## 2015-11-27 MED ORDER — SODIUM CHLORIDE 0.9 % IV SOLN
40.0000 mg/m2 | Freq: Once | INTRAVENOUS | Status: AC
  Administered 2015-11-27: 69 mg via INTRAVENOUS
  Filled 2015-11-27: qty 69

## 2015-11-27 NOTE — Patient Instructions (Signed)
[facility] Discharge Instructions for Patients Receiving Chemotherapy  Today you received the following chemotherapy agents: Cisplatin   To help prevent nausea and vomiting after your treatment, we encourage you to take your nausea medication as directed.    If you develop nausea and vomiting that is not controlled by your nausea medication, call the clinic.   BELOW ARE SYMPTOMS THAT SHOULD BE REPORTED IMMEDIATELY:  *FEVER GREATER THAN 100.5 F  *CHILLS WITH OR WITHOUT FEVER  NAUSEA AND VOMITING THAT IS NOT CONTROLLED WITH YOUR NAUSEA MEDICATION  *UNUSUAL SHORTNESS OF BREATH  *UNUSUAL BRUISING OR BLEEDING  TENDERNESS IN MOUTH AND THROAT WITH OR WITHOUT PRESENCE OF ULCERS  *URINARY PROBLEMS  *BOWEL PROBLEMS  UNUSUAL RASH Items with * indicate a potential emergency and should be followed up as soon as possible.  Feel free to call the clinic you have any questions or concerns. The clinic phone number is [phone].  Please show the CHEMO ALERT CARD at check-in to the Emergency Department and triage nurse.   

## 2015-11-27 NOTE — Telephone Encounter (Signed)
Let pt know protonix e-script was sent to [facility] pharmacy

## 2015-11-27 NOTE — Progress Notes (Signed)
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 IMRT is planned thru 12-06-15 with vaginal brachytherapy to follow. She is receiving #4 sensitizing CDDP today and will have #5 CDDP on 12-04-15.  Saw Dr Denman George 10-06-15, will have follow up after completion of treatment.  Patient is tolerating treatment generally well overall. Peripheral IV access has not been difficult, tho some burning with EMEND last 2 treaments so will slow that administration today. GERD is bothersome despite bid OTC pepcid and some mylanta, so will begin Protonix 40 mg daily. She has had no significant nausea. Stools are moving more easily, no diarrhea. She had slight HA a few times in past week, including out 2+ days from zofran, not improved with tylenol, has been off caffeine since prior to start of treatment, no allergic sinus symptoms. She has soreness in right SI area, possibly from positioning from radiation, no radiation of the pain. No bleeding. No SOB, extreme fatigue, abdominal or pelvic pain, bladder symptoms, LE swelling, fever or symptoms of infection. No peripheral neuropathy. Sleeping well. PO intake ok now per nutritionist. 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 Path findings additionally were of 25% myometrial invasion (5/28 mm), negative sentinel nodes and no LVSI. She did well until postcoital spotting March 2017. She had follow up with Dr Nori Riis in 08-2015, with PAPs x 2 with endometrial cells and  nodule found at right vaginal cuff. She saw Dr Denman George 10-06-15, with 1.5 cm nodular lesion at right vaginal fornix, mobile and free from rectal wall but infiltrating ~ 1 cm deep to vaginal mucosa. PET was not approved by insurance; CT CAP 10-18-15 had rounded soft tissue at right vaginal cuff 2.3 x 2.0 cm, with no other evidence of malignancy. Recommendation for external beam radiation with sensitizing CDDP, + vaginal brachytherapy. She began IMRT on 11-02-15 and sensitizing CDDP on 11-06-15.   Objective:  Vital signs in last 24 hours:  BP (!) 103/57 (BP Location: Left Arm, Patient Position: Sitting)   Pulse 68   Temp 98.1 F (36.7 C) (Oral)   Resp 18   LMP 08/01/2011   SpO2 100%   Alert, oriented and appropriate. Ambulatory without difficulty into office, appears comfortable in recliner now, changes positions easily  No alopecia  HEENT:PERRL, sclerae not icteric. Oral mucosa moist without lesions Lymphatics:no  Supraclavicular adenopathy Resp: clear to auscultation bilaterally  Cardio: regular rate and rhythm. No gallop. GI: soft, nontender, not distended, no mass or organomegaly. Normally active bowel sounds.  Musculoskeletal/ Extremities:LE  without pitting edema, cords, tenderness. Area of discomfort is at right SI, no rash or erythema Neuro: no peripheral neuropathy. Otherwise nonfocal. PSYCH appropriate mood and affect Skin without rash, ecchymosis, petechiae   Lab Results:  Results for orders placed or performed in visit on 11/27/15  Comprehensive metabolic panel  Result Value Ref Range   Sodium 140 136 - 145 mEq/L   Potassium 3.8 3.5 - 5.1 mEq/L   Chloride 107 98 - 109 mEq/L   CO2 24 22 -  29 mEq/L   Glucose 102 70 - 140 mg/dl   BUN 15.5 7.0 - 26.0 mg/dL   Creatinine 0.8 0.6 - 1.1 mg/dL   Total Bilirubin 0.31 0.20 - 1.20 mg/dL   Alkaline Phosphatase 64 40 - 150 U/L   AST 11 5 - 34 U/L   ALT 15 0 - 55 U/L   Total Protein 6.2 (L) 6.4 - 8.3 g/dL   Albumin 3.3 (L) 3.5 - 5.0  g/dL   Calcium 9.3 8.4 - 10.4 mg/dL   Anion Gap 9 3 - 11 mEq/L   EGFR 78 (L) >90 ml/min/1.73 m2  Magnesium  Result Value Ref Range   Magnesium 2.2 1.5 - 2.5 mg/dl  CBC with Differential  Result Value Ref Range   WBC 3.1 (L) 3.9 - 10.3 10e3/uL   NEUT# 2.2 1.5 - 6.5 10e3/uL   HGB 12.0 11.6 - 15.9 g/dL   HCT 34.9 34.8 - 46.6 %   Platelets 200 145 - 400 10e3/uL   MCV 91.8 79.5 - 101.0 fL   MCH 31.6 25.1 - 34.0 pg   MCHC 34.4 31.5 - 36.0 g/dL   RBC 3.80 3.70 - 5.45 10e6/uL   RDW 12.7 11.2 - 14.5 %   lymph# 0.5 (L) 0.9 - 3.3 10e3/uL   MONO# 0.4 0.1 - 0.9 10e3/uL   Eosinophils Absolute 0.0 0.0 - 0.5 10e3/uL   Basophils Absolute 0.0 0.0 - 0.1 10e3/uL   NEUT% 70.6 38.4 - 76.8 %   LYMPH% 15.2 14.0 - 49.7 %   MONO% 12.6 0.0 - 14.0 %   EOS% 1.3 0.0 - 7.0 %   BASO% 0.3 0.0 - 2.0 %   Labs reviewed with patient  Studies/Results:  No results found.  Medications: I have reviewed the patient's current medications. Will begin protonix. Slow time for peripheral infusion of EMEND.  DISCUSSION Overall tolerating treatment well and is in agreement with continuing. Protonix.  Hold mylanta if diarrhea. Tylenol ok, would not use NSAID until GERD better. Ice or heat to SI area, watch positioning. Headaches may be multifactorial, not clearly zofran related as these have happened out a couple of days from any zofran use. Not severe. Prn tylenol and follow     Assessment/Plan:  1.vaginal cuff recurrence of endometrial carcinoma: original diagnosis 1A grade 1 endometrioid endometrial carcinoma, treated with robotic assisted hysterectomy BSO bilateral sentinel nodes 11-01-14, Vaginal cuff recurrence identified on follow up exam 08-2015, without other disease by CT 10-18-15.  IMRT to area of recurrence and associated pelvic nodal areas with sensitizing CDDP underway, to be followed by vaginal brachytherapy. Cycle 4 CDDP today, last planned cycle 5 on 8-28 with IMRT to complete 12-06-15.  2.long tobacco:  patient and husband stopped smoking 2 weeks ago. Tobacco cessation counseling done  3.HA intermittently since start of treatment, see above 4.up to date mammograms 09-2015 5. Up to date colonoscopy 2014. 6.per patient, slightly low bone density: bone density scan by Dr Nori Riis, not in this EMR. 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 Discussed with infusion RN. Chemo orders confirmed. Does not need MD 8-24.  Time spent 25 min including >50% counseling and coordination of care. Cc Drs Denman George and Nori Riis for update   Evlyn Clines, MD   11/27/2015, 9:55 PM

## 2015-11-27 NOTE — Telephone Encounter (Signed)
-----   Message from Gordy Levan, MD sent at 11/27/2015 10:42 AM EDT ----- Protonix 40 mg daily #30  2 RF  thx

## 2015-11-28 ENCOUNTER — Encounter: Payer: Self-pay | Admitting: Radiation Oncology

## 2015-11-28 ENCOUNTER — Ambulatory Visit
Admission: RE | Admit: 2015-11-28 | Discharge: 2015-11-28 | Disposition: A | Discharge status: Home / Self Care | Payer: BLUE CROSS/BLUE SHIELD | Source: Ambulatory Visit | Attending: Radiation Oncology | Admitting: Radiation Oncology

## 2015-11-28 ENCOUNTER — Encounter: Payer: Self-pay | Admitting: Oncology

## 2015-11-28 VITALS — BP 105/59 | HR 69 | Temp 98.2°F | Ht 61.0 in | Wt 154.7 lb

## 2015-11-28 DIAGNOSIS — R51 Headache: Secondary | ICD-10-CM | POA: Diagnosis not present

## 2015-11-28 DIAGNOSIS — K219 Gastro-esophageal reflux disease without esophagitis: Secondary | ICD-10-CM | POA: Insufficient documentation

## 2015-11-28 DIAGNOSIS — C7982 Secondary malignant neoplasm of genital organs: Secondary | ICD-10-CM

## 2015-11-28 DIAGNOSIS — C541 Malignant neoplasm of endometrium: Secondary | ICD-10-CM

## 2015-11-28 DIAGNOSIS — R011 Cardiac murmur, unspecified: Secondary | ICD-10-CM | POA: Diagnosis not present

## 2015-11-28 DIAGNOSIS — Z880 Allergy status to penicillin: Secondary | ICD-10-CM | POA: Diagnosis not present

## 2015-11-28 DIAGNOSIS — D649 Anemia, unspecified: Secondary | ICD-10-CM | POA: Diagnosis not present

## 2015-11-28 DIAGNOSIS — F17210 Nicotine dependence, cigarettes, uncomplicated: Secondary | ICD-10-CM | POA: Diagnosis not present

## 2015-11-28 DIAGNOSIS — Z510 Encounter for antineoplastic radiation therapy: Secondary | ICD-10-CM | POA: Diagnosis not present

## 2015-11-28 NOTE — Progress Notes (Addendum)
Janvi has completed 19 fractions to her pelvis.  She denies having pain, bladder issues, skin irritation, nausea or vaginal/rectal bleeding.  She reports having fatigue.  She reports her bowel movements are normal this week.  She had chemotherapy yesterday.  BP (!) 105/59 (BP Location: Right Arm, Patient Position: Sitting)   Pulse 69   Temp 98.2 F (36.8 C) (Oral)   Ht 5\' 1"  (1.549 m)   Wt 154 lb 11.2 oz (70.2 kg)   LMP 08/01/2011   SpO2 100%   BMI 29.23 kg/m    Wt Readings from Last 3 Encounters:  11/28/15 154 lb 11.2 oz (70.2 kg)  11/27/15 155 lb (70.3 kg)  11/21/15 155 lb 14.4 oz (70.7 kg)

## 2015-11-28 NOTE — Progress Notes (Signed)
Medical Oncology  Message to scheduling sent as follows:  Cancel LL 8-24 LL + lab 9-5  L.Marko Plume MD

## 2015-11-28 NOTE — Progress Notes (Signed)
  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 Endometrial cancer, grade I (HCC) 182.0 C54.1     Current Dose: 34.2 Gy     Planned Dose:  45 + Gy  Narrative . . . . . . . . The patient presents for routine under treatment assessment.                              Weight and vitals stable. Elyannah has completed 19 fractions to her pelvis.  She denies having pain, bladder issues, skin irritation, nausea or vaginal/rectal bleeding.  She reports having fatigue.  She reports her bowel movements are normal this week.  She had chemotherapy yesterday. The patient reports that she had a good weekend due to "feeling great." She reports that her fatigue has started to lessened up, although the patient did have some heartburn over the weekend.  The patient reports that she is taking protonix for her heartburn.                                  Set-up films were reviewed.                                 The chart was checked. Physical Findings. . .  height is 5\' 1"  (1.549 m) and weight is 154 lb 11.2 oz (70.2 kg). Her oral temperature is 98.2 F (36.8 C). Her blood pressure is 105/59 (abnormal) and her pulse is 69. Her oxygen saturation is 100%.   Lungs are clear to auscultation bilaterally. Heart has regular rate and rhythm. No palpable cervical, supraclavicular, or axillary adenopathy. Abdomen soft, non-tender, normal bowel sounds. Some wheezing was noted, but after I instructed the patient to cough, the lungs were clear.   Impression . . . . . . . The patient is tolerating radiation. Plan . . . . . . . . . . . . Continue treatment as planned. I told the patient that next week I will perform a vaginal exam during her under treat appointment. After this, I might consider an  additional radiographic scan depending on the results of the exam.  This will be done to help plan her brachytherapy boost. ________________________________   Blair Promise, PhD, MD  This document serves as a record of services personally performed by Gery Pray, MD. It was created on his behalf by Truddie Hidden, a trained medical scribe. The creation of this record is based on the scribe's personal observations and the provider's statements to them. This document has been checked and approved by the attending provider.

## 2015-11-29 ENCOUNTER — Ambulatory Visit
Admission: RE | Admit: 2015-11-29 | Discharge: 2015-11-29 | Disposition: A | Discharge status: Home / Self Care | Payer: BLUE CROSS/BLUE SHIELD | Source: Ambulatory Visit | Attending: Radiation Oncology | Admitting: Radiation Oncology

## 2015-11-29 DIAGNOSIS — R51 Headache: Secondary | ICD-10-CM | POA: Diagnosis not present

## 2015-11-29 DIAGNOSIS — F17210 Nicotine dependence, cigarettes, uncomplicated: Secondary | ICD-10-CM | POA: Diagnosis not present

## 2015-11-29 DIAGNOSIS — Z510 Encounter for antineoplastic radiation therapy: Secondary | ICD-10-CM | POA: Diagnosis not present

## 2015-11-29 DIAGNOSIS — C7982 Secondary malignant neoplasm of genital organs: Secondary | ICD-10-CM | POA: Diagnosis not present

## 2015-11-29 DIAGNOSIS — Z880 Allergy status to penicillin: Secondary | ICD-10-CM | POA: Diagnosis not present

## 2015-11-29 DIAGNOSIS — R011 Cardiac murmur, unspecified: Secondary | ICD-10-CM | POA: Diagnosis not present

## 2015-11-29 DIAGNOSIS — D649 Anemia, unspecified: Secondary | ICD-10-CM | POA: Diagnosis not present

## 2015-11-29 DIAGNOSIS — C541 Malignant neoplasm of endometrium: Secondary | ICD-10-CM | POA: Diagnosis not present

## 2015-11-30 ENCOUNTER — Ambulatory Visit
Admission: RE | Admit: 2015-11-30 | Discharge: 2015-11-30 | Disposition: A | Discharge status: Home / Self Care | Payer: BLUE CROSS/BLUE SHIELD | Source: Ambulatory Visit | Attending: Radiation Oncology | Admitting: Radiation Oncology

## 2015-11-30 ENCOUNTER — Ambulatory Visit: Payer: BLUE CROSS/BLUE SHIELD | Admitting: Oncology

## 2015-11-30 DIAGNOSIS — F17210 Nicotine dependence, cigarettes, uncomplicated: Secondary | ICD-10-CM | POA: Diagnosis not present

## 2015-11-30 DIAGNOSIS — R51 Headache: Secondary | ICD-10-CM | POA: Diagnosis not present

## 2015-11-30 DIAGNOSIS — D649 Anemia, unspecified: Secondary | ICD-10-CM | POA: Diagnosis not present

## 2015-11-30 DIAGNOSIS — C7982 Secondary malignant neoplasm of genital organs: Secondary | ICD-10-CM | POA: Diagnosis not present

## 2015-11-30 DIAGNOSIS — Z880 Allergy status to penicillin: Secondary | ICD-10-CM | POA: Diagnosis not present

## 2015-11-30 DIAGNOSIS — C541 Malignant neoplasm of endometrium: Secondary | ICD-10-CM | POA: Diagnosis not present

## 2015-11-30 DIAGNOSIS — R011 Cardiac murmur, unspecified: Secondary | ICD-10-CM | POA: Diagnosis not present

## 2015-11-30 DIAGNOSIS — Z510 Encounter for antineoplastic radiation therapy: Secondary | ICD-10-CM | POA: Diagnosis not present

## 2015-12-01 ENCOUNTER — Ambulatory Visit
Admission: RE | Admit: 2015-12-01 | Discharge: 2015-12-01 | Disposition: A | Discharge status: Home / Self Care | Payer: BLUE CROSS/BLUE SHIELD | Source: Ambulatory Visit | Attending: Radiation Oncology | Admitting: Radiation Oncology

## 2015-12-01 DIAGNOSIS — Z510 Encounter for antineoplastic radiation therapy: Secondary | ICD-10-CM | POA: Diagnosis not present

## 2015-12-01 DIAGNOSIS — Z880 Allergy status to penicillin: Secondary | ICD-10-CM | POA: Diagnosis not present

## 2015-12-01 DIAGNOSIS — R51 Headache: Secondary | ICD-10-CM | POA: Diagnosis not present

## 2015-12-01 DIAGNOSIS — R011 Cardiac murmur, unspecified: Secondary | ICD-10-CM | POA: Diagnosis not present

## 2015-12-01 DIAGNOSIS — C7982 Secondary malignant neoplasm of genital organs: Secondary | ICD-10-CM | POA: Diagnosis not present

## 2015-12-01 DIAGNOSIS — C541 Malignant neoplasm of endometrium: Secondary | ICD-10-CM | POA: Diagnosis not present

## 2015-12-01 DIAGNOSIS — D649 Anemia, unspecified: Secondary | ICD-10-CM | POA: Diagnosis not present

## 2015-12-01 DIAGNOSIS — F17210 Nicotine dependence, cigarettes, uncomplicated: Secondary | ICD-10-CM | POA: Diagnosis not present

## 2015-12-04 ENCOUNTER — Ambulatory Visit (HOSPITAL_BASED_OUTPATIENT_CLINIC_OR_DEPARTMENT_OTHER): Payer: BLUE CROSS/BLUE SHIELD | Admitting: Nurse Practitioner

## 2015-12-04 ENCOUNTER — Other Ambulatory Visit (HOSPITAL_BASED_OUTPATIENT_CLINIC_OR_DEPARTMENT_OTHER): Payer: BLUE CROSS/BLUE SHIELD

## 2015-12-04 ENCOUNTER — Ambulatory Visit
Admission: RE | Admit: 2015-12-04 | Discharge: 2015-12-04 | Disposition: A | Discharge status: Home / Self Care | Payer: BLUE CROSS/BLUE SHIELD | Source: Ambulatory Visit | Attending: Radiation Oncology | Admitting: Radiation Oncology

## 2015-12-04 ENCOUNTER — Ambulatory Visit (HOSPITAL_BASED_OUTPATIENT_CLINIC_OR_DEPARTMENT_OTHER): Payer: BLUE CROSS/BLUE SHIELD

## 2015-12-04 ENCOUNTER — Encounter: Payer: Self-pay | Admitting: Nurse Practitioner

## 2015-12-04 VITALS — BP 109/61 | HR 72 | Temp 98.1°F | Resp 16

## 2015-12-04 DIAGNOSIS — T8089XA Other complications following infusion, transfusion and therapeutic injection, initial encounter: Secondary | ICD-10-CM

## 2015-12-04 DIAGNOSIS — C541 Malignant neoplasm of endometrium: Secondary | ICD-10-CM | POA: Diagnosis not present

## 2015-12-04 DIAGNOSIS — T80810A Extravasation of vesicant antineoplastic chemotherapy, initial encounter: Secondary | ICD-10-CM | POA: Insufficient documentation

## 2015-12-04 DIAGNOSIS — Z880 Allergy status to penicillin: Secondary | ICD-10-CM | POA: Diagnosis not present

## 2015-12-04 DIAGNOSIS — C7982 Secondary malignant neoplasm of genital organs: Secondary | ICD-10-CM | POA: Diagnosis not present

## 2015-12-04 DIAGNOSIS — IMO0002 Reserved for concepts with insufficient information to code with codable children: Secondary | ICD-10-CM | POA: Insufficient documentation

## 2015-12-04 DIAGNOSIS — R609 Edema, unspecified: Secondary | ICD-10-CM

## 2015-12-04 DIAGNOSIS — Z5111 Encounter for antineoplastic chemotherapy: Secondary | ICD-10-CM

## 2015-12-04 DIAGNOSIS — R51 Headache: Secondary | ICD-10-CM | POA: Diagnosis not present

## 2015-12-04 DIAGNOSIS — D649 Anemia, unspecified: Secondary | ICD-10-CM | POA: Diagnosis not present

## 2015-12-04 DIAGNOSIS — R011 Cardiac murmur, unspecified: Secondary | ICD-10-CM | POA: Diagnosis not present

## 2015-12-04 DIAGNOSIS — Z510 Encounter for antineoplastic radiation therapy: Secondary | ICD-10-CM | POA: Diagnosis not present

## 2015-12-04 DIAGNOSIS — F17210 Nicotine dependence, cigarettes, uncomplicated: Secondary | ICD-10-CM | POA: Diagnosis not present

## 2015-12-04 LAB — COMPREHENSIVE METABOLIC PANEL
ALBUMIN: 3.5 g/dL (ref 3.5–5.0)
ALK PHOS: 64 U/L (ref 40–150)
ALT: 12 U/L (ref 0–55)
ANION GAP: 11 meq/L (ref 3–11)
AST: 11 U/L (ref 5–34)
BUN: 16.3 mg/dL (ref 7.0–26.0)
CALCIUM: 9.4 mg/dL (ref 8.4–10.4)
CO2: 22 mEq/L (ref 22–29)
CREATININE: 0.9 mg/dL (ref 0.6–1.1)
Chloride: 107 mEq/L (ref 98–109)
EGFR: 77 mL/min/{1.73_m2} — ABNORMAL LOW (ref >90)
Glucose: 95 mg/dl (ref 70–140)
Potassium: 3.8 mEq/L (ref 3.5–5.1)
Sodium: 141 mEq/L (ref 136–145)
TOTAL PROTEIN: 6.5 g/dL (ref 6.4–8.3)
Total Bilirubin: <0.3 mg/dL (ref 0.20–1.20)

## 2015-12-04 LAB — CBC WITH DIFFERENTIAL/PLATELET
BASO%: 0.4 % (ref 0.0–2.0)
BASOS ABS: 0 10*3/uL (ref 0.0–0.1)
EOS ABS: 0 10*3/uL (ref 0.0–0.5)
EOS%: 1.1 % (ref 0.0–7.0)
HEMATOCRIT: 35.8 % (ref 34.8–46.6)
HEMOGLOBIN: 11.9 g/dL (ref 11.6–15.9)
LYMPH#: 0.4 10*3/uL — AB (ref 0.9–3.3)
LYMPH%: 14.3 % (ref 14.0–49.7)
MCH: 30.9 pg (ref 25.1–34.0)
MCHC: 33.2 g/dL (ref 31.5–36.0)
MCV: 93.3 fL (ref 79.5–101.0)
MONO#: 0.3 10*3/uL (ref 0.1–0.9)
MONO%: 11.9 % (ref 0.0–14.0)
NEUT#: 2 10*3/uL (ref 1.5–6.5)
NEUT%: 72.3 % (ref 38.4–76.8)
PLATELETS: 179 10*3/uL (ref 145–400)
RBC: 3.84 10*6/uL (ref 3.70–5.45)
RDW: 12.6 % (ref 11.2–14.5)
WBC: 2.8 10*3/uL — ABNORMAL LOW (ref 3.9–10.3)

## 2015-12-04 LAB — MAGNESIUM: MAGNESIUM: 1.8 mg/dL (ref 1.5–2.5)

## 2015-12-04 MED ORDER — SODIUM CHLORIDE 0.9 % IV SOLN
Freq: Once | INTRAVENOUS | Status: AC
  Administered 2015-12-04: 11:00:00 via INTRAVENOUS

## 2015-12-04 MED ORDER — SODIUM CHLORIDE 0.9 % IV SOLN
Freq: Once | INTRAVENOUS | Status: AC
  Administered 2015-12-04: 12:00:00 via INTRAVENOUS
  Filled 2015-12-04: qty 4

## 2015-12-04 MED ORDER — SODIUM CHLORIDE 0.9 % IV SOLN
Freq: Once | INTRAVENOUS | Status: AC
  Administered 2015-12-04: 11:00:00 via INTRAVENOUS
  Filled 2015-12-04: qty 5

## 2015-12-04 MED ORDER — POTASSIUM CHLORIDE 2 MEQ/ML IV SOLN
Freq: Once | INTRAVENOUS | Status: AC
  Administered 2015-12-04: 12:00:00 via INTRAVENOUS
  Filled 2015-12-04: qty 10

## 2015-12-04 MED ORDER — SODIUM CHLORIDE 0.9 % IV SOLN
40.0000 mg/m2 | Freq: Once | INTRAVENOUS | Status: AC
  Administered 2015-12-04: 69 mg via INTRAVENOUS
  Filled 2015-12-04: qty 69

## 2015-12-04 NOTE — Progress Notes (Signed)
1445 - Patient c/o discomfort at IV site.  Noted edema proximal to IV insertion site.  IV infusion immediately stopped.  Attempted to aspirate from IV site without blood return.  Ice pack applied.  2nd IV site achieved successfully and infusion restarted via that site without further incident.  Dr. Marko Plume and Selena Lesser, NP notified.  Cyndee came to treatment room to see patient.  Patient educated to use ice to affected area every 8 hours for 15 minutes x 3 days.  Patient verbalized understanding.

## 2015-12-04 NOTE — Patient Instructions (Addendum)
[facility] Discharge Instructions for Patients Receiving Chemotherapy  Today you received the following chemotherapy agents:  Cisplatin.  To help prevent nausea and vomiting after your treatment, we encourage you to take your nausea medication as directed.   If you develop nausea and vomiting that is not controlled by your nausea medication, call the clinic.   BELOW ARE SYMPTOMS THAT SHOULD BE REPORTED IMMEDIATELY:  *FEVER GREATER THAN 100.5 F  *CHILLS WITH OR WITHOUT FEVER  NAUSEA AND VOMITING THAT IS NOT CONTROLLED WITH YOUR NAUSEA MEDICATION  *UNUSUAL SHORTNESS OF BREATH  *UNUSUAL BRUISING OR BLEEDING  TENDERNESS IN MOUTH AND THROAT WITH OR WITHOUT PRESENCE OF ULCERS  *URINARY PROBLEMS  *BOWEL PROBLEMS  UNUSUAL RASH Items with * indicate a potential emergency and should be followed up as soon as possible.  Feel free to call the clinic you have any questions or concerns. The clinic phone number is [phone].  Please show the [place] at check-in to the Emergency Department and triage nurse.   IV Infiltration Sometimes fluid from your IV leaks under your skin. This is called an infiltration. HOME CARE  Put an ice pack on the puffy (swollen) area until the puffiness goes down. Place the ice pack on the swollen area 4 times a day for 10 minutes at a time.  Put ice in a plastic bag.  Place the towel between your skin and the bag.  Keep the swollen area above your chest as much as you can until the swelling goes down. Prop your arm up on at least 3 to 4 pillows.  Avoid tight fitting clothing, watches, or jewelry near the swollen area. GET HELP RIGHT AWAY IF:   The skin around the place where the needle was put in becomes darker and peels.  You have red streaks on your skin from the place where the needle was put in.  Yellowish white fluid (pus) comes out from the place where the needle was put in.  You have a temperature by mouth of  102 F (38.9 C). MAKE SURE YOU:  Understand these instructions.  Will watch your condition.  Will get help right away if you are not doing well or get worse.   This information is not intended to replace advice given to you by your health care provider. Make sure you discuss any questions you have with your health care provider.   Document Released: 01/02/2008 Document Revised: 06/17/2011 Document Reviewed: 01/11/2009 Elsevier Interactive Patient Education 2016 [place] ice pack to affected area for 15-20 minutes four times daily as tolerated.  Elevate the affected site for 24-48 hours.  Protect from sunlight.  Return to clinic for evaluation in 24 hours, 48 hours and 1 week.  See appointments on AVS.  Call the clinic with any further questions or problems.

## 2015-12-04 NOTE — Progress Notes (Signed)
SYMPTOM MANAGEMENT CLINIC    Chief Complaint: Extravasation  HPI:  Karina Briggs 55 y.o. 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  Currently undergoing cisplatin chemotherapy and radiation treatments.    No history exists.    Review of Systems  Skin:       Right radial wrist with mild edema only.  All other systems reviewed and are negative.   Past Medical History:  Diagnosis Date  . Anemia    hx of with pregnancy  . Cancer [facility])    endometrial  . Dizziness - light-headed 02/2010   treated by ENT and neurology  . Headache    occasionally  . Heart murmur    hx of with pregnancy    Past Surgical History:  Procedure Laterality Date  . DILATION AND CURETTAGE, DIAGNOSTIC / THERAPEUTIC  1990  . ROBOTIC ASSISTED TOTAL HYSTERECTOMY WITH BILATERAL SALPINGO OOPHERECTOMY Bilateral 11/01/2014   Procedure: ROBOTIC ASSISTED TOTAL LAPARSCOPIC  HYSTERECTOMY WITH BILATERAL SALPINGO [place] NODE MAPPING, LYMPHADENECTOMY;  Surgeon: [name], MD;  Location: [facility];  Service: Gynecology;  Laterality: Bilateral;  . 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    is allergic to penicillins.    Medication List       Accurate as of 12/04/15  4:48 PM. Always use your most recent med list.          famotidine 20 MG tablet Commonly known as:  PEPCID Take 20 mg by mouth 2 (two) times daily.   ondansetron 8 MG tablet Commonly known as:  ZOFRAN Take 1 tablet (8 mg total) by mouth every 8 (eight) hours as needed for nausea or vomiting. Will not make drowsy.   pantoprazole 40 MG tablet Commonly known as:  PROTONIX Take 1 tablet (40 mg total) by mouth daily.     polyethylene glycol packet Commonly known as:  MIRALAX / GLYCOLAX Take 17 g by mouth 2 (two) times daily as needed.   prochlorperazine 10 MG tablet Commonly known as:  COMPAZINE Take 1 tablet (10 mg total) by mouth every 6 (six) hours as needed for nausea or vomiting. May make drowsy.        PHYSICAL EXAMINATION  Oncology Vitals 12/04/2015 11/28/2015  Height - 155 cm  Weight - 70.171 kg  Weight (lbs) - 154 lbs 11 oz  BMI (kg/m2) - 29.23 kg/m2  Temp 98.1 98.2  Pulse 68 69  Resp 16 -  SpO2 100 100  BSA (m2) - 1.74 m2   BP Readings from Last 2 Encounters:  12/04/15 (!) 95/58  11/28/15 (!) 105/59    Physical Exam  Constitutional: She is oriented to person, place, and time and well-developed, well-nourished, and in no distress.  HENT:  Head: Normocephalic and atraumatic.  Eyes: Conjunctivae and EOM are normal. Pupils are equal, round, and reactive to light.  Neck: Normal range of motion.  Pulmonary/Chest: Effort normal. No respiratory distress.  Musculoskeletal: Normal range of motion. She exhibits edema. She exhibits no tenderness.  Neurological: She is alert and oriented to person, place, and time.  Skin: Skin is warm. No rash noted. No erythema.  Mild edema to the right radial area only.  No erythema.  Also, no tenderness to the site.  Psychiatric: Affect normal.  Nursing note  and vitals reviewed.   LABORATORY DATA:. Appointment on 12/04/2015  Component Date Value Ref Range Status  . Sodium 12/04/2015 141  136 - 145 mEq/L Final  . Potassium 12/04/2015 3.8  3.5 - 5.1 mEq/L Final  . Chloride 12/04/2015 107  98 - 109 mEq/L Final  . CO2 12/04/2015 22  22 - 29 mEq/L Final  . Glucose 12/04/2015 95  70 - 140 mg/dl Final  . BUN 12/04/2015 16.3  7.0 - 26.0 mg/dL Final  . Creatinine 12/04/2015 0.9  0.6 - 1.1 mg/dL Final  . Total Bilirubin 12/04/2015 <0.30  0.20 - 1.20 mg/dL Final  . Alkaline Phosphatase 12/04/2015 64  40 - 150 U/L Final  . AST 12/04/2015 11  5 - 34 U/L  Final  . ALT 12/04/2015 12  0 - 55 U/L Final  . Total Protein 12/04/2015 6.5  6.4 - 8.3 g/dL Final  . Albumin 12/04/2015 3.5  3.5 - 5.0 g/dL Final  . Calcium 12/04/2015 9.4  8.4 - 10.4 mg/dL Final  . Anion Gap 12/04/2015 11  3 - 11 mEq/L Final  . EGFR 12/04/2015 77* >90 ml/min/1.73 m2 Final  . Magnesium 12/04/2015 1.8  1.5 - 2.5 mg/dl Final  . WBC 12/04/2015 2.8* 3.9 - 10.3 10e3/uL Final  . NEUT# 12/04/2015 2.0  1.5 - 6.5 10e3/uL Final  . HGB 12/04/2015 11.9  11.6 - 15.9 g/dL Final  . HCT 12/04/2015 35.8  34.8 - 46.6 % Final  . Platelets 12/04/2015 179  145 - 400 10e3/uL Final  . MCV 12/04/2015 93.3  79.5 - 101.0 fL Final  . MCH 12/04/2015 30.9  25.1 - 34.0 pg Final  . MCHC 12/04/2015 33.2  31.5 - 36.0 g/dL Final  . RBC 12/04/2015 3.84  3.70 - 5.45 10e6/uL Final  . RDW 12/04/2015 12.6  11.2 - 14.5 % Final  . lymph# 12/04/2015 0.4* 0.9 - 3.3 10e3/uL Final  . MONO# 12/04/2015 0.3  0.1 - 0.9 10e3/uL Final  . Eosinophils Absolute 12/04/2015 0.0  0.0 - 0.5 10e3/uL Final  . Basophils Absolute 12/04/2015 0.0  0.0 - 0.1 10e3/uL Final  . NEUT% 12/04/2015 72.3  38.4 - 76.8 % Final  . LYMPH% 12/04/2015 14.3  14.0 - 49.7 % Final  . MONO% 12/04/2015 11.9  0.0 - 14.0 % Final  . EOS% 12/04/2015 1.1  0.0 - 7.0 % Final  . BASO% 12/04/2015 0.4  0.0 - 2.0 % Final    RADIOGRAPHIC STUDIES: No results found.  ASSESSMENT/PLAN:    Extravasation, infusion or chemotherapeutic agent Patient presented to the [place] today to receive cycle 5 of the weekly cisplatin chemotherapy regimen.  She was in the midst of her cisplatin infusion; but it was noted that she had experienced an extravasation of the chemotherapy infusion  From her peripheral IV insertion site to the right hand.  Patient had no complaints.  Infusion was immediately held.  Exam today revealed some mild edema at the right radial/wrist region.  There was no erythema, warmth, or tenderness.    Her extravasation protocol-patient will return  tomorrow and in 48 hours for recheck of the site.  Patient will also need to return next week for a final check of the site as well.  Patient was advised to elevate her right upper extremity.  She may also apply cool moist compresses to the site as well.  Patient was advised to call/return of electrical to the emergency department for any worsening symptoms whatsoever.    Endometrial cancer [facility]) Patient  presented to the [place] today to receive cycle 5 of her weekly cisplatin chemotherapy regimen.  She also continues to undergo radiation treatments on a daily basis.  Her final radiation treatment is scheduled for Wednesday, 12/06/2015.  She is scheduled for labs and a follow-up visit on 12/12/2015.     Patient stated understanding of all instructions; and was in agreement with this plan of care. The patient knows to call the clinic with any problems, questions or concerns.   Total time spent with patient was 15 minutes;  with greater than 75 percent of that time spent in face to face counseling regarding patient's symptoms,  and coordination of care and follow up.  Disclaimer:This dictation was prepared with Dragon/digital dictation along with [employer]. Any transcriptional errors that result from this process are unintentional.  Drue Second, NP 12/04/2015

## 2015-12-04 NOTE — Assessment & Plan Note (Signed)
Patient presented to the [place] today to receive cycle 5 of her weekly cisplatin chemotherapy regimen.  She also continues to undergo radiation treatments on a daily basis.  Her final radiation treatment is scheduled for Wednesday, 12/06/2015.  She is scheduled for labs and a follow-up visit on 12/12/2015.

## 2015-12-04 NOTE — Assessment & Plan Note (Signed)
Patient presented to the [place] today to receive cycle 5 of the weekly cisplatin chemotherapy regimen.  She was in the midst of her cisplatin infusion; but it was noted that she had experienced an extravasation of the chemotherapy infusion  From her peripheral IV insertion site to the right hand.  Patient had no complaints.  Infusion was immediately held.  Exam today revealed some mild edema at the right radial/wrist region.  There was no erythema, warmth, or tenderness.    Her extravasation protocol-patient will return tomorrow and in 48 hours for recheck of the site.  Patient will also need to return next week for a final check of the site as well.  Patient was advised to elevate her right upper extremity.  She may also apply cool moist compresses to the site as well.  Patient was advised to call/return of electrical to the emergency department for any worsening symptoms whatsoever.

## 2015-12-05 ENCOUNTER — Ambulatory Visit
Admission: RE | Admit: 2015-12-05 | Discharge: 2015-12-05 | Disposition: A | Discharge status: Home / Self Care | Payer: BLUE CROSS/BLUE SHIELD | Source: Ambulatory Visit | Attending: Radiation Oncology | Admitting: Radiation Oncology

## 2015-12-05 ENCOUNTER — Encounter: Payer: Self-pay | Admitting: Radiation Oncology

## 2015-12-05 ENCOUNTER — Ambulatory Visit (HOSPITAL_BASED_OUTPATIENT_CLINIC_OR_DEPARTMENT_OTHER): Payer: BLUE CROSS/BLUE SHIELD

## 2015-12-05 VITALS — BP 96/85 | HR 68 | Temp 97.9°F | Ht 61.0 in | Wt 156.4 lb

## 2015-12-05 DIAGNOSIS — R51 Headache: Secondary | ICD-10-CM | POA: Diagnosis not present

## 2015-12-05 DIAGNOSIS — Z510 Encounter for antineoplastic radiation therapy: Secondary | ICD-10-CM | POA: Diagnosis not present

## 2015-12-05 DIAGNOSIS — C541 Malignant neoplasm of endometrium: Secondary | ICD-10-CM | POA: Diagnosis not present

## 2015-12-05 DIAGNOSIS — C7982 Secondary malignant neoplasm of genital organs: Secondary | ICD-10-CM

## 2015-12-05 DIAGNOSIS — R011 Cardiac murmur, unspecified: Secondary | ICD-10-CM | POA: Diagnosis not present

## 2015-12-05 DIAGNOSIS — D649 Anemia, unspecified: Secondary | ICD-10-CM | POA: Diagnosis not present

## 2015-12-05 DIAGNOSIS — F17210 Nicotine dependence, cigarettes, uncomplicated: Secondary | ICD-10-CM | POA: Diagnosis not present

## 2015-12-05 DIAGNOSIS — Z880 Allergy status to penicillin: Secondary | ICD-10-CM | POA: Diagnosis not present

## 2015-12-05 NOTE — Progress Notes (Signed)
  Radiation Oncology         [phone] ________________________________  Name: Karina Briggs MRN: [id number]  Date: 12/05/2015  DOB: 12/28/1960  Weekly Radiation Therapy Management    ICD-9-CM ICD-10-CM   1. Secondary malignant neoplasm of vagina (HCC) 198.82 C79.82     Current Dose: 43.2 Gy     Planned Dose:  45 + Gy  Narrative . . . . . . . . The patient presents for routine under treatment assessment.                              Weight and vitals stable. Karina Briggs has completed 24 fractions to her pelvis.  She denies having pain, bladder issues or vaginal bleeding.  She reports having loose stools over the weekend but has not had a bowel movement since Sunday.  She has not taken Imodium.  She reports occasional nausea and takes compazine prn.  She reports having fatigue.  She had chemotherapy yesterday.  Her right hand is swollen from an IV that extravasated. Patient reports that she has her last chemotherapy session next week.                                   Set-up films were reviewed.                                 The chart was checked. Physical Findings. . .  height is 5\' 1"  (1.549 m) and weight is 156 lb 6.4 oz (70.9 kg). Her oral temperature is 97.9 F (36.6 C). Her blood pressure is 96/85 and her pulse is 68. Her oxygen saturation is 100%.   Lungs are clear to auscultation bilaterally. Heart has regular rate and rhythm. No palpable cervical, supraclavicular, or axillary adenopathy. Abdomen soft, non-tender, normal bowel sounds. On pelvic exam, the tumor mass has decreased nicely with her treatment thus far.  No obvious mucosal legion noted in the vaginal vault. Rectovaginal exam confirms above findings.   Impression . . . . . . . The patient is tolerating radiation. Plan . . . . . . . . . . . . Continue treatment as planned. Recommended to the patient that she continues with 3 external beam treatments directed to the site of recurrence .  We will then proceed with  brachytherapy.  ________________________________   Blair Promise, PhD, MD  This document serves as a record of services personally performed by Gery Pray, MD. It was created on his behalf by Truddie Hidden, a trained medical scribe. The creation of this record is based on the scribe's personal observations and the provider's statements to them. This document has been checked and approved by the attending provider.

## 2015-12-05 NOTE — Progress Notes (Signed)
Karina Briggs has completed 24 fractions to her pelvis.  She denies having pain, bladder issues or vaginal bleeding.  She reports having loose stools over the weekend but has not had a bowel movement since Sunday.  She has not taken Imodium.  She reports occasional nausea and takes compazine prn.  She reports having fatigue.  She had chemotherapy yesterday.  Her right hand is swollen from an IV that extravasated.  BP 96/85 (BP Location: Left Arm, Patient Position: Sitting)   Pulse 68   Temp 97.9 F (36.6 C) (Oral)   Ht 5\' 1"  (1.549 m)   Wt 156 lb 6.4 oz (70.9 kg)   LMP 08/01/2011   SpO2 100%   BMI 29.55 kg/m    Wt Readings from Last 3 Encounters:  12/05/15 156 lb 6.4 oz (70.9 kg)  11/28/15 154 lb 11.2 oz (70.2 kg)  11/27/15 155 lb (70.3 kg)

## 2015-12-06 ENCOUNTER — Ambulatory Visit: Payer: BLUE CROSS/BLUE SHIELD

## 2015-12-06 ENCOUNTER — Ambulatory Visit
Admission: RE | Admit: 2015-12-06 | Discharge: 2015-12-06 | Disposition: A | Discharge status: Home / Self Care | Payer: BLUE CROSS/BLUE SHIELD | Source: Ambulatory Visit | Attending: Radiation Oncology | Admitting: Radiation Oncology

## 2015-12-06 DIAGNOSIS — R51 Headache: Secondary | ICD-10-CM | POA: Diagnosis not present

## 2015-12-06 DIAGNOSIS — C541 Malignant neoplasm of endometrium: Secondary | ICD-10-CM | POA: Diagnosis not present

## 2015-12-06 DIAGNOSIS — R011 Cardiac murmur, unspecified: Secondary | ICD-10-CM | POA: Diagnosis not present

## 2015-12-06 DIAGNOSIS — Z510 Encounter for antineoplastic radiation therapy: Secondary | ICD-10-CM | POA: Diagnosis not present

## 2015-12-06 DIAGNOSIS — Z880 Allergy status to penicillin: Secondary | ICD-10-CM | POA: Diagnosis not present

## 2015-12-06 DIAGNOSIS — D649 Anemia, unspecified: Secondary | ICD-10-CM | POA: Diagnosis not present

## 2015-12-06 DIAGNOSIS — C7982 Secondary malignant neoplasm of genital organs: Secondary | ICD-10-CM | POA: Diagnosis not present

## 2015-12-06 DIAGNOSIS — F17210 Nicotine dependence, cigarettes, uncomplicated: Secondary | ICD-10-CM | POA: Diagnosis not present

## 2015-12-06 NOTE — Progress Notes (Signed)
Pt scheduled today for extravasation recheck. Site of R hand free of erythema, warmth, tenderness, and edema. Reinforced use of cool compresses and elevation. Pt verbalized understanding. Form for second check 48 hours post filled out and given to Dr. Mariana Kaufman RN to third check on 9/5 at MD appt.

## 2015-12-07 ENCOUNTER — Ambulatory Visit
Admission: RE | Admit: 2015-12-07 | Discharge: 2015-12-07 | Disposition: A | Discharge status: Home / Self Care | Payer: BLUE CROSS/BLUE SHIELD | Source: Ambulatory Visit | Attending: Radiation Oncology | Admitting: Radiation Oncology

## 2015-12-07 DIAGNOSIS — C7982 Secondary malignant neoplasm of genital organs: Secondary | ICD-10-CM | POA: Diagnosis not present

## 2015-12-07 DIAGNOSIS — Z510 Encounter for antineoplastic radiation therapy: Secondary | ICD-10-CM | POA: Diagnosis not present

## 2015-12-07 DIAGNOSIS — F17210 Nicotine dependence, cigarettes, uncomplicated: Secondary | ICD-10-CM | POA: Diagnosis not present

## 2015-12-07 DIAGNOSIS — R011 Cardiac murmur, unspecified: Secondary | ICD-10-CM | POA: Diagnosis not present

## 2015-12-07 DIAGNOSIS — R51 Headache: Secondary | ICD-10-CM | POA: Diagnosis not present

## 2015-12-07 DIAGNOSIS — C541 Malignant neoplasm of endometrium: Secondary | ICD-10-CM | POA: Diagnosis not present

## 2015-12-07 DIAGNOSIS — Z880 Allergy status to penicillin: Secondary | ICD-10-CM | POA: Diagnosis not present

## 2015-12-07 DIAGNOSIS — D649 Anemia, unspecified: Secondary | ICD-10-CM | POA: Diagnosis not present

## 2015-12-08 ENCOUNTER — Ambulatory Visit
Admission: RE | Admit: 2015-12-08 | Discharge: 2015-12-08 | Disposition: A | Discharge status: Home / Self Care | Payer: BLUE CROSS/BLUE SHIELD | Source: Ambulatory Visit | Attending: Radiation Oncology | Admitting: Radiation Oncology

## 2015-12-08 DIAGNOSIS — R011 Cardiac murmur, unspecified: Secondary | ICD-10-CM | POA: Diagnosis not present

## 2015-12-08 DIAGNOSIS — F17210 Nicotine dependence, cigarettes, uncomplicated: Secondary | ICD-10-CM | POA: Diagnosis not present

## 2015-12-08 DIAGNOSIS — D649 Anemia, unspecified: Secondary | ICD-10-CM | POA: Diagnosis not present

## 2015-12-08 DIAGNOSIS — Z510 Encounter for antineoplastic radiation therapy: Secondary | ICD-10-CM | POA: Diagnosis not present

## 2015-12-08 DIAGNOSIS — R51 Headache: Secondary | ICD-10-CM | POA: Diagnosis not present

## 2015-12-08 DIAGNOSIS — C541 Malignant neoplasm of endometrium: Secondary | ICD-10-CM | POA: Diagnosis not present

## 2015-12-08 DIAGNOSIS — C7982 Secondary malignant neoplasm of genital organs: Secondary | ICD-10-CM | POA: Diagnosis not present

## 2015-12-08 DIAGNOSIS — Z880 Allergy status to penicillin: Secondary | ICD-10-CM | POA: Diagnosis not present

## 2015-12-11 ENCOUNTER — Other Ambulatory Visit: Payer: Self-pay | Admitting: Oncology

## 2015-12-12 ENCOUNTER — Encounter: Payer: Self-pay | Admitting: Oncology

## 2015-12-12 ENCOUNTER — Ambulatory Visit (HOSPITAL_BASED_OUTPATIENT_CLINIC_OR_DEPARTMENT_OTHER): Payer: BLUE CROSS/BLUE SHIELD

## 2015-12-12 ENCOUNTER — Ambulatory Visit
Admission: RE | Admit: 2015-12-12 | Discharge: 2015-12-12 | Disposition: A | Discharge status: Home / Self Care | Payer: BLUE CROSS/BLUE SHIELD | Source: Ambulatory Visit | Attending: Radiation Oncology | Admitting: Radiation Oncology

## 2015-12-12 ENCOUNTER — Ambulatory Visit (HOSPITAL_BASED_OUTPATIENT_CLINIC_OR_DEPARTMENT_OTHER): Payer: BLUE CROSS/BLUE SHIELD | Admitting: Oncology

## 2015-12-12 ENCOUNTER — Other Ambulatory Visit: Payer: BLUE CROSS/BLUE SHIELD

## 2015-12-12 ENCOUNTER — Encounter: Payer: Self-pay | Admitting: Radiation Oncology

## 2015-12-12 ENCOUNTER — Ambulatory Visit: Payer: BLUE CROSS/BLUE SHIELD

## 2015-12-12 VITALS — BP 106/60 | HR 70 | Temp 98.2°F | Resp 18 | Ht 61.0 in | Wt 155.6 lb

## 2015-12-12 DIAGNOSIS — Z720 Tobacco use: Secondary | ICD-10-CM | POA: Diagnosis not present

## 2015-12-12 DIAGNOSIS — R011 Cardiac murmur, unspecified: Secondary | ICD-10-CM | POA: Diagnosis not present

## 2015-12-12 DIAGNOSIS — K219 Gastro-esophageal reflux disease without esophagitis: Secondary | ICD-10-CM

## 2015-12-12 DIAGNOSIS — C541 Malignant neoplasm of endometrium: Secondary | ICD-10-CM | POA: Diagnosis not present

## 2015-12-12 DIAGNOSIS — R51 Headache: Secondary | ICD-10-CM | POA: Diagnosis not present

## 2015-12-12 DIAGNOSIS — Z510 Encounter for antineoplastic radiation therapy: Secondary | ICD-10-CM | POA: Diagnosis not present

## 2015-12-12 DIAGNOSIS — C7982 Secondary malignant neoplasm of genital organs: Secondary | ICD-10-CM | POA: Diagnosis not present

## 2015-12-12 DIAGNOSIS — D649 Anemia, unspecified: Secondary | ICD-10-CM | POA: Diagnosis not present

## 2015-12-12 DIAGNOSIS — T8089XA Other complications following infusion, transfusion and therapeutic injection, initial encounter: Secondary | ICD-10-CM

## 2015-12-12 DIAGNOSIS — Z5111 Encounter for antineoplastic chemotherapy: Secondary | ICD-10-CM

## 2015-12-12 DIAGNOSIS — F17210 Nicotine dependence, cigarettes, uncomplicated: Secondary | ICD-10-CM | POA: Diagnosis not present

## 2015-12-12 DIAGNOSIS — Z880 Allergy status to penicillin: Secondary | ICD-10-CM | POA: Diagnosis not present

## 2015-12-12 LAB — CBC WITH DIFFERENTIAL/PLATELET
BASO%: 0.6 % (ref 0.0–2.0)
Basophils Absolute: 0 10*3/uL (ref 0.0–0.1)
EOS%: 1.5 % (ref 0.0–7.0)
Eosinophils Absolute: 0 10*3/uL (ref 0.0–0.5)
HCT: 33.5 % — ABNORMAL LOW (ref 34.8–46.6)
HEMOGLOBIN: 11.1 g/dL — AB (ref 11.6–15.9)
LYMPH%: 18.2 % (ref 14.0–49.7)
MCH: 30.9 pg (ref 25.1–34.0)
MCHC: 33.1 g/dL (ref 31.5–36.0)
MCV: 93.3 fL (ref 79.5–101.0)
MONO#: 0.3 10*3/uL (ref 0.1–0.9)
MONO%: 14.4 % — AB (ref 0.0–14.0)
NEUT%: 65.3 % (ref 38.4–76.8)
NEUTROS ABS: 1.2 10*3/uL — AB (ref 1.5–6.5)
Platelets: 139 10*3/uL — ABNORMAL LOW (ref 145–400)
RBC: 3.59 10*6/uL — ABNORMAL LOW (ref 3.70–5.45)
RDW: 12.4 % (ref 11.2–14.5)
WBC: 1.9 10*3/uL — AB (ref 3.9–10.3)
lymph#: 0.3 10*3/uL — ABNORMAL LOW (ref 0.9–3.3)

## 2015-12-12 LAB — COMPREHENSIVE METABOLIC PANEL
ALBUMIN: 3.4 g/dL — AB (ref 3.5–5.0)
ALK PHOS: 58 U/L (ref 40–150)
ALT: 12 U/L (ref 0–55)
ANION GAP: 9 meq/L (ref 3–11)
AST: 11 U/L (ref 5–34)
BUN: 13.3 mg/dL (ref 7.0–26.0)
CO2: 25 mEq/L (ref 22–29)
Calcium: 9.2 mg/dL (ref 8.4–10.4)
Chloride: 107 mEq/L (ref 98–109)
Creatinine: 0.8 mg/dL (ref 0.6–1.1)
EGFR: 78 mL/min/{1.73_m2} — AB (ref >90)
Glucose: 89 mg/dl (ref 70–140)
Potassium: 4.1 mEq/L (ref 3.5–5.1)
Sodium: 141 mEq/L (ref 136–145)
TOTAL PROTEIN: 6.1 g/dL — AB (ref 6.4–8.3)

## 2015-12-12 LAB — MAGNESIUM: MAGNESIUM: 1.9 mg/dL (ref 1.5–2.5)

## 2015-12-12 NOTE — Progress Notes (Signed)
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 She completed IMRT ~ 12-06-15, given with 5 weekly cycles of sensitizing CDDP from 11-06-15 thru 12-04-15. She will have vaginal brachytherapy, scheduling still pending.  She needs CT AP and return visit to Dr Denman George (choice of scan and timing of gyn onc visit per my direct communication with that gyn oncology).   Patient has tolerated treatment well overall, including the 5 weekly cisplatinum treatments. Diarrhea has nearly resolved, now bowels moving once daily tho still loose. GERD has been markedly better on protonix, which she will continue, and mild nausea controlled with prn compazine. She had IV infiltration with CDP on 12-04-15, resolved with conservative measures, follow up per protocol, no discomfort or concerns there now. She has no peripheral neuropathy related to the chemotherapy.  She has some fatigue but is going about regular activities, no SOB, no bleeding, no abdominal or pelvic pain, no LE swelling, no bladder symptoms, no fever or symptoms of infection. She and husband both have remained nonsmoking since start of her treatment, congratulated and encouraged to continue nonsmoking. Remainder of 10 point Review of Systems negative  No PAC  ONCOLOGIC HISTORY  Patient was diagnosed with stage 1A grade 1 endometrioid endometrial carcinoma 10-2014, treated with robotic assisted hysterectomy BSO and bilateral sentinel nodes by Dr Denman George 11-01-14. Path findings additionally were of 25% myometrial invasion (5/28 mm), negative sentinel nodes and no LVSI. She did well until postcoital spotting March 2017. She had follow up with Dr Nori Riis in 08-2015, with PAPs x 2 with endometrial cells and  nodule found at right vaginal cuff. She saw Dr Denman George 10-06-15, with 1.5 cm nodular lesion at right vaginal fornix, mobile and free from rectal wall but infiltrating ~ 1 cm deep to vaginal mucosa. PET was not approved by insurance; CT CAP 10-18-15 had rounded soft tissue at right vaginal cuff 2.3 x 2.0 cm, with no other evidence of malignancy. Recommendation for external beam radiation with sensitizing CDDP, + vaginal brachytherapy. She began IMRT on 11-02-15 and sensitizing CDDP on 11-06-15, given x 5 weekly cycles thru 12-04-15.     Objective:  Vital signs in last 24 hours:  BP 106/60 (BP Location: Left Arm, Patient Position: Sitting)   Pulse 70   Temp 98.2 F (36.8 C) (Oral)   Resp 18   Ht 5\' 1"  (1.549 m)   Wt 155 lb 9.6 oz (70.6 kg)   LMP 08/01/2011   SpO2 100%   BMI 29.40 kg/m  Weight up 1 lb  Looks generally comfortable, very pleasant as always Alert, oriented and appropriate. Ambulatory without difficulty.  No alopecia  HEENT:PERRL, sclerae not icteric. Oral mucosa moist without lesions, posterior pharynx clear.  Neck supple. No JVD.  Lymphatics:no supraclavicular adenopathy Resp: clear to auscultation bilaterally and normal percussion bilaterally Cardio: regular rate and rhythm. No gallop. GI: soft, nontender including epigastrium, not distended, no mass or organomegaly. Normally active bowel sounds.  Musculoskeletal/ Extremities: without pitting edema, cords, tenderness Neuro: no peripheral neuropathy. Otherwise nonfocal. PSYCH appropriate mood and affect Skin without rash, ecchymosis, petechiae   Lab Results:  Results for orders placed or performed in visit on 12/12/15  CBC with Differential  Result Value Ref Range  WBC 1.9 (L) 3.9 - 10.3 10e3/uL   NEUT# 1.2 (L) 1.5 - 6.5 10e3/uL   HGB 11.1 (L) 11.6 - 15.9 g/dL   HCT 33.5 (L) 34.8 - 46.6 %   Platelets 139 (L) 145 - 400 10e3/uL   MCV 93.3 79.5 - 101.0 fL   MCH 30.9 25.1 - 34.0 pg   MCHC 33.1 31.5 - 36.0 g/dL   RBC  3.59 (L) 3.70 - 5.45 10e6/uL   RDW 12.4 11.2 - 14.5 %   lymph# 0.3 (L) 0.9 - 3.3 10e3/uL   MONO# 0.3 0.1 - 0.9 10e3/uL   Eosinophils Absolute 0.0 0.0 - 0.5 10e3/uL   Basophils Absolute 0.0 0.0 - 0.1 10e3/uL   NEUT% 65.3 38.4 - 76.8 %   LYMPH% 18.2 14.0 - 49.7 %   MONO% 14.4 (H) 0.0 - 14.0 %   EOS% 1.5 0.0 - 7.0 %   BASO% 0.6 0.0 - 2.0 %   CMET available after visit normal with exception of T prot 6.1, alb 3.4, including creatinine 0.8, K 4.1, LFTs normal, magnesium 1.9  Studies/Results:  No results found.  Medications: I have reviewed the patient's current medications. Continue protonix at least next couple of months; would be reasonable also to let PCP follow up  DISCUSSION Has done well overall with treatment. She understands that she will have vaginal brachytherapy per Dr Sondra Come.  Per gyn onc, after brachytherapy will need CT AP prior to seeing Dr Denman George. I would like CBC with Dr Serita Grit visit.  Blood counts not unexpectedly a little lower now related to chemo and pelvic radiation, but should recover given time out from that treatment. Will repeat CBC with Dr Serita Grit visit, then expect primary follow up to be with gyn oncology and radiation oncology, with prn back to med oncology.  Patient would like referral to PCP in [place] system, as she has seen listed PCP Dr [name] only once in 2013. After visit I have spoken with [facility] office, only NPs taking new patients there now. WIll let patient know and suggest  try [facility] or other.  Assessment/Plan:  1.vaginal cuff recurrence of endometrial carcinoma: original diagnosis 1A grade 1 endometrioid endometrial carcinoma, treated with robotic assisted hysterectomy BSO bilateral sentinel nodes 11-01-14, Vaginal cuff recurrence identified on follow up exam 08-2015, without other disease by CT 10-18-15. IMRT to area of recurrence and associated pelvic nodal areas with sensitizing CDDPx 5 completed 12-06-15, to be followed  byvaginal brachytherapy then reevaluation CT and to see Dr Denman George.   2.long tobacco: patient and husband stopped smoking at start of this treatment and continue off cigarettes.  Tobacco cessation counseling  3.HA intermittently since start of treatment, resolved off of zofran. 4.up to date mammograms 09-2015 5. Up to date colonoscopy 2014. 6.per patient, slightly low bone density: bone density scan by Dr Nori Riis, not in this EMR. 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 Needs to reestablish/ establish  with PCP: will let her know no availability with MD at [facility] and assist from there as possible 10. Chemo extravasation with CDDP on 12-04-15 resolved  All questions answered and patient is in agreement with recommendations and plans. Time spent 25 min including >50% counseling and coordination of care. CC Drs Rosie Fate, MD   12/12/2015, 9:48 AM

## 2015-12-17 ENCOUNTER — Encounter: Payer: Self-pay | Admitting: Oncology

## 2015-12-17 DIAGNOSIS — C541 Malignant neoplasm of endometrium: Secondary | ICD-10-CM | POA: Insufficient documentation

## 2015-12-17 NOTE — Progress Notes (Signed)
[place] END OF TREATMENT   Name: Karina Briggs Date: December 17, 2015  MRN: [id number] DOB: 1960-08-05   TREATMENT DATES: 11-06-15 thru 12-04-15  REFERRING PHYSICIAN: [name]  DIAGNOSIS: grade 1 endometrial carcioma  STAGE AT START OF TREATMENT: recurrent at vaginal cuff   INTENT: curative   DRUGS OR REGIMENS GIVEN: weekly sensitizing CDDP x 5   MAJOR TOXICITIES: fatigue, anemia, leukopenia   REASON TREATMENT STOPPED: completion of planned course   PERFORMANCE STATUS AT END: 0-1   ONGOING PROBLEMS: fatigue, mild anemia, mild leukopenia   FOLLOW UP PLANS: vaginal brachytherapy, then repeat CTs and follow up with gyn oncology

## 2015-12-20 NOTE — Progress Notes (Incomplete)
°  Radiation Oncology         [phone] ________________________________  Name: Karina Briggs MRN: [id number]  Date: 12/12/2015  DOB: 02-May-1960  End of Treatment Note    ICD-9-CM ICD-10-CM   1. Endometrial cancer (HCC) 182.0 C54.1   2. Secondary malignant neoplasm of vagina (HCC) 198.82 C79.82   DIAGNOSIS:  Stage IA Grade 1 endometrioid endometrial cancer with no lymphovascular space invasion, 5/28 mm (25%) of myometrial invasion and negative lymph nodes, now with vaginal cuff recurrence approximately 1 year out from surgery.  Indication for treatment:  ***       Radiation treatment dates:   11/02/2015-12/12/2015  Site/dose:   ***  Beams/energy:   ***  Narrative: The patient tolerated radiation treatment relatively well.   ***  Plan: The patient has completed radiation treatment. The patient will return to radiation oncology clinic for routine followup in one month. I advised them to call or return sooner if they have any questions or concerns related to their recovery or treatment.  -----------------------------------  Blair Promise, PhD, MD

## 2015-12-28 ENCOUNTER — Ambulatory Visit
Admission: RE | Admit: 2015-12-28 | Discharge: 2015-12-28 | Disposition: A | Discharge status: Home / Self Care | Payer: BLUE CROSS/BLUE SHIELD | Source: Ambulatory Visit | Attending: Radiation Oncology | Admitting: Radiation Oncology

## 2015-12-28 ENCOUNTER — Encounter: Payer: Self-pay | Admitting: Radiation Oncology

## 2015-12-28 VITALS — BP 107/82 | HR 87 | Temp 98.5°F | Ht 61.0 in | Wt 160.0 lb

## 2015-12-28 DIAGNOSIS — C541 Malignant neoplasm of endometrium: Secondary | ICD-10-CM

## 2015-12-28 DIAGNOSIS — Z79899 Other long term (current) drug therapy: Secondary | ICD-10-CM | POA: Insufficient documentation

## 2015-12-28 DIAGNOSIS — Z880 Allergy status to penicillin: Secondary | ICD-10-CM | POA: Diagnosis not present

## 2015-12-28 DIAGNOSIS — C7982 Secondary malignant neoplasm of genital organs: Secondary | ICD-10-CM

## 2015-12-28 NOTE — Progress Notes (Signed)
Removed foley catheter intact.

## 2015-12-28 NOTE — Progress Notes (Signed)
Karina Briggs here for follow up.  She denies having any pain, bladder/bowel issues or vaginal/rectal bleeding.  She reports her energy level is improving.  BP 107/82 (BP Location: Right Arm, Patient Position: Sitting)   Pulse 87   Temp 98.5 F (36.9 C) (Oral)   Ht 5\' 1"  (1.549 m)   Wt 160 lb (72.6 kg)   LMP 08/01/2011   SpO2 100%   BMI 30.23 kg/m    Wt Readings from Last 3 Encounters:  12/28/15 160 lb (72.6 kg)  12/12/15 155 lb 9.6 oz (70.6 kg)  12/05/15 156 lb 6.4 oz (70.9 kg)

## 2015-12-28 NOTE — Progress Notes (Signed)
  Radiation Oncology         [phone] ________________________________  Name: Karina Briggs MRN: [id number]  Date: 12/28/2015  DOB: 1961/01/17  SIMULATION AND TREATMENT PLANNING NOTE HDR BRACHYTHERAPY  DIAGNOSIS:  Stage IA Grade 1 endometrioid endometrial cancer with no lymphovascular space invasion, 5/28 mm (25%) of myometrial invasion and negative lymph nodes, now with vaginal cuff recurrence approximately 1 year out from surgery.  NARRATIVE:  The patient was brought to the [facility].  Identity was confirmed.  All relevant records and images related to the planned course of therapy were reviewed.  The patient freely provided informed written consent to proceed with treatment after reviewing the details related to the planned course of therapy. The consent form was witnessed and verified by the simulation staff.  Then, the patient was set-up in a stable reproducible  supine position for radiation therapy.  CT images were obtained.  Surface markings were placed.  The CT images were loaded into the planning software.  Then the target and avoidance structures were contoured.  Treatment planning then occurred.  The radiation prescription was entered and confirmed.   I have requested : Brachytherapy Isodose Plan and Dosimetry Calculations to plan the radiation distribution.    PLAN:  The patient will receive 6 Gy in 1 fraction directed at the [facility] in the right lateral proximal vagina.  ________________________________  Blair Promise, PhD, MD  This document serves as a record of services personally performed by Gery Pray, MD. It was created on his behalf by Darcus Austin, a trained medical scribe. The creation of this record is based on the scribe's personal observations and the provider's statements to them. This document has been checked and approved by the attending provider.

## 2015-12-28 NOTE — Progress Notes (Signed)
Radiation Oncology         (336) [phone] ________________________________  Name: Karina Briggs MRN: [id number]  Date: 12/28/2015  DOB: 10/31/1960  Vaginal Brachytherapy Procedure Note  CC: Karina Calico, MD Karina Lima, MD    ICD-9-CM ICD-10-CM   1. 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 Endometrial cancer, grade I (HCC) 182.0 C54.1   3. Recurrent carcinoma of endometrium (HCC) 182.0 C54.1     Diagnosis: Stage IA Grade 1 endometrioid endometrial cancer with no lymphovascular space invasion, 5/28 mm (25%) of myometrial invasion and negative lymph nodes, now with vaginal cuff recurrence approximately 1 year out from surgery.  Radiation Treatment Dates: 11/02/2015-12/12/2015 45 Gy in 25 fractions to the pelvis.  Narrative: She returns today for fitting of her custom vaginal cylinder (Capri applicator). She denies having any pain, bladder/bowel issues, or vaginal/rectal bleeding. Only residual side effects of her chemotherapy and external beam radiation is fatigue, which she reports is improving.  ALLERGIES: is allergic to penicillins.  Meds: Current Outpatient Prescriptions  Medication Sig Dispense Refill  . pantoprazole (PROTONIX) 40 MG tablet Take 1 tablet (40 mg total) by mouth daily. 30 tablet 2  . famotidine (PEPCID) 20 MG tablet Take 20 mg by mouth 2 (two) times daily.    . ondansetron (ZOFRAN) 8 MG tablet Take 1 tablet (8 mg total) by mouth every 8 (eight) hours as needed for nausea or vomiting. Will not make drowsy. (Patient not taking: Reported on 12/28/2015) 30 tablet 0  . polyethylene glycol (MIRALAX / GLYCOLAX) packet Take 17 g by mouth 2 (two) times daily as needed.    . prochlorperazine (COMPAZINE) 10 MG tablet Take 1 tablet (10 mg total) by mouth every 6 (six) hours as needed for nausea or vomiting. May make drowsy. (Patient not taking: Reported on 12/28/2015) 20 tablet 0   No current facility-administered medications for this encounter.      Physical Findings: The patient is in no acute distress. Patient is alert and oriented.  height is 5\' 1"  (1.549 m) and weight is 160 lb (72.6 kg). Her oral temperature is 98.5 F (36.9 C). Her blood pressure is 107/82 and her pulse is 87. Her oxygen saturation is 100%.   The patient had a Foley catheter placed without difficulty. Good drainage of the bladder was noted. Approximately 10 cc of sterile water was placed in the Foley balloon. The patient then underwent a pelvic exam. There was no palpable mass in the vaginal vault. The patient then proceeded to placement of a Capri applicator approximately 30 cc of sterile water was placed in the [place] applicator to secure the device in the vaginal vault. The patient tolerated the procedure well. Her husband was bedside for the procedure.  Lab Findings: Lab Results  Component Value Date   WBC 1.9 (L) 12/12/2015   HGB 11.1 (L) 12/12/2015   HCT 33.5 (L) 12/12/2015   MCV 93.3 12/12/2015   PLT 139 (L) 12/12/2015    Radiographic Findings: No results found.  Impression: Stage IA Grade 1 endometrioid endometrial cancer with no lymphovascular space invasion, 5/28 mm (25%) of myometrial invasion and negative lymph nodes, now with vaginal cuff recurrence approximately 1 year out from surgery. The Capri applicator will be used to push dosed towards the area of recurrence in the right proximal lateral vaginal wall area and to limit dose to the bladder and rectum  Successful fitting of the [facility] applicator.  Plan: The patient was transferred to the CT  simulation suite for planning and eventually undergo her first brachytherapy treatment later today.   _______________________________   Blair Promise, PhD, MD  This document serves as a record of services personally performed by Gery Pray, MD. It was created on his behalf by Darcus Austin, a trained medical scribe. The creation of this record is based on the scribe's personal observations and the  provider's statements to them. This document has been checked and approved by the attending provider.

## 2015-12-28 NOTE — Progress Notes (Addendum)
  Radiation Oncology         (336) [phone] ________________________________  Name: Karina Briggs MRN: [id number]  Date: 12/28/2015  DOB: 1960/06/16  CC: [name], MD  [name], MD  HDR BRACHYTHERAPY NOTE  DIAGNOSIS: Stage IA Grade 1 endometrioid endometrial cancer with no lymphovascular space invasion, 5/28 mm (25%) of myometrial invasion and negative lymph nodes, now with vaginal cuff recurrence approximately 1 year out from surgery   Simple treatment device note: Patient had placement of her Capri applicator earlier in the day prior to planning. She will be treated with 6 catheters with multiple dwell positions. This conforms to her anatomy without undue discomfort.  Vaginal brachytherapy procedure node: The patient was brought to the [place] suite. Identity was confirmed. All relevant records and images related to the planned course of therapy were reviewed. The patient freely provided informed written consent to proceed with treatment after reviewing the details related to the planned course of therapy. The consent form was witnessed and verified by the simulation staff. Then, the patient was set-up in a stable reproducible supine position for radiation therapy. The patient's Capri applicator remained in the proximal vagina.  Verification simulation note:  An AP and lateral film was then obtained through the pelvis area. This documented accurate position of the [facility] applicator for treatment.  HDR BRACHYTHERAPY TREATMENT  The remote afterloading device was affixed to the [facility] applicator by catheters. Patient then proceeded to undergo her first high-dose-rate treatment directed at the proximal vagina. The patient was prescribed a dose of 6 gray to the HRCTV. Patient was treated with 6 channels using multiple dwell positions. Treatment time was 437.3 seconds. Iridium 192 was the high-dose-rate source for treatment. The patient tolerated the treatment well. After completion of her therapy,  a radiation survey was performed documenting return of the iridium source into the GammaMed safe.  The patient's Foley catheter was removed and the patient's Capri applicator was deflated and subsequently removed without difficulty. Patient tolerated the removal well   PLAN: The patient will return for her second [place] application next week. ________________________________  Blair Promise, PhD, MD   This document serves as a record of services personally performed by Gery Pray, MD. It was created on his behalf by Darcus Austin, a trained medical scribe. The creation of this record is based on the scribe's personal observations and the provider's statements to them. This document has been checked and approved by the attending provider.

## 2015-12-28 NOTE — Progress Notes (Signed)
Foley catheter inserted.  Clear yellow urine noted.  Secured tubing to patient's leg.  SCD boots on.

## 2015-12-29 DIAGNOSIS — Z880 Allergy status to penicillin: Secondary | ICD-10-CM | POA: Diagnosis not present

## 2015-12-29 DIAGNOSIS — C541 Malignant neoplasm of endometrium: Secondary | ICD-10-CM | POA: Diagnosis not present

## 2015-12-29 DIAGNOSIS — Z79899 Other long term (current) drug therapy: Secondary | ICD-10-CM | POA: Diagnosis not present

## 2016-01-02 ENCOUNTER — Other Ambulatory Visit: Payer: Self-pay

## 2016-01-03 ENCOUNTER — Other Ambulatory Visit: Payer: Self-pay | Admitting: Gynecologic Oncology

## 2016-01-03 ENCOUNTER — Telehealth: Payer: Self-pay | Admitting: *Deleted

## 2016-01-03 DIAGNOSIS — C541 Malignant neoplasm of endometrium: Secondary | ICD-10-CM

## 2016-01-03 NOTE — Telephone Encounter (Signed)
CALLED PATIENT TO REMIND OF APPTS. FOR 01-04-16, SPOKE WITH PATIENT AND SHE IS AWARE OF THESE APPTS.

## 2016-01-04 ENCOUNTER — Ambulatory Visit
Admission: RE | Admit: 2016-01-04 | Discharge: 2016-01-04 | Disposition: A | Discharge status: Home / Self Care | Payer: BLUE CROSS/BLUE SHIELD | Source: Ambulatory Visit | Attending: Radiation Oncology | Admitting: Radiation Oncology

## 2016-01-04 ENCOUNTER — Encounter: Payer: Self-pay | Admitting: Radiation Oncology

## 2016-01-04 ENCOUNTER — Ambulatory Visit: Admission: RE | Admit: 2016-01-04 | Payer: BLUE CROSS/BLUE SHIELD | Source: Ambulatory Visit

## 2016-01-04 VITALS — BP 109/70 | HR 78 | Temp 98.9°F | Ht 61.0 in | Wt 157.1 lb

## 2016-01-04 DIAGNOSIS — C7982 Secondary malignant neoplasm of genital organs: Secondary | ICD-10-CM

## 2016-01-04 DIAGNOSIS — Z79899 Other long term (current) drug therapy: Secondary | ICD-10-CM | POA: Diagnosis not present

## 2016-01-04 DIAGNOSIS — C541 Malignant neoplasm of endometrium: Secondary | ICD-10-CM

## 2016-01-04 DIAGNOSIS — Z880 Allergy status to penicillin: Secondary | ICD-10-CM | POA: Diagnosis not present

## 2016-01-04 NOTE — Progress Notes (Signed)
Foley catheter removed intact.

## 2016-01-04 NOTE — Progress Notes (Signed)
  Radiation Oncology         (336) [phone] ________________________________  Name: Karina Briggs MRN: [id number]  Date: 01/04/2016  DOB: July 02, 1960  CC: [name], MD  [name], MD  HDR BRACHYTHERAPY NOTE  DIAGNOSIS: Stage IA Grade 1 endometrioid endometrial cancer with no lymphovascular space invasion, 5/28 mm (25%) of myometrial invasion and negative lymph nodes, now with vaginal cuff recurrence approximately 1 year out from surgery   Simple treatment device note: Patient had placement of her Capri applicator earlier in the day prior to planning. She will be treated with 6 catheters with multiple dwell positions. This conforms to her anatomy without undue discomfort.  Vaginal brachytherapy procedure node: The patient was brought to the [place] suite. Identity was confirmed. All relevant records and images related to the planned course of therapy were reviewed. The patient freely provided informed written consent to proceed with treatment after reviewing the details related to the planned course of therapy. The consent form was witnessed and verified by the simulation staff. Then, the patient was set-up in a stable reproducible supine position for radiation therapy. The patient's Capri applicator remained in the proximal vagina.  Verification simulation note:  An AP and lateral film was then obtained through the pelvis area. This documented accurate position of the [facility] applicator for treatment.  HDR BRACHYTHERAPY TREATMENT  The remote afterloading device was affixed to the [facility] applicator by catheters. Patient then proceeded to undergo her second high-dose-rate treatment directed at the proximal vagina. The patient was prescribed a dose of 6 gray to the HRCTV. Patient was treated with 7 channels using multiple dwell positions. Treatment time was 447.5 seconds. Iridium 192 was the high-dose-rate source for treatment. The patient tolerated the treatment well. After completion of her  therapy, a radiation survey was performed documenting return of the iridium source into the GammaMed safe.  The patient's Foley catheter was removed and the patient's Capri applicator was deflated and subsequently removed without difficulty. Patient tolerated the removal well. PAS were also removed.   PLAN: The patient will return for her third [place] application next week. ________________________________  Blair Promise, PhD, MD   This document serves as a record of services personally performed by Gery Pray, MD. It was created on his behalf by Darcus Austin, a trained medical scribe. The creation of this record is based on the scribe's personal observations and the provider's statements to them. This document has been checked and approved by the attending provider.

## 2016-01-04 NOTE — Progress Notes (Signed)
  Radiation Oncology         [phone]) [phone] ________________________________  Name: Karina Briggs MRN: [id number]  Date: 01/04/2016  DOB: May 23, 1960  SIMULATION AND TREATMENT PLANNING NOTE HDR BRACHYTHERAPY  DIAGNOSIS:  Stage IA Grade 1 endometrioid endometrial cancer with no lymphovascular space invasion, 5/28 mm (25%) of myometrial invasion and negative lymph nodes, now with vaginal cuff recurrence approximately 1 year out from surgery.  NARRATIVE:  The patient was brought to the [place].  Identity was confirmed.  All relevant records and images related to the planned course of therapy were reviewed.  The patient freely provided informed written consent to proceed with treatment after reviewing the details related to the planned course of therapy. The consent form was witnessed and verified by the simulation staff.  Then, the patient was set-up in a stable reproducible  supine position for radiation therapy.  CT images were obtained.  Surface markings were placed.  The CT images were loaded into the planning software.  Then the target and avoidance structures were contoured.  Treatment planning then occurred.  The radiation prescription was entered and confirmed.   I have requested : Brachytherapy Isodose Plan and Dosimetry Calculations to plan the radiation distribution.    PLAN:  The patient will receive 6 Gy in 1 fraction directed at the [facility] in the right lateral proximal vagina.  ________________________________  Blair Promise, PhD, MD  This document serves as a record of services personally performed by Gery Pray, MD. It was created on his behalf by Darcus Austin, a trained medical scribe. The creation of this record is based on the scribe's personal observations and the provider's statements to them. This document has been checked and approved by the attending provider.

## 2016-01-04 NOTE — Progress Notes (Signed)
Radiation Oncology         [phone] ________________________________  Name: Karina Briggs MRN: [id number]  Date: 01/04/2016  DOB: 10-08-1960  Vaginal Brachytherapy Procedure Note  CC: Karina Calico, MD [name], MD    ICD-9-CM ICD-10-CM   1. 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 Endometrial cancer, grade I (HCC) 182.0 C54.1     Diagnosis: Stage IA Grade 1 endometrioid endometrial cancer with no lymphovascular space invasion, 5/28 mm (25%) of myometrial invasion and negative lymph nodes, now with vaginal cuff recurrence approximately 1 year out from surgery.  Radiation Treatment Dates:  11/02/2015-12/12/2015: 45 Gy in 25 fractions to the pelvis and a vaginal boost of 5.4 Gy in 3 fractions. 12/28/15: HDR vaginal cuff boost of 6 Gy.  Narrative: She returns today for fitting of her custom vaginal cylinder (Capri applicator). She denies having pain or any bowel issues.  She reports dysuria three times after her Foley catheter was removed for her last treatment. She denies having any dysuria since then.  #16 gauge foley catheter inserted with clear yellow urine noted in the drainage bag.  ALLERGIES: is allergic to penicillins.  Meds: Current Outpatient Prescriptions  Medication Sig Dispense Refill  . famotidine (PEPCID) 20 MG tablet Take 20 mg by mouth 2 (two) times daily.    . ondansetron (ZOFRAN) 8 MG tablet Take 1 tablet (8 mg total) by mouth every 8 (eight) hours as needed for nausea or vomiting. Will not make drowsy. (Patient not taking: Reported on 12/28/2015) 30 tablet 0  . pantoprazole (PROTONIX) 40 MG tablet Take 1 tablet (40 mg total) by mouth daily. 30 tablet 2  . polyethylene glycol (MIRALAX / GLYCOLAX) packet Take 17 g by mouth 2 (two) times daily as needed.    . prochlorperazine (COMPAZINE) 10 MG tablet Take 1 tablet (10 mg total) by mouth every 6 (six) hours as needed for nausea or vomiting. May make drowsy. (Patient not taking: Reported on  12/28/2015) 20 tablet 0   No current facility-administered medications for this encounter.     Physical Findings: The patient is in no acute distress. Patient is alert and oriented.  height is 5\' 1"  (1.549 m) and weight is 157 lb 1.6 oz (71.3 kg). Her oral temperature is 98.9 F (37.2 C). Her blood pressure is 109/70 and her pulse is 78. Her oxygen saturation is 100%.   The patient had a Foley catheter placed without difficulty. Good drainage of the bladder was noted. Approximately 10 cc of sterile water was placed in the Foley balloon. The patient then underwent a pelvic exam. There was no palpable mass in the vaginal vault. The patient then proceeded to placement of a Capri applicator approximately 40 cc of sterile water was placed in the [place] applicator to secure the device in the vaginal vault. The patient tolerated the procedure well. Her husband was bedside for the procedure.  Lab Findings: Lab Results  Component Value Date   WBC 1.9 (L) 12/12/2015   HGB 11.1 (L) 12/12/2015   HCT 33.5 (L) 12/12/2015   MCV 93.3 12/12/2015   PLT 139 (L) 12/12/2015    Radiographic Findings: No results found.  Impression: Stage IA Grade 1 endometrioid endometrial cancer with no lymphovascular space invasion, 5/28 mm (25%) of myometrial invasion and negative lymph nodes, now with vaginal cuff recurrence approximately 1 year out from surgery.  The Capri applicator will be used to push dosed towards the area of recurrence in the right proximal  lateral vaginal wall area and to limit dose to the bladder and rectum  Successful fitting of the [facility] applicator.  Plan: The patient was transferred to the [place] for planning and eventually undergo her second brachytherapy treatment later today.   _______________________________   Blair Promise, PhD, MD  This document serves as a record of services personally performed by Gery Pray, MD. It was created on his behalf by Darcus Austin, a  trained medical scribe. The creation of this record is based on the scribe's personal observations and the provider's statements to them. This document has been checked and approved by the attending provider.

## 2016-01-04 NOTE — Progress Notes (Signed)
Karina Briggs is here for her second [place] treatment.  She denies having pain or any bowel issues.  She reports having burning with urination for 3 times after her foley was removed for her last treatment.  She said she is not having any dysuria since then.  #16 gauge foley catheter inserted with clear yellow urine noted in the drainage bag.    BP 109/70 (BP Location: Right Arm, Patient Position: Sitting)   Pulse 78   Temp 98.9 F (37.2 C) (Oral)   Ht 5\' 1"  (1.549 m)   Wt 157 lb 1.6 oz (71.3 kg)   LMP 08/01/2011   SpO2 100%   BMI 29.68 kg/m    Wt Readings from Last 3 Encounters:  01/04/16 157 lb 1.6 oz (71.3 kg)  12/28/15 160 lb (72.6 kg)  12/12/15 155 lb 9.6 oz (70.6 kg)

## 2016-01-04 NOTE — Addendum Note (Signed)
Encounter addended by: Jacqulyn Liner, RN on: 01/04/2016  2:32 PM<BR>    Actions taken: Sign clinical note, Flowsheet accepted

## 2016-01-10 ENCOUNTER — Telehealth: Payer: Self-pay | Admitting: *Deleted

## 2016-01-10 NOTE — Telephone Encounter (Signed)
CALLED PATIENT TO REMIND OF APPTS. FOR 01-11-16, LVM FOR A RETURN CALL

## 2016-01-11 ENCOUNTER — Telehealth: Payer: Self-pay | Admitting: *Deleted

## 2016-01-11 ENCOUNTER — Ambulatory Visit
Admission: RE | Admit: 2016-01-11 | Payer: BLUE CROSS/BLUE SHIELD | Source: Ambulatory Visit | Admitting: Radiation Oncology

## 2016-01-11 ENCOUNTER — Inpatient Hospital Stay: Admission: RE | Admit: 2016-01-11 | Payer: BLUE CROSS/BLUE SHIELD | Source: Ambulatory Visit

## 2016-01-11 ENCOUNTER — Telehealth: Payer: Self-pay

## 2016-01-11 DIAGNOSIS — C541 Malignant neoplasm of endometrium: Secondary | ICD-10-CM

## 2016-01-11 NOTE — Telephone Encounter (Signed)
XXXX 

## 2016-01-11 NOTE — Telephone Encounter (Signed)
Told Karina Briggs appointment time for f/u visit with Dr. Denman George on 02-21-16 at 3:30 pm.  Check in at 3pm. S will have follow up CBC for Dr. Marko Plume on 02-12-16 along with C-met needed for CT scan 02-13-16. Karina Briggs will p/u contrast on 02-12-16 from [facility] scheduler. Karina Briggs verbalized understanding.

## 2016-01-11 NOTE — Telephone Encounter (Signed)
-----   Message from Gordy Levan, MD sent at 12/14/2015  8:29 AM EDT ----- Gyn onc would like to see her with CT after HDR finished. I would like CBC with gyn onc visit.  Not set up yet as we don't know radiation dates, but that is plan if we don't get things coordinated from our end!  Thanks Lennis

## 2016-01-11 NOTE — Telephone Encounter (Signed)
CALLED PATIENT TO INFORM OF HDR CAPRI CASE ON 01-19-16, SPOKE WITH PATIENT AND SHE IS AWARE OF THIS CASE.

## 2016-01-18 ENCOUNTER — Telehealth: Payer: Self-pay | Admitting: *Deleted

## 2016-01-18 NOTE — Telephone Encounter (Signed)
CALLED PATIENT TO REMIND OF APPTS. FOR 01-19-16, LVM FOR A RETURN CALL

## 2016-01-19 ENCOUNTER — Encounter: Payer: Self-pay | Admitting: Radiation Oncology

## 2016-01-19 ENCOUNTER — Ambulatory Visit: Admission: RE | Admit: 2016-01-19 | Payer: BLUE CROSS/BLUE SHIELD | Source: Ambulatory Visit

## 2016-01-19 ENCOUNTER — Ambulatory Visit
Admission: RE | Admit: 2016-01-19 | Discharge: 2016-01-19 | Disposition: A | Discharge status: Home / Self Care | Payer: BLUE CROSS/BLUE SHIELD | Source: Ambulatory Visit | Attending: Radiation Oncology | Admitting: Radiation Oncology

## 2016-01-19 VITALS — BP 103/77 | HR 73 | Temp 98.3°F | Ht 61.0 in | Wt 157.0 lb

## 2016-01-19 VITALS — BP 102/65 | HR 71 | Temp 98.4°F | Resp 16

## 2016-01-19 DIAGNOSIS — C7982 Secondary malignant neoplasm of genital organs: Secondary | ICD-10-CM | POA: Diagnosis not present

## 2016-01-19 DIAGNOSIS — Z880 Allergy status to penicillin: Secondary | ICD-10-CM | POA: Insufficient documentation

## 2016-01-19 DIAGNOSIS — C541 Malignant neoplasm of endometrium: Secondary | ICD-10-CM | POA: Insufficient documentation

## 2016-01-19 DIAGNOSIS — Z79899 Other long term (current) drug therapy: Secondary | ICD-10-CM | POA: Insufficient documentation

## 2016-01-19 NOTE — Progress Notes (Signed)
  Radiation Oncology         (336) [phone] ________________________________  Name: Karina Briggs MRN: [id number]  Date: 01/19/2016  DOB: Apr 29, 1960  SIMULATION AND TREATMENT PLANNING NOTE HDR BRACHYTHERAPY  DIAGNOSIS:  Stage IA Grade 1 endometrioid endometrial cancer with no lymphovascular space invasion, 5/28 mm (25%) of myometrial invasion and negative lymph nodes, now with vaginal cuff recurrenceapproximately 1 year out from surgery.  NARRATIVE:  The patient was brought to the [place].  Identity was confirmed.  All relevant records and images related to the planned course of therapy were reviewed.  The patient freely provided informed written consent to proceed with treatment after reviewing the details related to the planned course of therapy. The consent form was witnessed and verified by the simulation staff.  Then, the patient was set-up in a stable reproducible  supine position for radiation therapy.  CT images were obtained.  Surface markings were placed.  The CT images were loaded into the planning software.  Then the target and avoidance structures were contoured.  Treatment planning then occurred.  The radiation prescription was entered and confirmed.   I have requested : Brachytherapy Isodose Plan and Dosimetry Calculations to plan the radiation distribution.    PLAN:  The patient will receive 6 Gy in 1 fraction  directed at the [facility] in the right lateral proximal vagina.    ________________________________  Blair Promise, PhD, MD

## 2016-01-19 NOTE — Progress Notes (Signed)
  Radiation Oncology         [phone]) [phone] ________________________________  Name: Karina Briggs MRN: [id number]  Date: 01/19/2016  DOB: Oct 11, 1960  CC: [name], MD  [name], MD  HDR BRACHYTHERAPY NOTE  DIAGNOSIS: Stage IA Grade 1 endometrioid endometrial cancer with no lymphovascular space invasion, 5/28 mm (25%) of myometrial invasion and negative lymph nodes, now with vaginal cuff recurrenceapproximately 1 year out from surgery.  Simple treatment device note: Patient had placement of her Capri applicator earlier in the day prior to planning. She will be treated with 6 catheters with multiple dwell positions. This conforms to her anatomy without undue discomfort.  Vaginal brachytherapy procedure node: The patient was brought to the [place] suite. Identity was confirmed. All relevant records and images related to the planned course of therapy were reviewed. The patient freely provided informed written consent to proceed with treatment after reviewing the details related to the planned course of therapy. The consent form was witnessed and verified by the simulation staff. Then, the patient was set-up in a stable reproducible supine position for radiation therapy. The patient's Capri applicator remained in the proximal vagina.  Verification simulation note:  An AP and lateral film was then obtained through the pelvis area. This documented accurate position of the [facility] applicator for treatment.  HDR BRACHYTHERAPY TREATMENT  The remote afterloading device was affixed to the [facility] applicator by catheters. Patient then proceeded to undergo her third high-dose-rate treatment directed at the proximal vagina. The patient was prescribed a dose of 6 gray to the HRCTV. Patient was treated with 6 channels using multiple dwell positions. Treatment time was 180.5 seconds. Iridium 192 was the high-dose-rate source for treatment. The patient tolerated the treatment well. After completion of her  therapy, a radiation survey was performed documenting return of the iridium source into the GammaMed safe.   PLAN: Routine follow-up in one month. ________________________________  Blair Promise, PhD, MD

## 2016-01-19 NOTE — Progress Notes (Signed)
________________________________  Name: Karina Briggs        MRN: [id number]         Date: 01/19/16                      DOB: 07-10-1960 k Vaginal Brachytherapy Procedure Note  CC: Karina Calico, MD Karina Amber, MD    ICD-9-CM ICD-10-CM   1. 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 Endometrial cancer, grade I (HCC) 182.0 C54.1     Diagnosis: Stage IA Grade 1 endometrioid endometrial cancer with no lymphovascular space invasion, 5/28 mm (25%) of myometrial invasion and negative lymph nodes, now with vaginal cuff recurrenceapproximately 1 year out from surgery.  Radiation Treatment Dates:  11/02/2015-12/12/2015: 45 Gy in 25 fractions to the pelvis and a vaginal boost of 5.4 Gy in 3 fractions. 12/28/15: HDR vaginal cuff boost of 6 Gy x 2.  Narrative: She returns today for fitting of her custom vaginal cylinder (Capri applicator). She denies having pain or any bowel issues. #16 gauge foley catheter inserted with clear yellow urine noted in the drainage bag.  ALLERGIES: is allergic to penicillins.  Meds:       Current Outpatient Prescriptions  Medication Sig Dispense Refill  . famotidine (PEPCID) 20 MG tablet Take 20 mg by mouth 2 (two) times daily.    . ondansetron (ZOFRAN) 8 MG tablet Take 1 tablet (8 mg total) by mouth every 8 (eight) hours as needed for nausea or vomiting. Will not make drowsy. (Patient not taking: Reported on 12/28/2015) 30 tablet 0  . pantoprazole (PROTONIX) 40 MG tablet Take 1 tablet (40 mg total) by mouth daily. 30 tablet 2  . polyethylene glycol (MIRALAX / GLYCOLAX) packet Take 17 g by mouth 2 (two) times daily as needed.    . prochlorperazine (COMPAZINE) 10 MG tablet Take 1 tablet (10 mg total) by mouth every 6 (six) hours as needed for nausea or vomiting. May make drowsy. (Patient not taking: Reported on 12/28/2015) 20 tablet 0   No current facility-administered medications for this encounter.     Physical  Findings: The patient is in no acute distress. Patient is alert and oriented.  height is 5\' 1"  (1.549 m) and weight is 157 lb 1.6 oz (71.3 kg). Her oral temperature is 98.9 F (37.2 C). Her blood pressure is 109/70 and her pulse is 78. Her oxygen saturation is 100%.   The patient had a Foley catheter placed without difficulty. Good drainage of the bladder was noted. Approximately 10 cc of sterile water was placed in the Foley balloonThe patient then proceeded to placement of a Capri applicator approximately 40 cc of sterile water was placed in the [place] applicator to secure the device in the vaginal vault. The patient tolerated the procedure well. Her husband was bedside for the procedure.  Lab Findings: Recent Labs       Lab Results  Component Value Date   WBC 1.9 (L) 12/12/2015   HGB 11.1 (L) 12/12/2015   HCT 33.5 (L) 12/12/2015   MCV 93.3 12/12/2015   PLT 139 (L) 12/12/2015      Radiographic Findings: Imaging Results  No results found.    Impression: Stage IA Grade 1 endometrioid endometrial cancer with no lymphovascular space invasion, 5/28 mm (25%) of myometrial invasion and negative lymph nodes, now with vaginal cuff recurrenceapproximately 1 year out from surgery.  The Capri applicator will be used to push dosed towards the area of recurrence in the  right proximal lateral vaginal wall area and to limit dose to the bladder and rectum  Successful fitting of the [facility] applicator.  Plan: The patient was transferred to the CT simulation suite for planning and eventually undergo her third brachytherapy treatment later today.   _______________________________

## 2016-02-12 ENCOUNTER — Other Ambulatory Visit (HOSPITAL_BASED_OUTPATIENT_CLINIC_OR_DEPARTMENT_OTHER): Payer: BLUE CROSS/BLUE SHIELD

## 2016-02-12 DIAGNOSIS — C541 Malignant neoplasm of endometrium: Secondary | ICD-10-CM

## 2016-02-12 DIAGNOSIS — C7982 Secondary malignant neoplasm of genital organs: Secondary | ICD-10-CM

## 2016-02-12 LAB — COMPREHENSIVE METABOLIC PANEL
ALT: 16 U/L (ref 0–55)
AST: 15 U/L (ref 5–34)
Albumin: 3.4 g/dL — ABNORMAL LOW (ref 3.5–5.0)
Alkaline Phosphatase: 62 U/L (ref 40–150)
Anion Gap: 8 mEq/L (ref 3–11)
BUN: 15.1 mg/dL (ref 7.0–26.0)
CALCIUM: 9.5 mg/dL (ref 8.4–10.4)
CHLORIDE: 109 meq/L (ref 98–109)
CO2: 27 meq/L (ref 22–29)
CREATININE: 0.9 mg/dL (ref 0.6–1.1)
EGFR: 72 mL/min/{1.73_m2} — ABNORMAL LOW (ref >90)
Glucose: 116 mg/dl (ref 70–140)
POTASSIUM: 4.5 meq/L (ref 3.5–5.1)
SODIUM: 143 meq/L (ref 136–145)
Total Bilirubin: 0.4 mg/dL (ref 0.20–1.20)
Total Protein: 6.3 g/dL — ABNORMAL LOW (ref 6.4–8.3)

## 2016-02-12 LAB — CBC WITH DIFFERENTIAL/PLATELET
BASO%: 0.5 % (ref 0.0–2.0)
BASOS ABS: 0 10*3/uL (ref 0.0–0.1)
EOS%: 28 % — AB (ref 0.0–7.0)
Eosinophils Absolute: 1.5 10*3/uL — ABNORMAL HIGH (ref 0.0–0.5)
HEMATOCRIT: 35.7 % (ref 34.8–46.6)
HGB: 11.8 g/dL (ref 11.6–15.9)
LYMPH#: 0.6 10*3/uL — AB (ref 0.9–3.3)
LYMPH%: 11.5 % — AB (ref 14.0–49.7)
MCH: 32.8 pg (ref 25.1–34.0)
MCHC: 33.1 g/dL (ref 31.5–36.0)
MCV: 99.2 fL (ref 79.5–101.0)
MONO#: 0.5 10*3/uL (ref 0.1–0.9)
MONO%: 9 % (ref 0.0–14.0)
NEUT#: 2.8 10*3/uL (ref 1.5–6.5)
NEUT%: 51 % (ref 38.4–76.8)
Platelets: 228 10*3/uL (ref 145–400)
RBC: 3.6 10*6/uL — AB (ref 3.70–5.45)
RDW: 15.4 % — ABNORMAL HIGH (ref 11.2–14.5)
WBC: 5.5 10*3/uL (ref 3.9–10.3)

## 2016-02-13 ENCOUNTER — Encounter (HOSPITAL_COMMUNITY): Payer: Self-pay

## 2016-02-13 ENCOUNTER — Ambulatory Visit (HOSPITAL_COMMUNITY)
Admission: RE | Admit: 2016-02-13 | Discharge: 2016-02-13 | Disposition: A | Discharge status: Home / Self Care | Payer: BLUE CROSS/BLUE SHIELD | Source: Ambulatory Visit | Attending: Gynecologic Oncology | Admitting: Gynecologic Oncology

## 2016-02-13 DIAGNOSIS — C541 Malignant neoplasm of endometrium: Secondary | ICD-10-CM | POA: Insufficient documentation

## 2016-02-13 DIAGNOSIS — R1111 Vomiting without nausea: Secondary | ICD-10-CM | POA: Diagnosis not present

## 2016-02-13 DIAGNOSIS — E860 Dehydration: Secondary | ICD-10-CM | POA: Diagnosis not present

## 2016-02-13 DIAGNOSIS — R197 Diarrhea, unspecified: Secondary | ICD-10-CM | POA: Diagnosis not present

## 2016-02-13 MED ORDER — IOPAMIDOL (ISOVUE-300) INJECTION 61%
100.0000 mL | Freq: Once | INTRAVENOUS | Status: AC | PRN
  Administered 2016-02-13: 100 mL via INTRAVENOUS

## 2016-02-14 ENCOUNTER — Telehealth: Payer: Self-pay

## 2016-02-14 DIAGNOSIS — H524 Presbyopia: Secondary | ICD-10-CM | POA: Diagnosis not present

## 2016-02-14 NOTE — Telephone Encounter (Signed)
Told Karina Briggs the results of labs from 02-12-16 as noted below by Dr. Marko Plume.

## 2016-02-14 NOTE — Telephone Encounter (Signed)
-----   Message from Gordy Levan, MD sent at 02/14/2016 10:25 AM EST ----- Labs seen and need follow up: please let her know blood counts have back up into normal ranges and chemistries good.

## 2016-02-16 ENCOUNTER — Telehealth: Payer: Self-pay

## 2016-02-16 NOTE — Telephone Encounter (Signed)
Orders received from [facility] to contact the patient with results from her [place]. "Mild decrease in size of asymmetric soft tissue density involving the right vaginal cuff". No new progressive disease within the abdomen or pelvis. The patient was contacted and updated , patient states understanding, denies further questions at this time . The patient was reminded of her follow up with Dr [name] scheduled on Wed , November 15 , 2017 at 3:30 . Patient states she was aware.

## 2016-02-19 ENCOUNTER — Encounter: Payer: Self-pay | Admitting: Oncology

## 2016-02-21 ENCOUNTER — Ambulatory Visit
Admission: RE | Admit: 2016-02-21 | Discharge: 2016-02-21 | Disposition: A | Discharge status: Home / Self Care | Payer: BLUE CROSS/BLUE SHIELD | Source: Ambulatory Visit | Attending: Radiation Oncology | Admitting: Radiation Oncology

## 2016-02-21 ENCOUNTER — Encounter: Payer: Self-pay | Admitting: Radiation Oncology

## 2016-02-21 ENCOUNTER — Other Ambulatory Visit: Payer: BLUE CROSS/BLUE SHIELD

## 2016-02-21 ENCOUNTER — Encounter: Payer: Self-pay | Admitting: Gynecologic Oncology

## 2016-02-21 ENCOUNTER — Ambulatory Visit (HOSPITAL_BASED_OUTPATIENT_CLINIC_OR_DEPARTMENT_OTHER): Payer: BLUE CROSS/BLUE SHIELD | Admitting: Gynecologic Oncology

## 2016-02-21 VITALS — BP 111/71 | HR 70 | Temp 97.9°F | Resp 18 | Ht 61.0 in | Wt 158.4 lb

## 2016-02-21 VITALS — BP 109/66 | HR 71 | Temp 98.3°F | Ht 61.0 in | Wt 159.0 lb

## 2016-02-21 DIAGNOSIS — C7982 Secondary malignant neoplasm of genital organs: Secondary | ICD-10-CM

## 2016-02-21 DIAGNOSIS — C541 Malignant neoplasm of endometrium: Secondary | ICD-10-CM | POA: Insufficient documentation

## 2016-02-21 DIAGNOSIS — Z9221 Personal history of antineoplastic chemotherapy: Secondary | ICD-10-CM | POA: Diagnosis not present

## 2016-02-21 DIAGNOSIS — Z87891 Personal history of nicotine dependence: Secondary | ICD-10-CM | POA: Insufficient documentation

## 2016-02-21 DIAGNOSIS — Z923 Personal history of irradiation: Secondary | ICD-10-CM | POA: Insufficient documentation

## 2016-02-21 DIAGNOSIS — Z79899 Other long term (current) drug therapy: Secondary | ICD-10-CM | POA: Diagnosis not present

## 2016-02-21 NOTE — Progress Notes (Signed)
POSTOPERATIVE FOLLOWUP  Assessment:    55 y.o. 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 S/p definitive therapy with chemotherapy and radiation. Complete clinical response Plan: 1) Recommend repeat CT in 3 months to monitor radiographic resolution of right vaginal cuff lesion 2) Recommend follow--up with Dr Sondra Come in 3 months and with me in 6 months.  HPI:  Karina Briggs is a 55 y.o. year old initially seen in consultation on 09/26/14 referred by Dr Nori Riis for grade 1 endometrial cancer.  She then underwent a robotic hysterectomy, BSO, and bilateral sentinel lymph node biopsy on [id number] without complications.  Her postoperative course was uncomplicated.  Her final pathologic diagnosis is a Stage IA Grade 1 endometrioid endometrial cancer with no lymphovascular space invasion, 5/28 mm (25%) of myometrial invasion and negative lymph nodes.  She had a history of postcoital spotting since March, 2017. She saw Dr Nori Riis for routine surveillance exam in may, 2017 and a pap smear was taken which showed endometrial cells and a second showed endometrial cells. As response to this a colposcopy was performed and revealed a nodule at the right vaginal cuff which was biopsied on 10/02/15 which confirmed adenocarcinoma similar to her previous endometrial cancer.   Interval Hx: Imaging confirmed that the recurrence was localized to the vaginal cuff with extension into the deeper paracolpos tissues on the right (2.3cm).  She completed external beam radiation with 45Gy in 25 fractions to the pelvis with a vaginal boost of 5.4 Gy n 3 fractions between 11/02/15 and 12/12/15, with 5 cycles of radiosensitizing CDDP. On 12/28/15 she commenced vaginal cuff boost with HDR therapy (6Gy x 2).  She has done well with no enduring toxicities beyond therapy.  CT imaging on 02/13/16 (post- therapy) showed no new metastases or progression.  There is a 1.6x2.1cm unenhanced asymmetric tissue density to the right of the vaginal cuff which is decreased in size from pretreatment dimensions (2.0x2.3).   Past Medical History:  Diagnosis Date  . Anemia    hx of with pregnancy  . Dizziness - light-headed 02/2010   treated by ENT and neurology  . endometrial ca dx'd 10/2015   endometrial  . Headache    occasionally  . Heart murmur    hx of with pregnancy  . History of radiation therapy 11/02/2015-12/12/2015   external beam to pelvis   Past Surgical History:  Procedure Laterality Date  . DILATION AND CURETTAGE, DIAGNOSTIC / THERAPEUTIC  1990  . ROBOTIC ASSISTED TOTAL HYSTERECTOMY WITH BILATERAL SALPINGO OOPHERECTOMY Bilateral 11/01/2014   Procedure: ROBOTIC ASSISTED TOTAL LAPARSCOPIC  HYSTERECTOMY WITH BILATERAL SALPINGO [place] NODE MAPPING, LYMPHADENECTOMY;  Surgeon: [name], MD;  Location: [facility];  Service: Gynecology;  Laterality: Bilateral;  . TONSILLECTOMY     Family History  Problem Relation Age of Onset  . Hypertension Mother   . Arthritis Mother   . Alcohol abuse Neg Hx   . Cancer Neg Hx   . COPD Neg Hx   . Depression Neg Hx   . Diabetes Neg Hx   . Early death Neg Hx   . Heart disease Neg Hx   . Hyperlipidemia Neg Hx   . Stroke Neg Hx    Social History   Social History  . Marital status: Married    Spouse name: N/A  . Number of children: 2  . Years of education: N/A   Occupational History  . bookkeeper  Social History Main Topics  . Smoking status: Former Smoker    Packs/day: 0.25    Years: 30.00    Types: Cigarettes    Quit date: 10/27/2015  . Smokeless tobacco: Never Used  . Alcohol use No  . Drug use: No  . Sexual activity: Not Currently    Birth control/ protection: Post-menopausal   Other Topics Concern  . Not on file   Social History Narrative  . No narrative on file   Current Outpatient Prescriptions on File Prior to Visit  Medication Sig Dispense Refill  .  pantoprazole (PROTONIX) 40 MG tablet Take 1 tablet (40 mg total) by mouth daily. 30 tablet 2  . polyethylene glycol (MIRALAX / GLYCOLAX) packet Take 17 g by mouth 2 (two) times daily as needed.     No current facility-administered medications on file prior to visit.    Allergies  Allergen Reactions  . Penicillins     Childhood reaction     Review of systems: Constitutional:  She has no weight gain or weight loss. She has no fever or chills. Eyes: No blurred vision Ears, Nose, Mouth, Throat: No dizziness, headaches or changes in hearing. No mouth sores. Cardiovascular: No chest pain, palpitations or edema. Respiratory:  No shortness of breath, wheezing or cough Gastrointestinal: She has normal bowel movements without diarrhea or constipation. She denies any nausea or vomiting. She denies blood in her stool or heart burn. Genitourinary:  She denies pelvic pain, pelvic pressure or changes in her urinary function. She has no hematuria, dysuria, or incontinence. + vaginal spotting Musculoskeletal: Denies muscle weakness or joint pains.  Skin:  She has no skin changes, rashes or itching Neurological:  Denies dizziness or headaches. No neuropathy, no numbness or tingling. Psychiatric:  She denies depression or anxiety. Hematologic/Lymphatic:   No easy bruising or bleeding   Physical Exam: Blood pressure 111/71, pulse 70, temperature 97.9 F (36.6 C), temperature source Oral, resp. rate 18, height 5\' 1"  (1.549 m), weight 158 lb 6.4 oz (71.8 kg), last menstrual period 08/01/2011, SpO2 100 %. General: Well dressed, well nourished in no apparent distress.   HEENT:  Normocephalic and atraumatic, no lesions.  Extraocular muscles intact. Sclerae anicteric. Pupils equal, round, reactive. No mouth sores or ulcers. Thyroid is normal size, not nodular, midline. Skin:  No lesions or rashes. Breasts:  deferred Lungs:  deferred Cardiovascular: deferred Abdomen:  Soft, nontender, nondistended.  No  palpable masses.  No hepatosplenomegaly.  No ascites. Normal bowel sounds.  No hernias.  Incisions are soft and healed. Genitourinary: Normal EGBUS  Vaginal cuff is smooth with no palpable or visible lesions. There ware no deeper masses appreciated.  Extremities: No cyanosis, clubbing or edema.  No calf tenderness or erythema. No palpable cords. Psychiatric: Mood and affect are appropriate. Neurological: Awake, alert and oriented x 3. Sensation is intact, no neuropathy.  Musculoskeletal: No pain, normal strength and range of motion.  Donaciano Eva, MD

## 2016-02-21 NOTE — Patient Instructions (Signed)
Plan to have a CT scan of the abdomen and pelvis in three months prior to your appt with Dr. Sondra Come and plan to see Dr. Denman George in May 2018 or sooner if needed.  Please call our office for any questions or concerns.

## 2016-02-21 NOTE — Progress Notes (Addendum)
  Home Care Instructions for the Insertion and Care of Your Vaginal Dilator  Why Do I Need a Vaginal Dilator?  Internal radiation therapy may cause scar tissue to form at the top of your vagina (vaginal cuff).  This may make vaginal examinations difficult in the future. You can prevent scar tissue from forming by using a vaginal dilator (a smooth plastic rod), and/or by having regular sexual intercourse.  If not using the dilator you should be having intercourse two or three times a week.  If you are unable to have intercourse, you should use your vaginal dilator.  You may have some spotting or bleeding from your dilator or intercourse the first few times. You may also have some discomfort. If discomfort occurs with intercourse, you and your partner may need to stop for a while and try again later.  How to Use Your Vaginal Dilator  - Wash the dilator with soap and water before and after each use. - Check the dilator to be sure it is smooth. Do not use the dilator if you find any roughspots. - Coat the dilator with K-Y Jelly, Astroglide, or Replens. Do not use Vaseline, baby oil, or other oil based lubricants. They are not water-soluble and can be irritating to the tissues in the vagina. - Lie on your back with your knees bent and legs apart. - Insert the rounded end of the dilator into your vagina as far as it will go without causing pain or discomfort. - Close your knees and slowly straighten your legs. - Keep the dilator in your vagina for about 10 to 15 minutes. [facility] your knees, open your legs, and gently remove the dilator. - Gently cleanse the skin around the vaginal opening. - Wash the dilator after each use. - Please use the dilator 3 times a week for 10 minutes at a time. -  It is important that you use the dilator routinely until instructed otherwise by your doctor.

## 2016-02-21 NOTE — Progress Notes (Signed)
Karina Briggs is here for follow up.  She denies having any pain, bladder/bowel issues or vaginal/rectal bleeding.  She reports her energy level is improving.  BP 109/66 (BP Location: Right Arm, Patient Position: Sitting)   Pulse 71   Temp 98.3 F (36.8 C) (Oral)   Ht 5\' 1"  (1.549 m)   Wt 159 lb (72.1 kg)   LMP 08/01/2011   SpO2 100%   BMI 30.04 kg/m    Wt Readings from Last 3 Encounters:  02/21/16 159 lb (72.1 kg)  01/19/16 157 lb (71.2 kg)  01/04/16 157 lb 1.6 oz (71.3 kg)

## 2016-02-21 NOTE — Progress Notes (Signed)
Radiation Oncology         [phone] ________________________________  Name: Karina Briggs MRN: [id number]  Date: 02/21/2016  DOB: 06-08-1960  Follow-Up Visit Note  CC: No primary care provider on file.  [name], MD    ICD-9-CM ICD-10-CM   1. 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 Endometrial cancer, grade I (HCC) 182.0 C54.1   3. Recurrent carcinoma of endometrium (HCC) 182.0 C54.1     Diagnosis:   Stage IA Grade 1 endometrioid endometrial cancer with no lymphovascular space invasion, 5/28 mm (25%) of myometrial invasion and negative lymph nodes, now with vaginal cuff recurrenceapproximately 1 year out from surgery.  Interval Since Last Radiation:  1 month 12/28/15-01/19/16 6 Gy in 3 fractions of HDR brachytherapy, using Capri applicator, pushing dose into right paravaginal lesion External beam: IMRT 45 Gy to Pelvis: 5.4 Gy boost to right paravaginal mass    Narrative:  The patient returns today for routine follow-up.  She denies pain, bladder/bowel issues, or vaginal/rectal bleeding. She states improving energy level.                           ALLERGIES:  is allergic to penicillins.  Meds: Current Outpatient Prescriptions  Medication Sig Dispense Refill  . loratadine (CLARITIN) 10 MG tablet Take 10 mg by mouth daily.    . pantoprazole (PROTONIX) 40 MG tablet Take 1 tablet (40 mg total) by mouth daily. 30 tablet 2  . famotidine (PEPCID) 20 MG tablet Take 20 mg by mouth 2 (two) times daily.    . ondansetron (ZOFRAN) 8 MG tablet Take 1 tablet (8 mg total) by mouth every 8 (eight) hours as needed for nausea or vomiting. Will not make drowsy. (Patient not taking: Reported on 02/21/2016) 30 tablet 0  . polyethylene glycol (MIRALAX / GLYCOLAX) packet Take 17 g by mouth 2 (two) times daily as needed.    . prochlorperazine (COMPAZINE) 10 MG tablet Take 1 tablet (10 mg total) by mouth every 6 (six) hours as needed for nausea or vomiting. May make  drowsy. (Patient not taking: Reported on 02/21/2016) 20 tablet 0   No current facility-administered medications for this encounter.     Physical Findings: The patient is in no acute distress. Patient is alert and oriented.  height is 5\' 1"  (1.549 m) and weight is 159 lb (72.1 kg). Her oral temperature is 98.3 F (36.8 C). Her blood pressure is 109/66 and her pulse is 71. Her oxygen saturation is 100%. .  No significant changes. Lungs are clear to auscultation bilaterally. Heart has regular rate and rhythm. No palpable cervical, supraclavicular, or axillary adenopathy. Abdomen soft, non-tender, normal bowel sounds.  Lab Findings: Lab Results  Component Value Date   WBC 5.5 02/12/2016   HGB 11.8 02/12/2016   HCT 35.7 02/12/2016   MCV 99.2 02/12/2016   PLT 228 02/12/2016    Radiographic Findings: Ct Abdomen Pelvis W Contrast  Result Date: 02/13/2016 CLINICAL DATA:  Followup recurrent endometrial carcinoma. Recently completed chemotherapy and radiation therapy. Restaging. EXAM: CT ABDOMEN AND PELVIS WITH CONTRAST TECHNIQUE: Multidetector CT imaging of the abdomen and pelvis was performed using the standard protocol following bolus administration of intravenous contrast. CONTRAST:  140mL ISOVUE-300 IOPAMIDOL (ISOVUE-300) INJECTION 61% COMPARISON:  10/18/2015 FINDINGS: Lower Chest: No acute findings. Hepatobiliary: 1.4 cm benign hemangioma in segment 7 of the right hepatic lobe remains stable. No suspicious liver masses identified. Gallbladder is unremarkable. Pancreas:  No mass or inflammatory changes. Spleen: Within normal limits in size and appearance. Adrenals/Urinary Tract: No masses identified. No evidence of hydronephrosis. Stomach/Bowel: No evidence of obstruction, inflammatory process or abnormal fluid collections. Vascular/Lymphatic: No pathologically enlarged lymph nodes. Stable sub-cm left paraaortic retroperitoneal and mesenteric lymph nodes, which are not pathologically enlarged. No  abdominal aortic aneurysm. Reproductive: Previous hysterectomy. Asymmetric unenhanced soft tissue density is again seen involving the right vaginal cuff, which currently measures 1.6 x 2.1 cm on image 74/2 compared to 2.0 x 2.3 cm previously. No adnexal mass or free fluid identified. Other:  None. Musculoskeletal:  No suspicious bone lesions identified. IMPRESSION: Mild decrease in size of asymmetric soft tissue density involving the right vaginal cuff. No new or progressive disease within the abdomen or pelvis. Electronically Signed   By: Earle Gell M.D.   On: 02/13/2016 11:59    Impression:  The patient is recovering from the effects of radiation. Patient is doing well since her treatment. Minimal residual fatigue.  Plan:  Follow-up in radiation oncology in 3 months. Patient will see Dr. Denman George this afternoon for exam. Patient was given a vaginal dilator and instructions on it's use.  ____________________________________  This document serves as a record of services personally performed by Gery Pray, MD. It was created on his behalf by Bethann Humble, a trained medical scribe. The creation of this record is based on the scribe's personal observations and the provider's statements to them. This document has been checked and approved by the attending provider.

## 2016-03-06 ENCOUNTER — Other Ambulatory Visit: Payer: Self-pay | Admitting: Oncology

## 2016-03-07 ENCOUNTER — Encounter: Payer: Self-pay | Admitting: Radiation Oncology

## 2016-03-07 ENCOUNTER — Ambulatory Visit: Payer: Self-pay | Admitting: Radiation Oncology

## 2016-03-11 NOTE — Progress Notes (Signed)
  Radiation Oncology         [phone] ________________________________  Name: Karina Briggs MRN: [id number]  Date: 01/19/2016  DOB: 12-10-1960  End of Treatment Note  Diagnosis:  Stage IA Grade 1 endometrioid endometrial cancer with no lymphovascular space invasion, 5/28 mm (25%) of myometrial invasion and negative lymph nodes, now with vaginal cuff recurrence  Indication for treatment:  Definitive treatment for recurrence,  along with radiosensitizing chemotherapy     Radiation treatment dates:   11/02/15 - 12/12/15: External Beam Radiation 12/28/15, 01/04/16, 01/19/16: HDR-Vaginal Brachytherapy  Site/dose:    1) Pelvis: 45 Gy in 25 fractions 2) Pelvic Vaginal Boost: 5.4 Gy in 3 fractions 3) HDR-Vaginal Brachytherapy: 18 Gy in 3 fractions. 6 Gy to the [facility] for each fraction.  Beams/energy:    1) IMRT //6X Photon 2) 3D // 6X Photon 3) Idirium 192 as the HDR source. Capri applicator was used.  Narrative: The patient tolerated radiation treatment relatively well. The patient complained of loose stools, nausea, fatigue, nocturia, and headaches during treatment. Tumor responded to treatment per pelvic exams during brachytherapy.  Plan: The patient has completed radiation treatment. The patient will return to radiation oncology clinic for routine followup in one month. I advised them to call or return sooner if they have any questions or concerns related to their recovery or treatment.  -----------------------------------  Blair Promise, PhD, MD  This document serves as a record of services personally performed by Gery Pray, MD. It was created on his behalf by Darcus Austin, a trained medical scribe. The creation of this record is based on the scribe's personal observations and the provider's statements to them. This document has been checked and approved by the attending provider.

## 2016-05-23 ENCOUNTER — Ambulatory Visit (HOSPITAL_COMMUNITY)
Admission: RE | Admit: 2016-05-23 | Discharge: 2016-05-23 | Disposition: A | Discharge status: Home / Self Care | Payer: BLUE CROSS/BLUE SHIELD | Source: Ambulatory Visit | Attending: Gynecologic Oncology | Admitting: Gynecologic Oncology

## 2016-05-23 ENCOUNTER — Encounter (HOSPITAL_COMMUNITY): Payer: Self-pay

## 2016-05-23 DIAGNOSIS — K449 Diaphragmatic hernia without obstruction or gangrene: Secondary | ICD-10-CM | POA: Diagnosis not present

## 2016-05-23 DIAGNOSIS — C7982 Secondary malignant neoplasm of genital organs: Secondary | ICD-10-CM | POA: Insufficient documentation

## 2016-05-23 DIAGNOSIS — C541 Malignant neoplasm of endometrium: Secondary | ICD-10-CM | POA: Insufficient documentation

## 2016-05-23 MED ORDER — SODIUM CHLORIDE 0.9 % IJ SOLN
INTRAMUSCULAR | Status: AC
Start: 2016-05-23 — End: 2016-05-23
  Filled 2016-05-23: qty 50

## 2016-05-23 MED ORDER — IOPAMIDOL (ISOVUE-300) INJECTION 61%
100.0000 mL | Freq: Once | INTRAVENOUS | Status: AC | PRN
  Administered 2016-05-23: 100 mL via INTRAVENOUS

## 2016-05-23 MED ORDER — IOPAMIDOL (ISOVUE-300) INJECTION 61%
INTRAVENOUS | Status: AC
  Filled 2016-05-23: qty 100

## 2016-05-30 ENCOUNTER — Ambulatory Visit: Payer: BLUE CROSS/BLUE SHIELD | Admitting: Radiation Oncology

## 2016-06-24 ENCOUNTER — Ambulatory Visit
Admission: RE | Admit: 2016-06-24 | Discharge: 2016-06-24 | Disposition: A | Discharge status: Home / Self Care | Payer: BLUE CROSS/BLUE SHIELD | Source: Ambulatory Visit | Attending: Radiation Oncology | Admitting: Radiation Oncology

## 2016-06-24 ENCOUNTER — Encounter: Payer: Self-pay | Admitting: Radiation Oncology

## 2016-06-24 VITALS — BP 112/83 | HR 72 | Temp 98.0°F | Ht 61.0 in | Wt 164.2 lb

## 2016-06-24 DIAGNOSIS — Z923 Personal history of irradiation: Secondary | ICD-10-CM | POA: Insufficient documentation

## 2016-06-24 DIAGNOSIS — Z79899 Other long term (current) drug therapy: Secondary | ICD-10-CM | POA: Insufficient documentation

## 2016-06-24 DIAGNOSIS — Y842 Radiological procedure and radiotherapy as the cause of abnormal reaction of the patient, or of later complication, without mention of misadventure at the time of the procedure: Secondary | ICD-10-CM | POA: Diagnosis not present

## 2016-06-24 DIAGNOSIS — C541 Malignant neoplasm of endometrium: Secondary | ICD-10-CM | POA: Insufficient documentation

## 2016-06-24 DIAGNOSIS — C7982 Secondary malignant neoplasm of genital organs: Secondary | ICD-10-CM | POA: Insufficient documentation

## 2016-06-24 DIAGNOSIS — Z880 Allergy status to penicillin: Secondary | ICD-10-CM | POA: Insufficient documentation

## 2016-06-24 DIAGNOSIS — Z08 Encounter for follow-up examination after completed treatment for malignant neoplasm: Secondary | ICD-10-CM | POA: Diagnosis not present

## 2016-06-24 DIAGNOSIS — N898 Other specified noninflammatory disorders of vagina: Secondary | ICD-10-CM | POA: Diagnosis not present

## 2016-06-24 DIAGNOSIS — Z8542 Personal history of malignant neoplasm of other parts of uterus: Secondary | ICD-10-CM | POA: Diagnosis not present

## 2016-06-24 NOTE — Progress Notes (Signed)
Radiation Oncology         [phone] ________________________________  Name: Karina Briggs MRN: [id number]  Date: 06/24/2016  DOB: 05-01-1960  Follow-Up Visit Note  CC: No primary care provider on file.  [name], MD    ICD-9-CM ICD-10-CM   1. Endometrial cancer, grade I (HCC) 182.0 C54.1   2. Secondary malignant neoplasm of vagina (HCC) 198.82 C79.82 NM PET Image Initial (PI) Skull Base To Thigh  3. Recurrent carcinoma of endometrium (HCC) 182.0 C54.1     Diagnosis:   Stage IA Grade 1 endometrioid endometrial cancer with no lymphovascular space invasion, 5/28 mm (25%) of myometrial invasion and negative lymph nodes,  vaginal cuff recurrenceapproximately 1 year out from surgery.  Interval Since Last Radiation:  5 months 12/28/15-01/19/16 6 Gy in 3 fractions of HDR brachytherapy, using Capri applicator, pushing dose into right paravaginal lesion External beam: IMRT 45 Gy to Pelvis: 5.4 Gy boost to right paravaginal mass  Narrative:  The patient returns today for routine follow-up and to review her most recent CT scan.  She denies pain, bladder/bowel issues, or vaginal/rectal bleeding. She reports feeling fatigued.  She is not using her vaginal dilator but is sexually active with her husband. There are no reports of post coital bleeding.                      ALLERGIES:  is allergic to penicillins.  Meds: Current Outpatient Prescriptions  Medication Sig Dispense Refill  . pantoprazole (PROTONIX) 40 MG tablet TAKE 1 TABLET BY MOUTH EVERY DAY 30 tablet 1  . loratadine (CLARITIN) 10 MG tablet Take 10 mg by mouth daily.    . polyethylene glycol (MIRALAX / GLYCOLAX) packet Take 17 g by mouth 2 (two) times daily as needed.     No current facility-administered medications for this encounter.     Physical Findings: The patient is in no acute distress. Patient is alert and oriented.  height is 5\' 1"  (1.549 m) and weight is 164 lb 3.2 oz (74.5 kg). Her oral temperature is 98 F  (36.7 C). Her blood pressure is 112/83 and her pulse is 72. Her oxygen saturation is 99%. .  No significant changes. Lungs are clear to auscultation bilaterally. Heart has regular rate and rhythm. No palpable cervical, supraclavicular, or axillary adenopathy. Abdomen soft, non-tender, normal bowel sounds.  On pelvic examination the external genitalia were unremarkable. A speculum exam was performed. There are no mucosal lesions noted in the vaginal vault. On bimanual and rectovaginal examination there were no pelvic masses appreciated. Special detail was paid to the right pelvis and there were no palpable masses in this area.   Lab Findings: Lab Results  Component Value Date   WBC 5.5 02/12/2016   HGB 11.8 02/12/2016   HCT 35.7 02/12/2016   MCV 99.2 02/12/2016   PLT 228 02/12/2016    Radiographic Findings: No results found.  Impression:  The patient is recovering from the effects of radiation. There was no evidence of recurrence on clinical exam today. Recent CT scan shows right perivaginal lesion to be smaller. Discussed with the patient this likely scar tissue. The patient would like to undergo a PET scan if approved by her insurance company to ensure there is no activity there.  Plan:  Follow-up in radiation oncology in August. Patient will see Dr. Denman George on 08/19/16.  ____________________________________  This document serves as a record of services personally performed by Gery Pray, MD. It was created on his  behalf by Bethann Humble, a trained medical scribe. The creation of this record is based on the scribe's personal observations and the provider's statements to them. This document has been checked and approved by the attending provider.

## 2016-06-24 NOTE — Progress Notes (Signed)
Karina Briggs is here for follow up.  She denies having pain, bladder/bowel issues or vaginal bleeding.  She reports feeling fatigue.  She would like to review the results from her last CT scan.  BP 112/83 (BP Location: Right Arm, Patient Position: Sitting)   Pulse 72   Temp 98 F (36.7 C) (Oral)   Ht 5\' 1"  (1.549 m)   Wt 164 lb 3.2 oz (74.5 kg)   LMP 08/01/2011   SpO2 99%   BMI 31.03 kg/m    Wt Readings from Last 3 Encounters:  06/24/16 164 lb 3.2 oz (74.5 kg)  02/21/16 159 lb (72.1 kg)  02/21/16 158 lb 6.4 oz (71.8 kg)

## 2016-06-25 ENCOUNTER — Telehealth: Payer: Self-pay | Admitting: *Deleted

## 2016-06-25 NOTE — Telephone Encounter (Signed)
CALLED PATIENT TO INFORM OF FU APPT. WITH DR. [place] ON 11-28-16 @ 4 PM, LVM FOR A RETURN CALL

## 2016-07-17 ENCOUNTER — Telehealth: Payer: Self-pay | Admitting: *Deleted

## 2016-07-17 NOTE — Telephone Encounter (Signed)
Facsimile request received for pantoprazole refill for this patient.  [employer].  Notified this patient is no longer with [place] with Dr.Livesay as this provider has retired.  Encouraged to send request to PCP.  Patient last seen here on 12-12-2015.  This last assessment/plan read to continue this medicine over next couple months and discuss further with PCP.

## 2016-08-19 ENCOUNTER — Ambulatory Visit: Payer: BLUE CROSS/BLUE SHIELD | Attending: Gynecologic Oncology | Admitting: Gynecologic Oncology

## 2016-08-19 ENCOUNTER — Encounter: Payer: Self-pay | Admitting: Gynecologic Oncology

## 2016-08-19 VITALS — BP 128/68 | HR 60 | Temp 98.2°F | Resp 20 | Wt 164.0 lb

## 2016-08-19 DIAGNOSIS — C541 Malignant neoplasm of endometrium: Secondary | ICD-10-CM | POA: Diagnosis not present

## 2016-08-19 DIAGNOSIS — Z9889 Other specified postprocedural states: Secondary | ICD-10-CM | POA: Diagnosis not present

## 2016-08-19 DIAGNOSIS — Z79899 Other long term (current) drug therapy: Secondary | ICD-10-CM | POA: Diagnosis not present

## 2016-08-19 DIAGNOSIS — Z8542 Personal history of malignant neoplasm of other parts of uterus: Secondary | ICD-10-CM | POA: Diagnosis not present

## 2016-08-19 DIAGNOSIS — Z87891 Personal history of nicotine dependence: Secondary | ICD-10-CM | POA: Diagnosis not present

## 2016-08-19 DIAGNOSIS — Z880 Allergy status to penicillin: Secondary | ICD-10-CM | POA: Insufficient documentation

## 2016-08-19 DIAGNOSIS — C7982 Secondary malignant neoplasm of genital organs: Secondary | ICD-10-CM

## 2016-08-19 NOTE — Patient Instructions (Signed)
Primary Care Providers: Dr. Colin Benton at [facility], Dr. Pricilla Holm at [facility]

## 2016-08-19 NOTE — Progress Notes (Signed)
POSTOPERATIVE FOLLOWUP  Assessment:    56 y.o. 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 S/p definitive therapy with chemotherapy and radiation. Complete clinical response Plan: Recommend follow--up with Dr Sondra Come in 3 months and with me in 6 months.  HPI:  Karina Briggs is a 56 y.o. year old initially seen in consultation on 09/26/14 referred by Dr Nori Riis for grade 1 endometrial cancer.  She then underwent a robotic hysterectomy, BSO, and bilateral sentinel lymph node biopsy on 9/41/74 without complications.  Her postoperative course was uncomplicated.  Her final pathologic diagnosis is a Stage IA Grade 1 endometrioid endometrial cancer with no lymphovascular space invasion, 5/28 mm (25%) of myometrial invasion and negative lymph nodes.  She had a history of postcoital spotting since March, 2017. She saw Dr Nori Riis for routine surveillance exam in may, 2017 and a pap smear was taken which showed endometrial cells and a second showed endometrial cells. As response to this a colposcopy was performed and revealed a nodule at the right vaginal cuff which was biopsied on 10/02/15 which confirmed adenocarcinoma similar to her previous endometrial cancer.   Imaging confirmed that the recurrence was localized to the vaginal cuff with extension into the deeper paracolpos tissues on the right (2.3cm).  She completed external beam radiation with 45Gy in 25 fractions to the pelvis with a vaginal boost of 5.4 Gy n 3 fractions between 11/02/15 and 12/12/15, with 5 cycles of radiosensitizing CDDP. On 12/28/15 she commenced vaginal cuff boost with HDR therapy (6Gy x 2).  She has done well with no enduring toxicities beyond therapy.  CT imaging on 02/13/16 (post- therapy) showed no new metastases or progression. There is a 1.6x2.1cm unenhanced asymmetric tissue density to the right of the vaginal cuff which is decreased in  size from pretreatment dimensions (2.0x2.3).  Repeat CT imaging in February, 2018 showed continued decrease in size of right vaginal cuff mass.  Interval Hx: She is feeling very well with no vaginal bleeding, no pelvic pain, no concerns.   Past Medical History:  Diagnosis Date  . Anemia    hx of with pregnancy  . Dizziness - light-headed 02/2010   treated by ENT and neurology  . endometrial ca dx'd 10/2015   endometrial  . Headache    occasionally  . Heart murmur    hx of with pregnancy  . History of radiation therapy 11/02/2015-12/12/2015   external beam to pelvis   Past Surgical History:  Procedure Laterality Date  . DILATION AND CURETTAGE, DIAGNOSTIC / THERAPEUTIC  1990  . ROBOTIC ASSISTED TOTAL HYSTERECTOMY WITH BILATERAL SALPINGO OOPHERECTOMY Bilateral 11/01/2014   Procedure: ROBOTIC ASSISTED TOTAL LAPARSCOPIC  HYSTERECTOMY WITH BILATERAL SALPINGO [place] NODE MAPPING, LYMPHADENECTOMY;  Surgeon: [name], MD;  Location: [facility];  Service: Gynecology;  Laterality: Bilateral;  . TONSILLECTOMY     Family History  Problem Relation Age of Onset  . Hypertension Mother   . Arthritis Mother   . Alcohol abuse Neg Hx   . Cancer Neg Hx   . COPD Neg Hx   . Depression Neg Hx   . Diabetes Neg Hx   . Early death Neg Hx   . Heart disease Neg Hx   . Hyperlipidemia Neg Hx   . Stroke Neg Hx    Social History   Social History  . Marital status: Married    Spouse name: N/A  . Number of children: 2  .  Years of education: N/A   Occupational History  . bookkeeper    Social History Main Topics  . Smoking status: Former Smoker    Packs/day: 0.25    Years: 30.00    Types: Cigarettes    Quit date: 10/27/2015  . Smokeless tobacco: Never Used  . Alcohol use No  . Drug use: No  . Sexual activity: Not Currently    Birth control/ protection: Post-menopausal   Other Topics Concern  . Not on file   Social History Narrative  . No narrative on file   Current Outpatient  Prescriptions on File Prior to Visit  Medication Sig Dispense Refill  . loratadine (CLARITIN) 10 MG tablet Take 10 mg by mouth daily.    . pantoprazole (PROTONIX) 40 MG tablet TAKE 1 TABLET BY MOUTH EVERY DAY (Patient taking differently: TAKE 1 TABLET BY MOUTH EVERY 3 days) 30 tablet 1   No current facility-administered medications on file prior to visit.    Allergies  Allergen Reactions  . Penicillins     Childhood reaction     Review of systems: Constitutional:  She has no weight gain or weight loss. She has no fever or chills. Eyes: No blurred vision Ears, Nose, Mouth, Throat: No dizziness, headaches or changes in hearing. No mouth sores. Cardiovascular: No chest pain, palpitations or edema. Respiratory:  No shortness of breath, wheezing or cough Gastrointestinal: She has normal bowel movements without diarrhea or constipation. She denies any nausea or vomiting. She denies blood in her stool or heart burn. Genitourinary:  She denies pelvic pain, pelvic pressure or changes in her urinary function. She has no hematuria, dysuria, or incontinence. No vaginal spotting Musculoskeletal: Denies muscle weakness or joint pains.  Skin:  She has no skin changes, rashes or itching Neurological:  Denies dizziness or headaches. No neuropathy, no numbness or tingling. Psychiatric:  She denies depression or anxiety. Hematologic/Lymphatic:   No easy bruising or bleeding   Physical Exam: Blood pressure 128/68, pulse 60, temperature 98.2 F (36.8 C), temperature source Oral, resp. rate 20, weight 164 lb (74.4 kg), last menstrual period 08/01/2011. General: Well dressed, well nourished in no apparent distress.   HEENT:  Normocephalic and atraumatic, no lesions.  Extraocular muscles intact. Sclerae anicteric. Pupils equal, round, reactive. No mouth sores or ulcers. Thyroid is normal size, not nodular, midline. Skin:  No lesions or rashes. Breasts:  deferred Lungs:  deferred Cardiovascular:  deferred Abdomen:  Soft, nontender, nondistended.  No palpable masses.  No hepatosplenomegaly.  No ascites. Normal bowel sounds.  No hernias.  Incisions are soft and healed. Genitourinary: Normal EGBUS  Vaginal cuff is smooth with no palpable or visible lesions. There ware no deeper masses appreciated.  Extremities: No cyanosis, clubbing or edema.  No calf tenderness or erythema. No palpable cords. Psychiatric: Mood and affect are appropriate. Neurological: Awake, alert and oriented x 3. Sensation is intact, no neuropathy.  Musculoskeletal: No pain, normal strength and range of motion.  Donaciano Eva, MD

## 2016-11-01 ENCOUNTER — Other Ambulatory Visit: Payer: Self-pay

## 2016-11-01 DIAGNOSIS — K219 Gastro-esophageal reflux disease without esophagitis: Secondary | ICD-10-CM

## 2016-11-01 MED ORDER — PANTOPRAZOLE SODIUM 40 MG PO TBEC: 40.0000 mg | DELAYED_RELEASE_TABLET | Freq: Every day | ORAL | 0 refills | Status: DC

## 2016-11-22 DIAGNOSIS — Z1231 Encounter for screening mammogram for malignant neoplasm of breast: Secondary | ICD-10-CM | POA: Diagnosis not present

## 2016-11-25 ENCOUNTER — Ambulatory Visit
Admission: RE | Admit: 2016-11-25 | Discharge: 2016-11-25 | Disposition: A | Discharge status: Home / Self Care | Payer: BLUE CROSS/BLUE SHIELD | Source: Ambulatory Visit | Attending: Radiation Oncology | Admitting: Radiation Oncology

## 2016-11-25 ENCOUNTER — Encounter: Payer: Self-pay | Admitting: Radiation Oncology

## 2016-11-25 VITALS — BP 116/68 | HR 73 | Temp 97.7°F | Resp 18 | Wt 170.0 lb

## 2016-11-25 DIAGNOSIS — C541 Malignant neoplasm of endometrium: Secondary | ICD-10-CM

## 2016-11-25 DIAGNOSIS — C7982 Secondary malignant neoplasm of genital organs: Secondary | ICD-10-CM | POA: Diagnosis not present

## 2016-11-25 DIAGNOSIS — Z08 Encounter for follow-up examination after completed treatment for malignant neoplasm: Secondary | ICD-10-CM | POA: Diagnosis not present

## 2016-11-25 DIAGNOSIS — Z8542 Personal history of malignant neoplasm of other parts of uterus: Secondary | ICD-10-CM | POA: Diagnosis not present

## 2016-11-25 DIAGNOSIS — Z79899 Other long term (current) drug therapy: Secondary | ICD-10-CM | POA: Insufficient documentation

## 2016-11-25 NOTE — Progress Notes (Signed)
Karina Briggs is here today for follow up on endometrioid cancer that completed in 01/19/16.  Patient denies having any pain.  Patient denies having any vaginal discharge, vaginal bleeding, foul odor, denies issues with appetite, denies fatigue.  Patient denies constipation/diarrhea, denies urinary symptoms.  VSS, no complaints.  Vitals:   11/25/16 1600  BP: 116/68  Pulse: 73  Resp: 18  Temp: 97.7 F (36.5 C)  TempSrc: Oral  SpO2: 99%  Weight: 170 lb (77.1 kg)   Wt Readings from Last 3 Encounters:  11/25/16 170 lb (77.1 kg)  08/19/16 164 lb (74.4 kg)  06/24/16 164 lb 3.2 oz (74.5 kg)

## 2016-11-25 NOTE — Progress Notes (Signed)
Radiation Oncology         ([phone]) [phone] ________________________________  Name: Karina Briggs MRN: [id number]  Date: 11/25/2016  DOB: [birth year]  Follow-Up Visit Note  CC: Patient, No Pcp Per  [name], MD    ICD-10-CM   1. Recurrent carcinoma of endometrium ([place]) C54.1   2. Secondary malignant neoplasm of vagina (HCC) C79.82     Diagnosis:  Stage IA Grade 1 endometrioid endometrial cancer with no lymphovascular space invasion, 5/28 mm (25%) of myometrial invasion and negative lymph nodes,  vaginal cuff recurrenceapproximately 1 year out from surgery.  Interval Since Last Radiation:  10 months    Radiation treatment dates:   11/02/15 - 12/12/15: External Beam Radiation 12/28/15, 01/04/16, 01/19/16: HDR-Vaginal Brachytherapy  Site/dose:    1) Pelvis: 45 Gy in 25 fractions 2) Pelvic Vaginal Boost: 5.4 Gy in 3 fractions 3) HDR-Vaginal Brachytherapy: 18 Gy in 3 fractions. 6 Gy to the [facility] for each fraction.  Beams/energy:    1) IMRT //6X Photon 2) 3D // 6X Photon 3) Idirium 192 as the HDR source. Capri applicator was used  Narrative:  The patient returns today for routine follow-up.  She presents today with her husband. She was seen by Dr Denman George 25mo ago, she was deemed to be normal at that time. She denies any radiation related changes. Her energy levels have been good recently. She denies hematuria, rectal bleeding, abdominal pain, loss of appetite, abnormal vaginal bleeding, spotting, or discharge. She recently returned from a trip to [place] in May which she enjoyed.                               ALLERGIES:  is allergic to penicillins.  Meds: Current Outpatient Prescriptions  Medication Sig Dispense Refill  . loratadine (CLARITIN) 10 MG tablet Take 10 mg by mouth daily.    . pantoprazole (PROTONIX) 40 MG tablet Take 1 tablet (40 mg total) by mouth daily. (Patient not taking: Reported on 11/25/2016) 30 tablet 0   No current facility-administered medications for this  encounter.    REVIEW OF SYSTEMS: A 10+ POINT REVIEW OF SYSTEMS WAS OBTAINED including neurology, dermatology, psychiatry, cardiac, respiratory, lymph, extremities, GI, GU, musculoskeletal, constitutional, reproductive, HEENT. All pertinent positives are noted in the HPI. All others are negative.  Physical Findings: The patient is in no acute distress. Patient is alert and oriented.  weight is 170 lb (77.1 kg). Her oral temperature is 97.7 F (36.5 C). Her blood pressure is 116/68 and her pulse is 73. Her respiration is 18 and oxygen saturation is 99%. .  No significant changes. Lungs are clear to auscultation bilaterally. Heart has regular rate and rhythm. No palpable cervical, supraclavicular, or axillary adenopathy. Abdomen soft, non-tender, normal bowel sounds. On pelvic examination the external genitalia were unremarkable. A speculum exam was performed. There are no mucosal lesions noted in the vaginal vault. On bimanual and rectovaginal examination there were no pelvic masses appreciated. Special detail was paid to the right pelvis and there were no palpable masses in this area.  Lab Findings: Lab Results  Component Value Date   WBC 5.5 02/12/2016   HGB 11.8 02/12/2016   HCT 35.7 02/12/2016   MCV 99.2 02/12/2016   PLT 228 02/12/2016    Radiographic Findings: No results found.  Impression:    No evidence of recurrence on clinical exam.   Plan:  Return for f/u in 75mo, will see Dr Denman George in 49mo.  ____________________________________  This document serves as a record of services personally performed by Gery Pray, MD. It was created on his behalf by Reola Mosher, a trained medical scribe. The creation of this record is based on the scribe's personal observations and the provider's statements to them. This document has been checked and approved by the attending provider.  -----------------------------------  Blair Promise, PhD, MD

## 2016-11-28 ENCOUNTER — Ambulatory Visit: Payer: Self-pay | Admitting: Radiation Oncology

## 2016-12-04 ENCOUNTER — Telehealth: Payer: Self-pay | Admitting: *Deleted

## 2016-12-04 NOTE — Telephone Encounter (Signed)
Called patient to inform of Pet Scan for 12-16-16 - arrival time - 1:30 pm @ [facility], pt. to be NPO - 8 hrs. Prior to test, lvm for a return call

## 2016-12-05 ENCOUNTER — Telehealth: Payer: Self-pay | Admitting: *Deleted

## 2016-12-05 NOTE — Telephone Encounter (Signed)
CALLED PATIENT TO INFORM THAT PET SCAN HAS BEEN MOVED TO 12-24-16 - ARRIVAL TIME - 8:30 AM @ [facility], PT. TO BE NPO - 8 HRS. PRIOR TO TEST, SPOKE WITH PATIENT AND SHE IS AWARE OF THIS APPT.

## 2016-12-16 ENCOUNTER — Ambulatory Visit (HOSPITAL_COMMUNITY): Payer: BLUE CROSS/BLUE SHIELD

## 2016-12-17 ENCOUNTER — Encounter: Payer: Self-pay | Admitting: Gynecologic Oncology

## 2016-12-24 ENCOUNTER — Ambulatory Visit (HOSPITAL_COMMUNITY)
Admission: RE | Admit: 2016-12-24 | Discharge: 2016-12-24 | Disposition: A | Discharge status: Home / Self Care | Payer: BLUE CROSS/BLUE SHIELD | Source: Ambulatory Visit | Attending: Radiation Oncology | Admitting: Radiation Oncology

## 2016-12-24 DIAGNOSIS — C7982 Secondary malignant neoplasm of genital organs: Secondary | ICD-10-CM | POA: Diagnosis not present

## 2016-12-24 DIAGNOSIS — C541 Malignant neoplasm of endometrium: Secondary | ICD-10-CM | POA: Diagnosis not present

## 2016-12-24 LAB — GLUCOSE, CAPILLARY: GLUCOSE-CAPILLARY: 93 mg/dL (ref 65–99)

## 2016-12-24 MED ORDER — FLUDEOXYGLUCOSE F - 18 (FDG) INJECTION
8.9000 | Freq: Once | INTRAVENOUS | Status: AC | PRN
  Administered 2016-12-24: 8.9 via INTRAVENOUS

## 2016-12-30 ENCOUNTER — Encounter: Payer: Self-pay | Admitting: Family Medicine

## 2016-12-30 ENCOUNTER — Ambulatory Visit (INDEPENDENT_AMBULATORY_CARE_PROVIDER_SITE_OTHER): Payer: BLUE CROSS/BLUE SHIELD | Admitting: Family Medicine

## 2016-12-30 VITALS — BP 92/50 | HR 71 | Temp 98.1°F | Ht 61.0 in | Wt 173.3 lb

## 2016-12-30 DIAGNOSIS — Z7689 Persons encountering health services in other specified circumstances: Secondary | ICD-10-CM | POA: Diagnosis not present

## 2016-12-30 DIAGNOSIS — C541 Malignant neoplasm of endometrium: Secondary | ICD-10-CM | POA: Diagnosis not present

## 2016-12-30 DIAGNOSIS — E669 Obesity, unspecified: Secondary | ICD-10-CM

## 2016-12-30 DIAGNOSIS — K219 Gastro-esophageal reflux disease without esophagitis: Secondary | ICD-10-CM | POA: Diagnosis not present

## 2016-12-30 DIAGNOSIS — Z23 Encounter for immunization: Secondary | ICD-10-CM

## 2016-12-30 MED ORDER — PANTOPRAZOLE SODIUM 20 MG PO TBEC: 20.0000 mg | DELAYED_RELEASE_TABLET | Freq: Every day | ORAL | 3 refills | Status: DC

## 2016-12-30 NOTE — Progress Notes (Signed)
HPI:  Karina Briggs is here to establish care.  Due for preventive visit, flu shot, tetanus booster.  Has the following chronic problems that require follow up and concerns today:  GERD: -takes protonix, 40 mg as needed several days per week -Denies any history of dysphagia, hiatal hernia, gastritis or esophagitis -has not tried a lower dose PPI  Endometrial Cancer: -seeing oncology (Dr. Denman George) and gyn for management (Dr. Nori Riis) and radiation oncology specialist, Dr. Sondra Come -Status post hysterectomy in 2016, recurrence from vaginal cuff in 2017, status post radiation therapy  Obesity: -Feels is most likely due to her diet, husband is a great cook, but does not cook healthy  ROS negative for unless reported above: fevers, unintentional weight loss, hearing or vision loss, chest pain, palpitations, struggling to breath, hemoptysis, melena, hematochezia, hematuria, falls, loc, si, thoughts of self harm  Past Medical History:  Diagnosis Date  . Anemia    hx of with pregnancy  . Dizziness - light-headed 02/2010   treated by ENT and neurology  . endometrial ca dx'd 10/2015   endometrial  . GERD (gastroesophageal reflux disease)   . Headache    occasionally  . Heart murmur    hx of with pregnancy  . History of radiation therapy 11/02/2015-12/12/2015   external beam to pelvis  . Smoking hx    quit in 2016, smoked on and off for 20 years, light smoker    Past Surgical History:  Procedure Laterality Date  . DILATION AND CURETTAGE, DIAGNOSTIC / THERAPEUTIC  1990  . ROBOTIC ASSISTED TOTAL HYSTERECTOMY WITH BILATERAL SALPINGO OOPHERECTOMY Bilateral 11/01/2014   Procedure: ROBOTIC ASSISTED TOTAL LAPARSCOPIC  HYSTERECTOMY WITH BILATERAL SALPINGO [place] NODE MAPPING, LYMPHADENECTOMY;  Surgeon: [name], MD;  Location: [facility];  Service: Gynecology;  Laterality: Bilateral;  . TONSILLECTOMY      Family History  Problem Relation Age of Onset  . Hypertension Mother   .  Arthritis Mother   . Alcohol abuse Neg Hx   . Cancer Neg Hx   . COPD Neg Hx   . Depression Neg Hx   . Diabetes Neg Hx   . Early death Neg Hx   . Heart disease Neg Hx   . Hyperlipidemia Neg Hx   . Stroke Neg Hx     Social History   Social History  . Marital status: Married    Spouse name: N/A  . Number of children: 2  . Years of education: N/A   Occupational History  . bookkeeper    Social History Main Topics  . Smoking status: Former Smoker    Packs/day: 0.25    Years: 30.00    Types: Cigarettes    Quit date: 10/27/2015  . Smokeless tobacco: Never Used  . Alcohol use No  . Drug use: No  . Sexual activity: Not Currently    Birth control/ protection: Post-menopausal   Other Topics Concern  . 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  loratadine (CLARITIN) 10 MG tablet, Take 10 mg by mouth daily., Disp: , Rfl:  .  pantoprazole (PROTONIX) 20 MG tablet, Take 1 tablet (20 mg total) by mouth daily., Disp: 30 tablet, Rfl: 3  EXAM:  Vitals:   12/30/16 1325  BP: (!) 92/50  Pulse: 71  Temp: 98.1 F (36.7 C)    Body mass index is 32.74 kg/m.  GENERAL: vitals reviewed and listed above, alert, oriented, appears well hydrated and in no acute distress  HEENT: atraumatic, conjunttiva clear, no obvious abnormalities on inspection of external nose and ears  NECK: no obvious masses on inspection  LUNGS: clear to auscultation bilaterally, no wheezes, rales or rhonchi, good air movement  CV: HRRR, no peripheral edema  MS: moves all extremities without noticeable abnormality  PSYCH: pleasant and cooperative, no obvious depression or anxiety  ASSESSMENT AND PLAN:  Discussed the following assessment  and plan:  1. Gastroesophageal reflux disease without esophagitis -Try a lower dose PPI as needed after discussion of risks benefits -Lifestyle recommendations - pantoprazole (PROTONIX) 20 MG tablet; Take 1 tablet (20 mg total) by mouth daily.  Dispense: 30 tablet; Refill: 3  2. Obesity without serious comorbidity, unspecified classification, unspecified obesity type -Discussed a healthy diet at length, feel the Mediterranean way of eating is the healthiest and would most likely contributing to a healthier metabolic profile, and liver cancer meats and overall health along with likely weight reduction -Also advised regular exercise -Discussed other treatment options for obesity  3. Recurrent carcinoma of endometrium ([place]) -managed by her specialists  4. Encounter to establish care with new doctor -We reviewed the PMH, PSH, FH, SH, Meds and Allergies. -We provided refills for any medications we will prescribe as needed. -We addressed current concerns per orders and patient instructions. -We have asked for records for pertinent exams, studies, vaccines and notes from previous providers. -We have advised patient to follow up per instructions below.   -Patient advised to return or notify a doctor immediately if symptoms worsen or persist or new concerns arise.  Patient Instructions  BEFORE YOU LEAVE: -flu shot and tetanus booster -follow up: physical each year at your convenience  Decrease your protonix to 20mg  to see if this works  We recommend the following healthy lifestyle for LIFE: 1) Small portions. But regular healthy meals and snacks.  2) Eat a healthy clean diet.   TRY TO EAT: -at least 5-7 servings of low sugar vegetables per day (not corn, potatoes or bananas.) -Add fresh herbs, lemon, garlic and spices to your food for flavor and for health -berries are the best choice if you wish to eat fruit.   -lean meets (fish, chicken or [place] breasts) -vegan proteins for some  meals - beans or tofu, whole grains, nuts and seeds -Replace bad fats with good fats - good fats include: fish, nuts and seeds, canola oil, olive oil -small amounts of low fat or non fat dairy -small amounts of100 % whole grains - check the lables  AVOID: -SUGAR, sweets, anything with added sugar, corn syrup or sweeteners -if you must have a sweetener, small amounts of stevia may be best -sweetened beverages -simple starches (rice, bread, potatoes, pasta, chips, etc - small amounts of 100% whole grains are ok) -red meat, pork, butter -fried foods, fast food, processed food, excessive dairy, eggs and coconut.  3)Get at least 150 minutes of sweaty aerobic exercise per week.  4)Reduce stress - consider counseling, meditation and relaxation to balance other aspects of your life.      Colin Benton R.

## 2016-12-30 NOTE — Patient Instructions (Addendum)
BEFORE YOU LEAVE: -flu shot and tetanus booster -follow up: physical each year at your convenience  Decrease your protonix to 20mg  to see if this works  We recommend the following healthy lifestyle for LIFE: 1) Small portions. But regular healthy meals and snacks.  2) Eat a healthy clean diet.   TRY TO EAT: -at least 5-7 servings of low sugar vegetables per day (not corn, potatoes or bananas.) -Add fresh herbs, lemon, garlic and spices to your food for flavor and for health -berries are the best choice if you wish to eat fruit.   -lean meets (fish, chicken or [place] breasts) -vegan proteins for some meals - beans or tofu, whole grains, nuts and seeds -Replace bad fats with good fats - good fats include: fish, nuts and seeds, canola oil, olive oil -small amounts of low fat or non fat dairy -small amounts of100 % whole grains - check the lables  AVOID: -SUGAR, sweets, anything with added sugar, corn syrup or sweeteners -if you must have a sweetener, small amounts of stevia may be best -sweetened beverages -simple starches (rice, bread, potatoes, pasta, chips, etc - small amounts of 100% whole grains are ok) -red meat, pork, butter -fried foods, fast food, processed food, excessive dairy, eggs and coconut.  3)Get at least 150 minutes of sweaty aerobic exercise per week.  4)Reduce stress - consider counseling, meditation and relaxation to balance other aspects of your life.

## 2016-12-30 NOTE — Addendum Note (Signed)
Addended by: Agnes Lawrence on: 12/30/2016 02:40 PM   Modules accepted: Orders

## 2017-02-19 ENCOUNTER — Ambulatory Visit: Payer: BLUE CROSS/BLUE SHIELD | Attending: Gynecologic Oncology | Admitting: Gynecologic Oncology

## 2017-02-19 ENCOUNTER — Encounter: Payer: Self-pay | Admitting: Gynecologic Oncology

## 2017-02-19 VITALS — BP 120/86 | HR 67 | Temp 98.1°F | Resp 18 | Ht 61.0 in | Wt 174.1 lb

## 2017-02-19 DIAGNOSIS — K219 Gastro-esophageal reflux disease without esophagitis: Secondary | ICD-10-CM | POA: Insufficient documentation

## 2017-02-19 DIAGNOSIS — C541 Malignant neoplasm of endometrium: Secondary | ICD-10-CM

## 2017-02-19 DIAGNOSIS — Z08 Encounter for follow-up examination after completed treatment for malignant neoplasm: Secondary | ICD-10-CM | POA: Insufficient documentation

## 2017-02-19 DIAGNOSIS — Z9221 Personal history of antineoplastic chemotherapy: Secondary | ICD-10-CM | POA: Diagnosis not present

## 2017-02-19 DIAGNOSIS — Z923 Personal history of irradiation: Secondary | ICD-10-CM | POA: Diagnosis not present

## 2017-02-19 DIAGNOSIS — Z880 Allergy status to penicillin: Secondary | ICD-10-CM | POA: Insufficient documentation

## 2017-02-19 DIAGNOSIS — Z87891 Personal history of nicotine dependence: Secondary | ICD-10-CM | POA: Insufficient documentation

## 2017-02-19 DIAGNOSIS — Z8542 Personal history of malignant neoplasm of other parts of uterus: Secondary | ICD-10-CM

## 2017-02-19 NOTE — Progress Notes (Signed)
GYN Karina Briggs  Assessment:    56 y.o. 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 S/p definitive therapy with chemotherapy and radiation. Complete clinical response Plan: Recommend follow--up with me in 3 months.  HPI:  Karina Briggs is a 55 y.o. year old initially seen in consultation on 09/26/14 referred by Dr Karina Briggs for grade 1 endometrial cancer.  She then underwent a robotic hysterectomy, BSO, and bilateral sentinel lymph node biopsy on 6/76/19 without complications.  Her postoperative course was uncomplicated.  Her final pathologic diagnosis is a Stage IA Grade 1 endometrioid endometrial cancer with no lymphovascular space invasion, 5/28 mm (25%) of myometrial invasion and negative lymph nodes.  She had a history of postcoital spotting since March, 2017. She saw Dr Karina Briggs for routine surveillance exam in may, 2017 and a pap smear was taken which showed endometrial cells and a second showed endometrial cells. As response to this a colposcopy was performed and revealed a nodule at the right vaginal cuff which was biopsied on 10/02/15 which confirmed adenocarcinoma similar to her previous endometrial cancer.   Imaging confirmed that the recurrence was localized to the vaginal cuff with extension into the deeper paracolpos tissues on the right (2.3cm).  She completed external beam radiation with 45Gy in 25 fractions to the pelvis with a vaginal boost of 5.4 Gy n 3 fractions between 11/02/15 and 12/12/15, with 5 cycles of radiosensitizing CDDP. On 12/28/15 she commenced vaginal cuff boost with HDR therapy (6Gy x 2).  She has done well with no enduring toxicities beyond therapy.  CT imaging on 02/13/16 (post- therapy) showed no new metastases or progression. There is a 1.6x2.1cm unenhanced asymmetric tissue density to the right of the vaginal cuff which is decreased in size from pretreatment dimensions  (2.0x2.3).  Repeat CT imaging in February, 2018 showed continued decrease in size of right vaginal cuff mass.  Interval Hx: PET/CT on 12/24/16 showed no residual soft tissue abnormalities and no hypermetabolic areas. She is feeling very well with no vaginal bleeding, no pelvic pain, no concerns.   Past Medical History:  Diagnosis Date  . Anemia    hx of with pregnancy  . Dizziness - light-headed 02/2010   treated by ENT and neurology  . endometrial ca dx'd 10/2015   endometrial  . GERD (gastroesophageal reflux disease)   . Headache    occasionally  . Heart murmur    hx of with pregnancy  . History of radiation therapy 11/02/2015-12/12/2015   external beam to pelvis  . Smoking hx    quit in 2016, smoked on and off for 20 years, light smoker   Past Surgical History:  Procedure Laterality Date  . DILATION AND CURETTAGE, DIAGNOSTIC / THERAPEUTIC  1990  . TONSILLECTOMY     Family History  Problem Relation Age of Onset  . Hypertension Mother   . Arthritis Mother   . Alcohol abuse Neg Hx   . Cancer Neg Hx   . COPD Neg Hx   . Depression Neg Hx   . Diabetes Neg Hx   . Early death Neg Hx   . Heart disease Neg Hx   . Hyperlipidemia Neg Hx   . Stroke Neg Hx    Social History   Socioeconomic History  . Marital status: Married    Spouse name: Not on file  . Number of children: 2  . Years of education: Not on file  .  Highest education level: Not on file  Social Needs  . Financial resource strain: Not on file  . Food insecurity - worry: Not on file  . Food insecurity - inability: Not on file  . Transportation needs - medical: Not on file  . Transportation needs - non-medical: Not on file  Occupational History  . Occupation: bookkeeper  Tobacco Use  . Smoking status: Former Smoker    Packs/day: 0.25    Years: 30.00    Pack years: 7.50    Types: Cigarettes    Last attempt to quit: 10/27/2015    Years since quitting: 1.3  . Smokeless tobacco: Never Used  Substance and  Sexual Activity  . Alcohol use: No    Alcohol/week: 0.0 oz  . Drug use: No  . Sexual activity: Not Currently    Birth control/protection: Post-menopausal  Other Topics Concern  . 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 pantoprazole (PROTONIX) 20 MG tablet Take 1 tablet (20 mg total) by mouth daily. 30 tablet 3   No current facility-administered medications on file prior to visit.    Allergies  Allergen Reactions  . Penicillins     Childhood reaction     Review of systems: Constitutional:  She has no weight gain or weight loss. She has no fever or chills. Eyes: No blurred vision Ears, Nose, Mouth, Throat: No dizziness, headaches or changes in hearing. No mouth sores. Cardiovascular: No chest pain, palpitations or edema. Respiratory:  No shortness of breath, wheezing or cough Gastrointestinal: She has normal bowel movements without diarrhea or constipation. She denies any nausea or vomiting. She denies blood in her stool or heart burn. Genitourinary:  She denies pelvic pain, pelvic pressure or changes in her urinary function. She has no hematuria, dysuria, or incontinence. No vaginal spotting Musculoskeletal: Denies muscle weakness or joint pains.  Skin:  She has no skin changes, rashes or itching Neurological:  Denies dizziness or headaches. No neuropathy, no numbness or tingling. Psychiatric:  She denies depression or anxiety. Hematologic/Lymphatic:   No easy bruising or bleeding   Physical Exam: Blood pressure 120/86, pulse 67, temperature 98.1 F (36.7 C), temperature source Oral, resp. rate 18, height 5\' 1"  (1.549 m), weight 174 lb 1.6 oz (79 kg), last  menstrual period 08/01/2011, SpO2 100 %. General: Well dressed, well nourished in no apparent distress.   HEENT:  Normocephalic and atraumatic, no lesions.  Extraocular muscles intact. Sclerae anicteric. Pupils equal, round, reactive. No mouth sores or ulcers. Thyroid is normal size, not nodular, midline. Skin:  No lesions or rashes. Breasts:  deferred Lungs:  deferred Cardiovascular: deferred Abdomen:  Soft, nontender, nondistended.  No palpable masses.  No hepatosplenomegaly.  No ascites. Normal bowel sounds.  No hernias.  Incisions are soft and healed. Genitourinary: Normal EGBUS  Vaginal cuff is smooth with no palpable or visible lesions. There ware no deeper masses appreciated.  Extremities: No cyanosis, clubbing or edema.  No calf tenderness or erythema. No palpable cords. Psychiatric: Mood and affect are appropriate. Neurological: Awake, alert and oriented x 3. Sensation is intact, no neuropathy.  Musculoskeletal: No pain, normal strength and range of motion.  Donaciano Eva, MD

## 2017-02-19 NOTE — Patient Instructions (Signed)
Please notify Dr Denman George at phone number [phone] if you notice vaginal bleeding, new pelvic or abdominal pains, bloating, feeling full easy, or a change in bladder or bowel function.   Please return to see Dr Denman George in February, 2019 as scheduled.

## 2017-04-17 ENCOUNTER — Encounter: Payer: Self-pay | Admitting: Family Medicine

## 2017-05-28 ENCOUNTER — Inpatient Hospital Stay: Payer: BLUE CROSS/BLUE SHIELD | Attending: Gynecologic Oncology | Admitting: Gynecologic Oncology

## 2017-05-28 ENCOUNTER — Encounter: Payer: Self-pay | Admitting: Gynecologic Oncology

## 2017-05-28 VITALS — BP 109/65 | HR 68 | Temp 98.4°F | Resp 18 | Wt 172.0 lb

## 2017-05-28 DIAGNOSIS — Z9221 Personal history of antineoplastic chemotherapy: Secondary | ICD-10-CM | POA: Insufficient documentation

## 2017-05-28 DIAGNOSIS — C541 Malignant neoplasm of endometrium: Secondary | ICD-10-CM | POA: Diagnosis not present

## 2017-05-28 DIAGNOSIS — Z923 Personal history of irradiation: Secondary | ICD-10-CM | POA: Diagnosis not present

## 2017-05-28 NOTE — Progress Notes (Signed)
GYN Karina Briggs  Assessment:    57 y.o. 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 S/p definitive therapy with chemotherapy and radiation. Complete clinical response Plan: Recommend follow--up with me in 58 months until 02/2018.  HPI:  Karina Briggs is a 57 y.o. year old initially seen in consultation on 09/26/14 referred by Dr Nori Riis for grade 1 endometrial cancer.  She then underwent a robotic hysterectomy, BSO, and bilateral sentinel lymph node biopsy on 8/93/81 without complications.  Her postoperative course was uncomplicated.  Her final pathologic diagnosis is a Stage IA Grade 1 endometrioid endometrial cancer with no lymphovascular space invasion, 5/28 mm (25%) of myometrial invasion and negative lymph nodes.  She had a history of postcoital spotting since March, 2017. She saw Dr Nori Riis for routine surveillance exam in may, 2017 and a pap smear was taken which showed endometrial cells and a second showed endometrial cells. As response to this a colposcopy was performed and revealed a nodule at the right vaginal cuff which was biopsied on 10/02/15 which confirmed adenocarcinoma similar to her previous endometrial cancer.   Imaging confirmed that the recurrence was localized to the vaginal cuff with extension into the deeper paracolpos tissues on the right (2.3cm).  She completed external beam radiation with 45Gy in 25 fractions to the pelvis with a vaginal boost of 5.4 Gy n 3 fractions between 11/02/15 and 12/12/15, with 5 cycles of radiosensitizing CDDP. On 12/28/15 she commenced vaginal cuff boost with HDR therapy (58Gy x 2)  She has done well with no enduring toxicities beyond therapy.  CT imaging on 02/13/16 (post- therapy) showed no new metastases or progression. There is a 1.6x2.1cm unenhanced asymmetric tissue density to the right of the vaginal cuff which is decreased in size from pretreatment  dimensions (2.0x2.3).  Repeat CT imaging in February, 2018 showed continued decrease in size of right vaginal cuff mass.  Interval Hx: PET/CT on 12/24/16 showed no residual soft tissue abnormalities and no hypermetabolic areas. She is feeling very well with no vaginal bleeding, no pelvic pain, no concerns.  Her mother died in June 05, 2017.   Past Medical History:  Diagnosis Date  . Anemia    hx of with pregnancy  . Dizziness - light-headed 02/2010   treated by ENT and neurology  . endometrial ca dx'd 10/2015   endometrial  . GERD (gastroesophageal reflux disease)   . Headache    occasionally  . Heart murmur    hx of with pregnancy  . History of radiation therapy 11/02/2015-12/12/2015   external beam to pelvis  . Smoking hx    quit in 2016, smoked on and off for 20 years, light smoker   Past Surgical History:  Procedure Laterality Date  . DILATION AND CURETTAGE, DIAGNOSTIC / THERAPEUTIC  1990  . ROBOTIC ASSISTED TOTAL HYSTERECTOMY WITH BILATERAL SALPINGO OOPHERECTOMY Bilateral 11/01/2014   Procedure: ROBOTIC ASSISTED TOTAL LAPARSCOPIC  HYSTERECTOMY WITH BILATERAL SALPINGO [place] NODE MAPPING, LYMPHADENECTOMY;  Surgeon: [name], MD;  Location: [facility];  Service: Gynecology;  Laterality: Bilateral;  . TONSILLECTOMY     Family History  Problem Relation Age of Onset  . Hypertension Mother   . Arthritis Mother   . Alcohol abuse Neg Hx   . Cancer Neg Hx   . COPD Neg Hx   . Depression Neg Hx   . Diabetes Neg Hx   . Early death Neg Hx   . Heart  disease Neg Hx   . Hyperlipidemia Neg Hx   . Stroke Neg Hx    Social History   Socioeconomic History  . Marital status: Married    Spouse name: Not on file  . Number of children: 2  . Years of education: Not on file  . Highest education level: Not on file  Social Needs  . Financial resource strain: Not on file  . Food insecurity - worry: Not on file  . Food insecurity - inability: Not on file  . Transportation  needs - medical: Not on file  . Transportation needs - non-medical: Not on file  Occupational History  . Occupation: bookkeeper  Tobacco Use  . Smoking status: Former Smoker    Packs/day: 0.25    Years: 30.00    Pack years: 7.50    Types: Cigarettes    Last attempt to quit: 10/27/2015    Years since quitting: 1.5  . Smokeless tobacco: Never Used  Substance and Sexual Activity  . Alcohol use: No    Alcohol/week: 0.0 oz  . Drug use: No  . Sexual activity: Not Currently    Birth control/protection: Post-menopausal  Other Topics Concern  . 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 pantoprazole (PROTONIX) 20 MG tablet Take 1 tablet (20 mg total) by mouth daily. 30 tablet 3   No current facility-administered medications on file prior to visit.    Allergies  Allergen Reactions  . Penicillins     Childhood reaction     Review of systems: Constitutional:  She has no weight gain or weight loss. She has no fever or chills. Eyes: No blurred vision Ears, Nose, Mouth, Throat: No dizziness, headaches or changes in hearing. No mouth sores. Cardiovascular: No chest pain, palpitations or edema. Respiratory:  No shortness of breath, wheezing or cough Gastrointestinal: She has normal bowel movements without diarrhea or constipation. She denies any nausea or vomiting. She denies blood in her stool or heart burn. Genitourinary:  She denies pelvic pain, pelvic pressure or changes in her urinary function. She has no hematuria, dysuria, or incontinence. No vaginal spotting Musculoskeletal: Denies muscle weakness or joint pains.  Skin:  She has no skin changes, rashes or  itching Neurological:  Denies dizziness or headaches. No neuropathy, no numbness or tingling. Psychiatric:  She denies depression or anxiety. Hematologic/Lymphatic:   No easy bruising or bleeding   Physical Exam: Blood pressure 109/65, pulse 68, temperature 98.4 F (36.9 C), temperature source Oral, resp. rate 18, weight 172 lb (78 kg), last menstrual period 08/01/2011, SpO2 100 %. General: Well dressed, well nourished in no apparent distress.   HEENT:  Normocephalic and atraumatic, no lesions.  Extraocular muscles intact. Sclerae anicteric. Pupils equal, round, reactive. No mouth sores or ulcers. Thyroid is normal size, not nodular, midline. Skin:  No lesions or rashes. Breasts:  deferred Lungs:  deferred Cardiovascular: deferred Abdomen:  Soft, nontender, nondistended.  No palpable masses.  No hepatosplenomegaly.  No ascites. Normal bowel sounds.  No hernias.  Incisions are soft and healed. Genitourinary: Normal EGBUS  Vaginal cuff is smooth with no palpable or visible lesions. There ware no deeper masses  appreciated.  Extremities: No cyanosis, clubbing or edema.  No calf tenderness or erythema. No palpable cords. Psychiatric: Mood and affect are appropriate. Neurological: Awake, alert and oriented x 3. Sensation is intact, no neuropathy.  Musculoskeletal: No pain, normal strength and range of motion.  Donaciano Eva, MD

## 2017-05-28 NOTE — Patient Instructions (Signed)
Please notify Dr Denman George at phone number [phone] if you notice vaginal bleeding, new pelvic or abdominal pains, bloating, feeling full easy, or a change in bladder or bowel function.   Please return to see Dr Denman George in 3 months for follow-up.

## 2017-08-26 ENCOUNTER — Inpatient Hospital Stay: Payer: BLUE CROSS/BLUE SHIELD | Attending: Gynecologic Oncology | Admitting: Gynecologic Oncology

## 2017-08-26 ENCOUNTER — Other Ambulatory Visit: Payer: Self-pay | Admitting: Family Medicine

## 2017-08-26 ENCOUNTER — Encounter: Payer: Self-pay | Admitting: Gynecologic Oncology

## 2017-08-26 VITALS — BP 117/74 | HR 58 | Temp 97.9°F | Resp 18 | Ht 61.0 in | Wt 176.0 lb

## 2017-08-26 DIAGNOSIS — K219 Gastro-esophageal reflux disease without esophagitis: Secondary | ICD-10-CM

## 2017-08-26 DIAGNOSIS — C7982 Secondary malignant neoplasm of genital organs: Secondary | ICD-10-CM | POA: Diagnosis not present

## 2017-08-26 DIAGNOSIS — Z90722 Acquired absence of ovaries, bilateral: Secondary | ICD-10-CM | POA: Insufficient documentation

## 2017-08-26 DIAGNOSIS — Z923 Personal history of irradiation: Secondary | ICD-10-CM | POA: Insufficient documentation

## 2017-08-26 DIAGNOSIS — C541 Malignant neoplasm of endometrium: Secondary | ICD-10-CM | POA: Diagnosis not present

## 2017-08-26 DIAGNOSIS — Z9221 Personal history of antineoplastic chemotherapy: Secondary | ICD-10-CM | POA: Diagnosis not present

## 2017-08-26 DIAGNOSIS — N3942 Incontinence without sensory awareness: Secondary | ICD-10-CM | POA: Insufficient documentation

## 2017-08-26 DIAGNOSIS — Z87891 Personal history of nicotine dependence: Secondary | ICD-10-CM | POA: Diagnosis not present

## 2017-08-26 DIAGNOSIS — Z90710 Acquired absence of both cervix and uterus: Secondary | ICD-10-CM | POA: Insufficient documentation

## 2017-08-26 DIAGNOSIS — R32 Unspecified urinary incontinence: Secondary | ICD-10-CM | POA: Insufficient documentation

## 2017-08-26 NOTE — Patient Instructions (Addendum)
Please notify Dr Denman George at phone number [phone] if you notice vaginal bleeding, new pelvic or abdominal pains, bloating, feeling full easy, or a change in bladder or bowel function.   Please return to see Dr Denman George as scheduled in 3 months.  If your bladder leakage symptoms become worse, please let her know as she will have you evaluated by a specialist for this.

## 2017-08-26 NOTE — Progress Notes (Signed)
GYN Paticia Briggs  Assessment:    57 y.o. 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 S/p definitive therapy with chemotherapy and radiation. Complete clinical response Plan: Recommend follow--up with me in 3 months until 02/2018.  HPI:  Karina Briggs is a 57 y.o. year old initially seen in consultation on 09/26/14 referred by Dr Karina Riis for grade 1 endometrial cancer.  She then underwent a robotic hysterectomy, BSO, and bilateral sentinel lymph node biopsy on 6/50/35 without complications.  Her postoperative course was uncomplicated.  Her final pathologic diagnosis is a Stage IA Grade 1 endometrioid endometrial cancer with no lymphovascular space invasion, 5/28 mm (25%) of myometrial invasion and negative lymph nodes.  She had a history of postcoital spotting since March, 2017. She saw Dr Karina Riis for routine surveillance exam in may, 2017 and a pap smear was taken which showed endometrial cells and a second showed endometrial cells. As response to this a colposcopy was performed and revealed a nodule at the right vaginal cuff which was biopsied on 10/02/15 which confirmed adenocarcinoma similar to her previous endometrial cancer.   Imaging confirmed that the recurrence was localized to the vaginal cuff with extension into the deeper paracolpos tissues on the right (2.3cm).  She completed external beam radiation with 45Gy in 25 fractions to the pelvis with a vaginal boost of 5.4 Gy n 3 fractions between 11/02/15 and 12/12/15, with 5 cycles of radiosensitizing CDDP. On 12/28/15 she commenced vaginal cuff boost with HDR therapy (6Gy x 2).  She has done well with no enduring toxicities beyond therapy.  CT imaging on 02/13/16 (post- therapy) showed no new metastases or progression. There is a 1.6x2.1cm unenhanced asymmetric tissue density to the right of the vaginal cuff which is decreased in size from pretreatment  dimensions (2.0x2.3).  Repeat CT imaging in February, 2018 showed continued decrease in size of right vaginal cuff mass.  Interval Hx: PET/CT on 12/24/16 showed no residual soft tissue abnormalities and no hypermetabolic areas. She is feeling very well with no vaginal bleeding, no pelvic pain, no concerns. She does have some insensate occasional leakage of urine (not with stress or urge), for which she wears a pantiliner.  Her son is getting married in October, 2019.    Past Medical History:  Diagnosis Date  . Anemia    hx of with pregnancy  . Dizziness - light-headed 02/2010   treated by ENT and neurology  . endometrial ca dx'd 10/2015   endometrial  . GERD (gastroesophageal reflux disease)   . Headache    occasionally  . Heart murmur    hx of with pregnancy  . History of radiation therapy 11/02/2015-12/12/2015   external beam to pelvis  . Smoking hx    quit in 2016, smoked on and off for 20 years, light smoker   Past Surgical History:  Procedure Laterality Date  . DILATION AND CURETTAGE, DIAGNOSTIC / THERAPEUTIC  1990  . ROBOTIC ASSISTED TOTAL HYSTERECTOMY WITH BILATERAL SALPINGO OOPHERECTOMY Bilateral 11/01/2014   Procedure: ROBOTIC ASSISTED TOTAL LAPARSCOPIC  HYSTERECTOMY WITH BILATERAL SALPINGO [place] NODE MAPPING, LYMPHADENECTOMY;  Surgeon: [name], MD;  Location: [facility];  Service: Gynecology;  Laterality: Bilateral;  . TONSILLECTOMY     Family History  Problem Relation Age of Onset  . Hypertension Mother   . Arthritis Mother   . Alcohol abuse Neg Hx   . Cancer Neg Hx   . COPD Neg Hx   .  Depression Neg Hx   . Diabetes Neg Hx   . Early death Neg Hx   . Heart disease Neg Hx   . Hyperlipidemia Neg Hx   . Stroke Neg Hx    Social History   Socioeconomic History  . Marital status: Married    Spouse name: Not on file  . Number of children: 2  . Years of education: Not on file  . Highest education level: Not on file  Occupational History  .  Occupation: bookkeeper  Scientific laboratory technician  . Financial resource strain: Not on file  . Food insecurity:    Worry: Not on file    Inability: Not on file  . Transportation needs:    Medical: Not on file    Non-medical: Not on file  Tobacco Use  . Smoking status: Former Smoker    Packs/day: 0.25    Years: 30.00    Pack years: 7.50    Types: Cigarettes    Last attempt to quit: 10/27/2015    Years since quitting: 1.8  . Smokeless tobacco: Never Used  Substance and Sexual Activity  . Alcohol use: No    Alcohol/week: 0.0 oz  . Drug use: No  . Sexual activity: Not Currently    Birth control/protection: Post-menopausal  Lifestyle  . Physical activity:    Days per week: Not on file    Minutes per session: Not on file  . Stress: Not on file  Relationships  . Social connections:    Talks on phone: Not on file    Gets together: Not on file    Attends religious service: Not on file    Active member of club or organization: Not on file    Attends meetings of clubs or organizations: Not on file    Relationship status: Not on file  . Intimate partner violence:    Fear of current or ex partner: Not on file    Emotionally abused: Not on file    Physically abused: Not on file    Forced sexual activity: Not on file  Other Topics Concern  . 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   No current facility-administered medications on file prior to visit.    Allergies  Allergen Reactions  . Penicillins     Childhood reaction     Review of systems: Constitutional:  She has no weight gain or weight loss. She has no fever or chills. Eyes: No blurred vision Ears, Nose, Mouth, Throat: No dizziness,  headaches or changes in hearing. No mouth sores. Cardiovascular: No chest pain, palpitations or edema. Respiratory:  No shortness of breath, wheezing or cough Gastrointestinal: She has normal bowel movements without diarrhea or constipation. She denies any nausea or vomiting. She denies blood in her stool or heart burn. Genitourinary:  She denies pelvic pain, pelvic pressure. + occasional insensate leakage of small volume urine (not classic stress incontinence). She has no hematuria, dysuria, or incontinence. No vaginal spotting Musculoskeletal: Denies muscle weakness or joint pains.  Skin:  She has no skin changes, rashes or itching Neurological:  Denies dizziness or headaches. No neuropathy, no numbness or tingling. Psychiatric:  She denies depression or anxiety. Hematologic/Lymphatic:  No easy bruising or bleeding   Physical Exam: Blood pressure 117/74, pulse (!) 58, temperature 97.9 F (36.6 C), temperature source Oral, resp. rate 18, height 5\' 1"  (1.549 m), weight 176 lb (79.8 kg), last menstrual period 08/01/2011, SpO2 100 %. General: Well dressed, well nourished in no apparent distress.   HEENT:  Normocephalic and atraumatic, no lesions.  Extraocular muscles intact. Sclerae anicteric. Pupils equal, round, reactive. No mouth sores or ulcers. Thyroid is normal size, not nodular, midline. Skin:  No lesions or rashes. Breasts:  deferred Lungs:  deferred Cardiovascular: deferred Abdomen:  Soft, nontender, nondistended.  No palpable masses.  No hepatosplenomegaly.  No ascites. Normal bowel sounds.  No hernias.  Incisions are soft and healed. Genitourinary: Normal EGBUS  Vaginal cuff is smooth with no palpable or visible lesions. There are no deeper masses appreciated. There is some petechial changes to the mucosa in the left fornix, but no visible or palpable lesions. Extremities: No cyanosis, clubbing or edema.  No calf tenderness or erythema. No palpable cords. Psychiatric: Mood and  affect are appropriate. Neurological: Awake, alert and oriented x 3. Sensation is intact, no neuropathy.  Musculoskeletal: No pain, normal strength and range of motion.  Thereasa Solo, MD

## 2017-08-27 ENCOUNTER — Telehealth: Payer: Self-pay

## 2017-08-27 NOTE — Telephone Encounter (Signed)
LM in patient's VM. (Message identified the patient's name) with new appointment date and time for 12-03-17 at 1430.  Arrive at 1415 to register.

## 2017-11-07 ENCOUNTER — Ambulatory Visit: Payer: BLUE CROSS/BLUE SHIELD | Admitting: Gynecologic Oncology

## 2017-11-11 ENCOUNTER — Telehealth: Payer: Self-pay | Admitting: *Deleted

## 2017-11-11 NOTE — Telephone Encounter (Signed)
Patient called and rescheduled her appt from 8/28 to 9/30

## 2017-12-03 ENCOUNTER — Ambulatory Visit: Payer: BLUE CROSS/BLUE SHIELD | Admitting: Gynecologic Oncology

## 2017-12-16 DIAGNOSIS — Z1231 Encounter for screening mammogram for malignant neoplasm of breast: Secondary | ICD-10-CM | POA: Diagnosis not present

## 2017-12-16 DIAGNOSIS — Z13820 Encounter for screening for osteoporosis: Secondary | ICD-10-CM | POA: Diagnosis not present

## 2018-01-01 ENCOUNTER — Encounter: Payer: BLUE CROSS/BLUE SHIELD | Admitting: Family Medicine

## 2018-01-05 ENCOUNTER — Encounter: Payer: Self-pay | Admitting: Gynecologic Oncology

## 2018-01-05 ENCOUNTER — Inpatient Hospital Stay: Payer: BLUE CROSS/BLUE SHIELD | Attending: Gynecologic Oncology | Admitting: Gynecologic Oncology

## 2018-01-05 VITALS — BP 109/59 | HR 77 | Temp 98.2°F | Resp 18 | Ht 61.0 in | Wt 180.2 lb

## 2018-01-05 DIAGNOSIS — Z9221 Personal history of antineoplastic chemotherapy: Secondary | ICD-10-CM

## 2018-01-05 DIAGNOSIS — C541 Malignant neoplasm of endometrium: Secondary | ICD-10-CM

## 2018-01-05 DIAGNOSIS — Z8542 Personal history of malignant neoplasm of other parts of uterus: Secondary | ICD-10-CM | POA: Diagnosis not present

## 2018-01-05 DIAGNOSIS — C7982 Secondary malignant neoplasm of genital organs: Secondary | ICD-10-CM

## 2018-01-05 DIAGNOSIS — Z923 Personal history of irradiation: Secondary | ICD-10-CM | POA: Insufficient documentation

## 2018-01-05 DIAGNOSIS — Z08 Encounter for follow-up examination after completed treatment for malignant neoplasm: Secondary | ICD-10-CM | POA: Diagnosis not present

## 2018-01-05 NOTE — Progress Notes (Signed)
GYN [name]  Assessment:    57 y.o. 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 S/p definitive therapy with chemotherapy and radiation. Complete clinical response  Plan:  PET/CT to evaluate for completeness of response to salvage therapy. Recommend follow--up with me in 6 months until 02/2021  HPI:  Karina Briggs is a 56 y.o. year old initially seen in consultation on 09/26/14 referred by Dr Nori Riis for grade 1 endometrial cancer.  She then underwent a robotic hysterectomy, BSO, and bilateral sentinel lymph node biopsy on 9/50/93 without complications.  Her postoperative course was uncomplicated.  Her final pathologic diagnosis is a Stage IA Grade 1 endometrioid endometrial cancer with no lymphovascular space invasion, 5/28 mm (25%) of myometrial invasion and negative lymph nodes.  She had a history of postcoital spotting since March, 2017. She saw Dr Nori Riis for routine surveillance exam in may, 2017 and a pap smear was taken which showed endometrial cells and a second showed endometrial cells. As response to this a colposcopy was performed and revealed a nodule at the right vaginal cuff which was biopsied on 10/02/15 which confirmed adenocarcinoma similar to her previous endometrial cancer.   Imaging confirmed that the recurrence was localized to the vaginal cuff with extension into the deeper paracolpos tissues on the right (2.3cm).  She completed external beam radiation with 45Gy in 25 fractions to the pelvis with a vaginal boost of 5.4 Gy n 3 fractions between 11/02/15 and 12/12/15, with 5 cycles of radiosensitizing CDDP. On 12/28/15 she commenced vaginal cuff boost with HDR therapy (6Gy x 2).  She has done well with no enduring toxicities beyond therapy.  CT imaging on 02/13/16 (post- therapy) showed no new metastases or progression. There is a 1.6x2.1cm unenhanced asymmetric tissue density to the  right of the vaginal cuff which is decreased in size from pretreatment dimensions (2.0x2.3).  Repeat CT imaging in February, 2018 showed continued decrease in size of right vaginal cuff mass.  Interval Hx: PET/CT on 12/24/16 showed no residual soft tissue abnormalities and no hypermetabolic areas. She is feeling very well with no vaginal bleeding, no pelvic pain, no concerns. She does have some insensate occasional leakage of urine (not with stress or urge), for which she wears a pantiliner.  Her son is getting married this weekend.    Past Medical History:  Diagnosis Date  . Anemia    hx of with pregnancy  . Dizziness - light-headed 02/2010   treated by ENT and neurology  . endometrial ca dx'd 10/2015   endometrial  . GERD (gastroesophageal reflux disease)   . Headache    occasionally  . Heart murmur    hx of with pregnancy  . History of radiation therapy 11/02/2015-12/12/2015   external beam to pelvis  . Smoking hx    quit in 2016, smoked on and off for 20 years, light smoker   Past Surgical History:  Procedure Laterality Date  . DILATION AND CURETTAGE, DIAGNOSTIC / THERAPEUTIC  1990  . ROBOTIC ASSISTED TOTAL HYSTERECTOMY WITH BILATERAL SALPINGO OOPHERECTOMY Bilateral 11/01/2014   Procedure: ROBOTIC ASSISTED TOTAL LAPARSCOPIC  HYSTERECTOMY WITH BILATERAL SALPINGO [place] NODE MAPPING, LYMPHADENECTOMY;  Surgeon: [name], MD;  Location: [facility];  Service: Gynecology;  Laterality: Bilateral;  . TONSILLECTOMY     Family History  Problem Relation Age of Onset  . Hypertension Mother   . Arthritis Mother   . Alcohol abuse Neg Hx   .  Cancer Neg Hx   . COPD Neg Hx   . Depression Neg Hx   . Diabetes Neg Hx   . Early death Neg Hx   . Heart disease Neg Hx   . Hyperlipidemia Neg Hx   . Stroke Neg Hx    Social History   Socioeconomic History  . Marital status: Married    Spouse name: Not on file  . Number of children: 2  . Years of education: Not on file  .  Highest education level: Not on file  Occupational History  . Occupation: bookkeeper  Scientific laboratory technician  . Financial resource strain: Not on file  . Food insecurity:    Worry: Not on file    Inability: Not on file  . Transportation needs:    Medical: Not on file    Non-medical: Not on file  Tobacco Use  . Smoking status: Former Smoker    Packs/day: 0.25    Years: 30.00    Pack years: 7.50    Types: Cigarettes    Last attempt to quit: 10/27/2015    Years since quitting: 2.1  . Smokeless tobacco: Never Used  Substance and Sexual Activity  . Alcohol use: No    Alcohol/week: 0.0 standard drinks  . Drug use: No  . Sexual activity: Not Currently    Birth control/protection: Post-menopausal  Lifestyle  . Physical activity:    Days per week: Not on file    Minutes per session: Not on file  . Stress: Not on file  Relationships  . Social connections:    Talks on phone: Not on file    Gets together: Not on file    Attends religious service: Not on file    Active member of club or organization: Not on file    Attends meetings of clubs or organizations: Not on file    Relationship status: Not on file  . Intimate partner violence:    Fear of current or ex partner: Not on file    Emotionally abused: Not on file    Physically abused: Not on file    Forced sexual activity: Not on file  Other Topics Concern  . 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 loratadine (CLARITIN) 10 MG tablet Take 10 mg by mouth daily.    . pantoprazole (PROTONIX) 20 MG tablet TAKE 1 TABLET BY MOUTH EVERY DAY 30 tablet 3   No current facility-administered medications on file prior to visit.     Allergies  Allergen Reactions  . Penicillins     Childhood reaction     Review of systems: Constitutional:  She has no weight gain or weight loss. She has no fever or chills. Eyes: No blurred vision Ears, Nose, Mouth, Throat: No dizziness, headaches or changes in hearing. No mouth sores. Cardiovascular: No chest pain, palpitations or edema. Respiratory:  No shortness of breath, wheezing or cough Gastrointestinal: She has normal bowel movements without diarrhea or constipation. She denies any nausea or vomiting. She denies blood in her stool or heart burn. Genitourinary:  She denies pelvic pain, pelvic pressure. + occasional insensate leakage of small volume urine (not classic stress  incontinence). She has no hematuria, dysuria, or incontinence. No vaginal spotting Musculoskeletal: Denies muscle weakness or joint pains.  Skin:  She has no skin changes, rashes or itching Neurological:  Denies dizziness or headaches. No neuropathy, no numbness or tingling. Psychiatric:  She denies depression or anxiety. Hematologic/Lymphatic:   No easy bruising or bleeding   Physical Exam: Blood pressure (!) 109/59, pulse 77, temperature 98.2 F (36.8 C), temperature source Oral, resp. rate 18, height 5\' 1"  (1.549 m), weight 180 lb 4 oz (81.8 kg), last menstrual period 08/01/2011, SpO2 100 %. General: Well dressed, well nourished in no apparent distress.   HEENT:  Normocephalic and atraumatic, no lesions.  Extraocular muscles intact. Sclerae anicteric. Pupils equal, round, reactive. No mouth sores or ulcers. Thyroid is normal size, not nodular, midline. Skin:  No lesions or rashes. Breasts:  deferred Lungs:  deferred Cardiovascular: deferred Abdomen:  Soft, nontender, nondistended.  No palpable masses.  No hepatosplenomegaly.  No ascites. Normal bowel sounds.  No hernias.  Incisions are soft and healed. Genitourinary: Normal EGBUS  Vaginal cuff is smooth with no palpable or visible lesions. There are  no deeper masses appreciated. There is some petechial changes to the mucosa in the left fornix, but no visible or palpable lesions. Extremities: No cyanosis, clubbing or edema.  No calf tenderness or erythema. No palpable cords. Psychiatric: Mood and affect are appropriate. Neurological: Awake, alert and oriented x 3. Sensation is intact, no neuropathy.  Musculoskeletal: No pain, normal strength and range of motion.  Thereasa Solo, MD

## 2018-01-05 NOTE — Patient Instructions (Signed)
Please notify Dr Denman George at phone number [phone] if you notice vaginal bleeding, new pelvic or abdominal pains, bloating, feeling full easy, or a change in bladder or bowel function.   Please return to see Dr Denman George in March of 2020. You will be able to schedule this appointment after October, 2019.   Dr Denman George has scheduled a PET/CT to evaluate for your disease status. She is not concerned for recurrence based on your exam findings.

## 2018-01-13 ENCOUNTER — Telehealth: Payer: Self-pay

## 2018-01-13 ENCOUNTER — Ambulatory Visit (HOSPITAL_COMMUNITY)
Admission: RE | Admit: 2018-01-13 | Discharge: 2018-01-13 | Disposition: A | Discharge status: Home / Self Care | Payer: BLUE CROSS/BLUE SHIELD | Source: Ambulatory Visit | Attending: Gynecologic Oncology | Admitting: Gynecologic Oncology

## 2018-01-13 DIAGNOSIS — C541 Malignant neoplasm of endometrium: Secondary | ICD-10-CM | POA: Insufficient documentation

## 2018-01-13 DIAGNOSIS — C7982 Secondary malignant neoplasm of genital organs: Secondary | ICD-10-CM | POA: Diagnosis not present

## 2018-01-13 LAB — GLUCOSE, CAPILLARY: Glucose-Capillary: 95 mg/dL (ref 70–99)

## 2018-01-13 MED ORDER — FLUDEOXYGLUCOSE F - 18 (FDG) INJECTION
8.8800 | Freq: Once | INTRAVENOUS | Status: AC | PRN
  Administered 2018-01-13: 8.88 via INTRAVENOUS

## 2018-01-13 NOTE — Telephone Encounter (Signed)
Told Ms Karina Briggs the results of Pet Scan as noted below by Dr. Denman George. Pt to call in 03-2018 to schedule the appointment for follow up in March 2020.

## 2018-01-13 NOTE — Telephone Encounter (Signed)
Karina Amber, MD  [name], NP        Good scan result, we can transition to 6 monthly surveillance checks.  [name]

## 2018-01-24 NOTE — Progress Notes (Signed)
HPI:  Using dictation device. Unfortunately this device frequently misinterprets words/phrases.  Here for CPE: Due for lipids, hgba1c, hep c screen, flu vaccine  -Concerns and/or follow up today:  Karina Briggs is a pleasant 57 y.o. here for follow up. Chronic medical problems summarized below were reviewed for changes and stability and were updated as needed below. These issues and their treatment remain stable for the most part.  Diet was not as good this year she had to move to [place] frequently to be with her mother who passed away.  She now plans to get back on track with the Mediterranean diet.  She is walking on a regular basis.  Reports she feels well.  Denies CP, SOB, DOE, treatment intolerance or new symptoms. Reports she has her GYN exams with Dr. Johnney Ou office.  Had her mammogram and DEXA there a month ago per her report. She would like to get shingles vaccine.  Obesity: -diet not great. 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 S/p hysterectomy and treatment with chemo and radiation. Seeing gyn/onc (Dr. Denman George and Dr. Nori Riis) for management.  -Diet: variety of foods, balance and well rounded, larger portion sizes -Exercise: no regular exercise -Taking folic acid, vitamin D or calcium: no -Diabetes and Dyslipidemia Screening: Fasting for labs today -Vaccines: see vaccine section EPIC -wants STI testing (Hep C if born [birth year]): no -FH breast, colon or ovarian ca: see FH Last mammogram: Reports did with Dr. Johnney Ou office 1 month ago Last colon cancer screening: 04/2011 Breast Ca Risk Assessment: see family history and pt history DEXA (>/= 23): Reports done with Dr. Johnney Ou office 1 month ago  -Alcohol, Tobacco, drug use: see social history  Review of Systems - no reported fevers, unintentional weight loss, vision loss, hearing loss, chest pain, sob, hemoptysis, melena, hematochezia, hematuria, genital discharge, changing or  concerning skin lesions, bleeding, bruising, loc, thoughts of self harm or SI  Past Medical History:  Diagnosis Date  . Anemia    hx of with pregnancy  . Dizziness - light-headed 02/2010   treated by ENT and neurology  . endometrial ca dx'd 10/2015   endometrial  . GERD (gastroesophageal reflux disease)   . Headache    occasionally  . Heart murmur    hx of with pregnancy  . History of radiation therapy 11/02/2015-12/12/2015   external beam to pelvis  . Smoking hx    quit in 2016, smoked on and off for 20 years, light smoker    Past Surgical History:  Procedure Laterality Date  . DILATION AND CURETTAGE, DIAGNOSTIC / THERAPEUTIC  1990  . ROBOTIC ASSISTED TOTAL HYSTERECTOMY WITH BILATERAL SALPINGO OOPHERECTOMY Bilateral 11/01/2014   Procedure: ROBOTIC ASSISTED TOTAL LAPARSCOPIC  HYSTERECTOMY WITH BILATERAL SALPINGO [place] NODE MAPPING, LYMPHADENECTOMY;  Surgeon: [name], MD;  Location: [facility];  Service: Gynecology;  Laterality: Bilateral;  . TONSILLECTOMY      Family History  Problem Relation Age of Onset  . Hypertension Mother   . Arthritis Mother   . Alcohol abuse Neg Hx   . Cancer Neg Hx   . COPD Neg Hx   . Depression Neg Hx   . Diabetes Neg Hx   . Early death Neg Hx   . Heart disease Neg Hx   . Hyperlipidemia Neg Hx   . Stroke Neg Hx     Social History   Socioeconomic History  . Marital status: Married    Spouse name: Not on  file  . Number of children: 2  . Years of education: Not on file  . Highest education level: Not on file  Occupational History  . Occupation: bookkeeper  Scientific laboratory technician  . Financial resource strain: Not on file  . Food insecurity:    Worry: Not on file    Inability: Not on file  . Transportation needs:    Medical: Not on file    Non-medical: Not on file  Tobacco Use  . Smoking status: Former Smoker    Packs/day: 0.25    Years: 30.00    Pack years: 7.50    Types: Cigarettes    Last attempt to quit: 10/27/2015    Years  since quitting: 2.2  . Smokeless tobacco: Never Used  Substance and Sexual Activity  . Alcohol use: No    Alcohol/week: 0.0 standard drinks  . Drug use: No  . Sexual activity: Not Currently    Birth control/protection: Post-menopausal  Lifestyle  . Physical activity:    Days per week: Not on file    Minutes per session: Not on file  . Stress: Not on file  Relationships  . Social connections:    Talks on phone: Not on file    Gets together: Not on file    Attends religious service: Not on file    Active member of club or organization: Not on file    Attends meetings of clubs or organizations: Not on file    Relationship status: Not on file  Other Topics Concern  . 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  loratadine (CLARITIN) 10 MG tablet, Take 10 mg by mouth daily., Disp: , Rfl:  .  pantoprazole (PROTONIX) 20 MG tablet, TAKE 1 TABLET BY MOUTH EVERY DAY, Disp: 30 tablet, Rfl: 3  EXAM:  Vitals:   01/26/18 0842  BP: 112/70  Pulse: 76  Temp: 98.3 F (36.8 C)  Body mass index is 32.36 kg/m.   GENERAL: vitals reviewed and listed below, alert, oriented, appears well hydrated and in no acute distress  HEENT: head atraumatic, PERRLA, normal appearance of eyes, ears, nose and mouth. moist mucus membranes.  NECK: supple, no masses or lymphadenopathy  LUNGS: clear to auscultation bilaterally, no rales, rhonchi or wheeze  CV: HRRR, no peripheral edema or cyanosis, normal pedal pulses  ABDOMEN: bowel sounds normal, soft, non tender to palpation, no masses, no rebound or guarding  GU/BREAST: declined, does with gyn  SKIN: no rash or abnormal lesions, declined full skin exam offered  MS: normal gait,  moves all extremities normally  NEURO: normal gait, speech and thought processing grossly intact, muscle tone grossly intact throughout  PSYCH: normal affect, pleasant and cooperative  ASSESSMENT AND PLAN:  Discussed the following assessment and plan:  PREVENTIVE EXAM: -Discussed and advised all [place] preventive services health task force level A and B recommendations for age, sex and risks. -Advised at least 150 minutes of exercise per week and a healthy diet with avoidance of (less then 1 serving per week) processed foods, white starches, red meat, fast foods and sweets and consisting of: * 5-9 servings of fresh fruits and vegetables (not corn or potatoes) *nuts and seeds, beans *olives and  olive oil *lean meats such as fish and white chicken  *whole grains -labs, studies and vaccines per orders this encounter -flu and shingles vaccines -hgba1c, lipids and hep c screening   Patient advised to return to clinic immediately if symptoms worsen or persist or new concerns.  Patient Instructions  BEFORE YOU LEAVE: -flu and new shingles vaccine -labs -follow up: yearly for physical and as needed  We have ordered labs or studies at this visit. It can take up to 1-2 weeks for results and processing. IF results require follow up or explanation, we will call you with instructions. Clinically stable results will be released to your [facility]. If you have not heard from [place] or cannot find your results in [facility] in 2 weeks please contact our office at [phone].  If you are not yet signed up for [facility], please consider signing up.   We recommend the following healthy lifestyle for LIFE: 1) Small portions. But, make sure to get regular (at least 3 per day), healthy meals and small healthy snacks if needed.  2) Eat a healthy clean diet.   TRY TO EAT: -at least 5-7 servings of low sugar, colorful, and nutrient rich vegetables per day (not corn, potatoes or bananas.) -berries are the best  choice if you wish to eat fruit (only eat small amounts if trying to reduce weight)  -lean meets (fish, white meat of chicken or [place]) -vegan proteins for some meals - beans or tofu, whole grains, nuts and seeds -Replace bad fats with good fats - good fats include: fish, nuts and seeds, canola oil, olive oil -small amounts of low fat or non fat dairy -small amounts of100 % whole grains - check the lables -drink plenty of water  AVOID: -SUGAR, sweets, anything with added sugar, corn syrup or sweeteners - must read labels as even foods advertised as "healthy" often are loaded with sugar -if you must have a sweetener, small amounts of stevia may be best -sweetened beverages and artificially sweetened beverages -simple starches (rice, bread, potatoes, pasta, chips, etc - small amounts of 100% whole grains are ok) -red meat, pork, butter -fried foods, fast food, processed food, excessive dairy, eggs and coconut.  3)Get at least 150 minutes of sweaty aerobic exercise per week.  4)Reduce stress - consider counseling, meditation and relaxation to balance other aspects of your life.   WE NOW OFFER    [facility] FAST TRACK!!!  SAME DAY Appointments for ACUTE CARE  Such as: Sprains, Injuries, cuts, abrasions, rashes, muscle pain, joint pain, back pain Colds, flu, sore throats, headache, allergies, cough, fever  Ear pain, sinus and eye infections Abdominal pain, nausea, vomiting, diarrhea, upset stomach Animal/insect bites  3 Easy Ways to Schedule: Walk-In Scheduling Call in scheduling Mychart Sign-up: [email or web address]              No follow-ups on file.  Lucretia Kern, DO

## 2018-01-26 ENCOUNTER — Encounter: Payer: Self-pay | Admitting: Family Medicine

## 2018-01-26 ENCOUNTER — Ambulatory Visit (INDEPENDENT_AMBULATORY_CARE_PROVIDER_SITE_OTHER): Payer: BLUE CROSS/BLUE SHIELD | Admitting: Family Medicine

## 2018-01-26 VITALS — BP 112/70 | HR 76 | Temp 98.3°F | Ht 62.5 in | Wt 179.8 lb

## 2018-01-26 DIAGNOSIS — Z6832 Body mass index (BMI) 32.0-32.9, adult: Secondary | ICD-10-CM | POA: Diagnosis not present

## 2018-01-26 DIAGNOSIS — Z1331 Encounter for screening for depression: Secondary | ICD-10-CM | POA: Diagnosis not present

## 2018-01-26 DIAGNOSIS — Z23 Encounter for immunization: Secondary | ICD-10-CM | POA: Diagnosis not present

## 2018-01-26 DIAGNOSIS — Z0000 Encounter for general adult medical examination without abnormal findings: Secondary | ICD-10-CM

## 2018-01-26 DIAGNOSIS — Z1159 Encounter for screening for other viral diseases: Secondary | ICD-10-CM

## 2018-01-26 LAB — LIPID PANEL
CHOLESTEROL: 217 mg/dL — AB (ref 0–200)
HDL: 61.4 mg/dL (ref >39.00)
LDL Cholesterol: 139 mg/dL — ABNORMAL HIGH (ref 0–99)
NONHDL: 155.42
Total CHOL/HDL Ratio: 4
Triglycerides: 81 mg/dL (ref 0.0–149.0)
VLDL: 16.2 mg/dL (ref 0.0–40.0)

## 2018-01-26 LAB — HEMOGLOBIN A1C: HEMOGLOBIN A1C: 5.3 % (ref 4.6–6.5)

## 2018-01-26 NOTE — Addendum Note (Signed)
Addended by: Agnes Lawrence on: 01/26/2018 09:53 AM   Modules accepted: Orders

## 2018-01-26 NOTE — Patient Instructions (Addendum)
BEFORE YOU LEAVE: -flu and new shingles vaccine -labs -follow up: yearly for physical and as needed  We have ordered labs or studies at this visit. It can take up to 1-2 weeks for results and processing. IF results require follow up or explanation, we will call you with instructions. Clinically stable results will be released to your [facility]. If you have not heard from [place] or cannot find your results in [facility] in 2 weeks please contact our office at [phone].  If you are not yet signed up for [facility], please consider signing up.   We recommend the following healthy lifestyle for LIFE: 1) Small portions. But, make sure to get regular (at least 3 per day), healthy meals and small healthy snacks if needed.  2) Eat a healthy clean diet.   TRY TO EAT: -at least 5-7 servings of low sugar, colorful, and nutrient rich vegetables per day (not corn, potatoes or bananas.) -berries are the best choice if you wish to eat fruit (only eat small amounts if trying to reduce weight)  -lean meets (fish, white meat of chicken or [place]) -vegan proteins for some meals - beans or tofu, whole grains, nuts and seeds -Replace bad fats with good fats - good fats include: fish, nuts and seeds, canola oil, olive oil -small amounts of low fat or non fat dairy -small amounts of100 % whole grains - check the lables -drink plenty of water  AVOID: -SUGAR, sweets, anything with added sugar, corn syrup or sweeteners - must read labels as even foods advertised as "healthy" often are loaded with sugar -if you must have a sweetener, small amounts of stevia may be best -sweetened beverages and artificially sweetened beverages -simple starches (rice, bread, potatoes, pasta, chips, etc - small amounts of 100% whole grains are ok) -red meat, pork, butter -fried foods, fast food, processed food, excessive dairy, eggs and coconut.  3)Get at least 150 minutes of sweaty aerobic exercise per week.  4)Reduce stress - consider  counseling, meditation and relaxation to balance other aspects of your life.   WE NOW OFFER   [facility] FAST TRACK!!!  SAME DAY Appointments for ACUTE CARE  Such as: Sprains, Injuries, cuts, abrasions, rashes, muscle pain, joint pain, back pain Colds, flu, sore throats, headache, allergies, cough, fever  Ear pain, sinus and eye infections Abdominal pain, nausea, vomiting, diarrhea, upset stomach Animal/insect bites  3 Easy Ways to Schedule: Walk-In Scheduling Call in scheduling Mychart Sign-up: [email or web address]

## 2018-01-27 LAB — HEPATITIS C ANTIBODY
Hepatitis C Ab: NONREACTIVE
SIGNAL TO CUT-OFF: 0.01 (ref <1.00)

## 2018-03-17 ENCOUNTER — Other Ambulatory Visit: Payer: Self-pay | Admitting: Family Medicine

## 2018-03-17 DIAGNOSIS — K219 Gastro-esophageal reflux disease without esophagitis: Secondary | ICD-10-CM

## 2018-04-13 ENCOUNTER — Ambulatory Visit (INDEPENDENT_AMBULATORY_CARE_PROVIDER_SITE_OTHER): Payer: BLUE CROSS/BLUE SHIELD | Admitting: *Deleted

## 2018-04-13 DIAGNOSIS — Z23 Encounter for immunization: Secondary | ICD-10-CM | POA: Diagnosis not present

## 2018-06-08 ENCOUNTER — Telehealth: Payer: Self-pay

## 2018-06-08 NOTE — Telephone Encounter (Signed)
Incoming call from pt to schedule her f/u with Dr Denman George. Appt given for 07-08-2018 at 1:15 pm.  Pt agreeable. No other needs per her at this time.

## 2018-06-30 ENCOUNTER — Telehealth: Payer: Self-pay | Admitting: *Deleted

## 2018-06-30 NOTE — Telephone Encounter (Signed)
Called and spoke with the patient regarding her appt on 4/1. Moved her appt from 4/1 to 5/8 due to COVID-19

## 2018-07-08 ENCOUNTER — Ambulatory Visit: Payer: BLUE CROSS/BLUE SHIELD | Admitting: Gynecologic Oncology

## 2018-07-31 ENCOUNTER — Other Ambulatory Visit: Payer: Self-pay | Admitting: Family Medicine

## 2018-07-31 DIAGNOSIS — K219 Gastro-esophageal reflux disease without esophagitis: Secondary | ICD-10-CM

## 2018-08-06 ENCOUNTER — Telehealth: Payer: Self-pay | Admitting: *Deleted

## 2018-08-06 NOTE — Telephone Encounter (Signed)
Called and left the patient a message to call the office back. Need to change the appt on 5/8 to either a phone or virtual visit

## 2018-08-06 NOTE — Telephone Encounter (Signed)
Patient called back, offered the patient a virutal visit, patient declined. Moved the app to 6/15

## 2018-08-14 ENCOUNTER — Ambulatory Visit: Payer: BLUE CROSS/BLUE SHIELD | Admitting: Gynecologic Oncology

## 2018-09-21 ENCOUNTER — Other Ambulatory Visit: Payer: Self-pay

## 2018-09-21 ENCOUNTER — Encounter: Payer: Self-pay | Admitting: Gynecologic Oncology

## 2018-09-21 ENCOUNTER — Inpatient Hospital Stay: Payer: BC Managed Care – PPO | Attending: Gynecologic Oncology | Admitting: Gynecologic Oncology

## 2018-09-21 VITALS — BP 108/92 | HR 80 | Temp 98.0°F | Ht 62.5 in | Wt 187.0 lb

## 2018-09-21 DIAGNOSIS — Z90722 Acquired absence of ovaries, bilateral: Secondary | ICD-10-CM | POA: Insufficient documentation

## 2018-09-21 DIAGNOSIS — C541 Malignant neoplasm of endometrium: Secondary | ICD-10-CM | POA: Diagnosis not present

## 2018-09-21 DIAGNOSIS — Z90710 Acquired absence of both cervix and uterus: Secondary | ICD-10-CM

## 2018-09-21 NOTE — Patient Instructions (Signed)
Please notify Dr Skyelar Swigart at phone number [phone] if you notice vaginal bleeding, new pelvic or abdominal pains, bloating, feeling full easy, or a change in bladder or bowel function.   Please contact Dr Tatsuya Okray's office (at [phone]) in September, 2020 to request an appointment with her for December, 2020.  

## 2018-09-21 NOTE — Progress Notes (Signed)
GYN Karina Briggs  Assessment:    58 y.o. 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 S/p definitive therapy with chemotherapy and radiation. Complete clinical response  Plan:  Recommend follow--up with me in 6 months until 02/2021  HPI:  Karina Briggs is a 58 y.o. year old initially seen in consultation on 09/26/14 referred by Dr Nori Riis for grade 1 endometrial cancer.  She then underwent a robotic hysterectomy, BSO, and bilateral sentinel lymph node biopsy on 4/40/34 without complications.  Her postoperative course was uncomplicated.  Her final pathologic diagnosis is a Stage IA Grade 1 endometrioid endometrial cancer with no lymphovascular space invasion, 5/28 mm (25%) of myometrial invasion and negative lymph nodes.  She had a history of postcoital spotting since March, 2017. She saw Dr Nori Riis for routine surveillance exam in may, 2017 and a pap smear was taken which showed endometrial cells and a second showed endometrial cells. As response to this a colposcopy was performed and revealed a nodule at the right vaginal cuff which was biopsied on 10/02/15 which confirmed adenocarcinoma similar to her previous endometrial cancer.   Imaging confirmed that the recurrence was localized to the vaginal cuff with extension into the deeper paracolpos tissues on the right (2.3cm).  She completed external beam radiation with 45Gy in 25 fractions to the pelvis with a vaginal boost of 5.4 Gy n 3 fractions between 11/02/15 and 12/12/15, with 5 cycles of radiosensitizing CDDP. On 12/28/15 she commenced vaginal cuff boost with HDR therapy (6Gy x 2).  She has done well with no enduring toxicities beyond therapy.  CT imaging on 02/13/16 (post- therapy) showed no new metastases or progression. There is a 1.6x2.1cm unenhanced asymmetric tissue density to the right of the vaginal cuff which is decreased in size from  pretreatment dimensions (2.0x2.3).  Repeat CT imaging in February, 2018 showed continued decrease in size of right vaginal cuff mass.  Interval Hx: PET/CT on 12/24/16 showed no residual soft tissue abnormalities and no hypermetabolic areas. She is feeling very well with no vaginal bleeding, no pelvic pain, no concerns. She does have some insensate occasional leakage of urine (not with stress or urge), for which she wears a pantiliner.  Her son is getting married this weekend.   Today we discussed survivorship issues including diet and exercise and emotional wellbeing.    Past Medical History:  Diagnosis Date  . Anemia    hx of with pregnancy  . Dizziness - light-headed 02/2010   treated by ENT and neurology  . endometrial ca dx'd 10/2015   endometrial  . GERD (gastroesophageal reflux disease)   . Headache    occasionally  . Heart murmur    hx of with pregnancy  . History of radiation therapy 11/02/2015-12/12/2015   external beam to pelvis  . Smoking hx    quit in 2016, smoked on and off for 20 years, light smoker   Past Surgical History:  Procedure Laterality Date  . DILATION AND CURETTAGE, DIAGNOSTIC / THERAPEUTIC  1990  . ROBOTIC ASSISTED TOTAL HYSTERECTOMY WITH BILATERAL SALPINGO OOPHERECTOMY Bilateral 11/01/2014   Procedure: ROBOTIC ASSISTED TOTAL LAPARSCOPIC  HYSTERECTOMY WITH BILATERAL SALPINGO [place] NODE MAPPING, LYMPHADENECTOMY;  Surgeon: [name], MD;  Location: [facility];  Service: Gynecology;  Laterality: Bilateral;  . TONSILLECTOMY     Family History  Problem Relation Age of Onset  . Hypertension Mother   . Arthritis Mother   . Alcohol  abuse Neg Hx   . Cancer Neg Hx   . COPD Neg Hx   . Depression Neg Hx   . Diabetes Neg Hx   . Early death Neg Hx   . Heart disease Neg Hx   . Hyperlipidemia Neg Hx   . Stroke Neg Hx    Social History   Socioeconomic History  . Marital status: Married    Spouse name: Not on file  . Number of children: 2  .  Years of education: Not on file  . Highest education level: Not on file  Occupational History  . Occupation: bookkeeper  Scientific laboratory technician  . Financial resource strain: Not on file  . Food insecurity    Worry: Not on file    Inability: Not on file  . Transportation needs    Medical: Not on file    Non-medical: Not on file  Tobacco Use  . Smoking status: Former Smoker    Packs/day: 0.25    Years: 30.00    Pack years: 7.50    Types: Cigarettes    Quit date: 10/27/2015    Years since quitting: 2.9  . Smokeless tobacco: Never Used  Substance and Sexual Activity  . Alcohol use: No    Alcohol/week: 0.0 standard drinks  . Drug use: No  . Sexual activity: Not Currently    Birth control/protection: Post-menopausal  Lifestyle  . Physical activity    Days per week: Not on file    Minutes per session: Not on file  . Stress: Not on file  Relationships  . Social Herbalist on phone: Not on file    Gets together: Not on file    Attends religious service: Not on file    Active member of club or organization: Not on file    Attends meetings of clubs or organizations: Not on file    Relationship status: Not on file  . Intimate partner violence    Fear of current or ex partner: Not on file    Emotionally abused: Not on file    Physically abused: Not on file    Forced sexual activity: Not on file  Other Topics Concern  . 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 loratadine (CLARITIN) 10 MG tablet Take 10 mg by mouth daily.    . pantoprazole (PROTONIX) 20 MG tablet TAKE 1 TABLET BY MOUTH EVERY DAY 30 tablet 3   No current facility-administered medications on  file prior to visit.    Allergies  Allergen Reactions  . Penicillins     Childhood reaction     Review of systems: Constitutional:  She has no weight gain or weight loss. She has no fever or chills. Eyes: No blurred vision Ears, Nose, Mouth, Throat: No dizziness, headaches or changes in hearing. No mouth sores. Cardiovascular: No chest pain, palpitations or edema. Respiratory:  No shortness of breath, wheezing or cough Gastrointestinal: She has normal bowel movements without diarrhea or constipation. She denies any nausea or vomiting. She denies blood in her stool or heart burn. Genitourinary:  She denies pelvic pain, pelvic pressure. + occasional insensate leakage of small volume  urine (not classic stress incontinence). She has no hematuria, dysuria, or incontinence. No vaginal spotting Musculoskeletal: Denies muscle weakness or joint pains.  Skin:  She has no skin changes, rashes or itching Neurological:  Denies dizziness or headaches. No neuropathy, no numbness or tingling. Psychiatric:  She denies depression or anxiety. Hematologic/Lymphatic:   No easy bruising or bleeding   Physical Exam: Blood pressure (!) 108/92, pulse 80, temperature 98 F (36.7 C), height 5' 2.5" (1.588 m), weight 187 lb (84.8 kg), last menstrual period 08/01/2011, SpO2 100 %. General: Well dressed, well nourished in no apparent distress.   HEENT:  Normocephalic and atraumatic, no lesions.  Extraocular muscles intact. Sclerae anicteric. Pupils equal, round, reactive. No mouth sores or ulcers. Thyroid is normal size, not nodular, midline. Skin:  No lesions or rashes. Breasts:  deferred Lungs:  deferred Cardiovascular: deferred Abdomen:  Soft, nontender, nondistended.  No palpable masses.  No hepatosplenomegaly.  No ascites. Normal bowel sounds.  No hernias.  Incisions are soft and healed. Genitourinary: Normal EGBUS  Vaginal cuff is smooth with no palpable or visible lesions. There are no deeper masses  appreciated. There is some petechial changes to the mucosa in the left fornix, but no visible or palpable lesions. Extremities: No cyanosis, clubbing or edema.  No calf tenderness or erythema. No palpable cords. Psychiatric: Mood and affect are appropriate. Neurological: Awake, alert and oriented x 3. Sensation is intact, no neuropathy.  Musculoskeletal: No pain, normal strength and range of motion.  Thereasa Solo, MD

## 2018-12-10 ENCOUNTER — Other Ambulatory Visit: Payer: Self-pay | Admitting: Family Medicine

## 2018-12-10 DIAGNOSIS — K219 Gastro-esophageal reflux disease without esophagitis: Secondary | ICD-10-CM

## 2019-02-01 ENCOUNTER — Encounter: Payer: BLUE CROSS/BLUE SHIELD | Admitting: Family Medicine

## 2019-02-10 ENCOUNTER — Ambulatory Visit (INDEPENDENT_AMBULATORY_CARE_PROVIDER_SITE_OTHER): Payer: BC Managed Care – PPO | Admitting: Family Medicine

## 2019-02-10 ENCOUNTER — Other Ambulatory Visit: Payer: Self-pay

## 2019-02-10 ENCOUNTER — Encounter: Payer: Self-pay | Admitting: Family Medicine

## 2019-02-10 DIAGNOSIS — Z8542 Personal history of malignant neoplasm of other parts of uterus: Secondary | ICD-10-CM

## 2019-02-10 DIAGNOSIS — K219 Gastro-esophageal reflux disease without esophagitis: Secondary | ICD-10-CM

## 2019-02-10 MED ORDER — PANTOPRAZOLE SODIUM 20 MG PO TBEC: 20.0000 mg | DELAYED_RELEASE_TABLET | Freq: Every day | ORAL | 1 refills | Status: DC

## 2019-02-10 NOTE — Progress Notes (Signed)
Virtual Visit via Video Note  I connected with Karina Briggs   on 02/10/19 at  2:30 PM EST by a video enabled telemedicine application and verified that I am speaking with the correct person using two identifiers.  Location patient: home Location provider:work office Persons participating in the virtual visit: patient, provider  I discussed the limitations of evaluation and management by telemedicine and the availability of in person appointments. The patient expressed understanding and agreed to proceed.   Karina Briggs DOB: 03/02/1961 Encounter date: 02/10/2019  This is a 58 y.o. female who presents to establish care. Chief Complaint  Patient presents with  . Establish Care    History of present illness: No specific concerns today just med refill.   Endometrial cancer: following q 6 months with gyn onc.   GERD: needs refill on the protonix. Last year Dr. Maudie Mercury was able to reduce from 40mg  to 20mg . Chemo messed up stomach. Doing well with the 20mg .   Headache: states not really ever getting headaches.   Hx of dizziness - wasn't well hydrated, but does well now that staying hydrated.   Last mammogram was 2019. They had called this summer to schedule her for this year but she put off due to [place]. [facility] with [facility] Milta Deiters.    Past Medical History:  Diagnosis Date  . Anemia    hx of with pregnancy  . Dizziness - light-headed 02/2010   treated by ENT and neurology  . endometrial ca dx'd 10/2015   endometrial  . GERD (gastroesophageal reflux disease)   . Headache    occasionally  . Heart murmur    hx of with pregnancy  . History of radiation therapy 11/02/2015-12/12/2015   external beam to pelvis  . Smoking hx    quit in 2016, smoked on and off for 20 years, light smoker   Past Surgical History:  Procedure Laterality Date  . DILATION AND CURETTAGE, DIAGNOSTIC / THERAPEUTIC  1990  . ROBOTIC ASSISTED TOTAL HYSTERECTOMY WITH BILATERAL SALPINGO  OOPHERECTOMY Bilateral 11/01/2014   Procedure: ROBOTIC ASSISTED TOTAL LAPARSCOPIC  HYSTERECTOMY WITH BILATERAL SALPINGO [place] NODE MAPPING, LYMPHADENECTOMY;  Surgeon: [name], MD;  Location: [facility];  Service: Gynecology;  Laterality: Bilateral;  . TONSILLECTOMY     Allergies  Allergen Reactions  . Penicillins     Childhood reaction   Current Meds  Medication Sig  . loratadine (CLARITIN) 10 MG tablet Take 10 mg by mouth daily.  . pantoprazole (PROTONIX) 20 MG tablet Take 1 tablet (20 mg total) by mouth daily.  . [DISCONTINUED] pantoprazole (PROTONIX) 20 MG tablet TAKE 1 TABLET BY MOUTH EVERY DAY   Social History   Tobacco Use  . Smoking status: Former Smoker    Packs/day: 0.25    Years: 30.00    Pack years: 7.50    Types: Cigarettes    Quit date: 10/27/2015    Years since quitting: 3.2  . Smokeless tobacco: Never Used  Substance Use Topics  . Alcohol use: No    Alcohol/week: 0.0 standard drinks   Family History  Problem Relation Age of Onset  . Hypertension Mother   . Arthritis Mother   . Heart failure Mother 28  . Pancreatic disease Father        uncertain if cancer or infection  . Diabetes Maternal Grandmother 36  . Dementia Paternal Grandmother 1  . Prostate cancer Paternal Grandfather   . Diabetes Brother   . Alcohol abuse Neg  Hx   . Cancer Neg Hx   . COPD Neg Hx   . Depression Neg Hx   . Early death Neg Hx   . Heart disease Neg Hx   . Hyperlipidemia Neg Hx   . Stroke Neg Hx      Review of Systems  Constitutional: Negative for chills, fatigue and fever.  Respiratory: Negative for cough, chest tightness, shortness of breath and wheezing.   Cardiovascular: Negative for chest pain, palpitations and leg swelling.    Objective:  LMP 08/01/2011       BP Readings from Last 3 Encounters:  09/21/18 (!) 108/92  01/26/18 112/70  01/05/18 (!) 109/59   Wt Readings from Last 3 Encounters:  09/21/18 187 lb (84.8 kg)  01/26/18 179 lb 12.8 oz  (81.6 kg)  01/05/18 180 lb 4 oz (81.8 kg)    EXAM:  GENERAL: alert, oriented, appears well and in no acute distress  HEENT: atraumatic, conjunctiva clear, no obvious abnormalities on inspection of external nose and ears  NECK: normal movements of the head and neck  LUNGS: on inspection no signs of respiratory distress, breathing rate appears normal, no obvious gross SOB, gasping or wheezing  CV: no obvious cyanosis  MS: moves all visible extremities without noticeable abnormality  PSYCH/NEURO: pleasant and cooperative, no obvious depression or anxiety, speech and thought processing grossly intact  SKIN: No facial or neck abnormalities noted.  Assessment/Plan  1. Gastroesophageal reflux disease without esophagitis Has remained well controlled even on decrease of Protonix from 40 to 20 mg.  I suggested breaking in half and trying 10 mg daily and then decreasing further if tolerated.  She will try this. - pantoprazole (PROTONIX) 20 MG tablet; Take 1 tablet (20 mg total) by mouth daily.  Dispense: 90 tablet; Refill: 1  2. History of endometrial cancer Following with Dr. Denman George.  Status post definitive therapy with chemo and radiation.   *Patient states she had a Cologuard last year, but I do not see results.  I have asked Wendie Simmer to check behind me and see if she can see these.  If not, will request previous colonoscopy report from Dr. Earlean Shawl.  Patient states colonoscopy was normal and has no family history of colon cancer, but it was documented that she come back for 5-year follow-up.  I discussed the assessment and treatment plan with the patient. The patient was provided an opportunity to ask questions and all were answered. The patient agreed with the plan and demonstrated an understanding of the instructions.   The patient was advised to call back or seek an in-person evaluation if the symptoms worsen or if the condition fails to improve as anticipated.  I provided 28 minutes of  non-face-to-face time during this encounter.  No follow-ups on file.  Micheline Rough, MD

## 2019-02-15 ENCOUNTER — Telehealth: Payer: Self-pay | Admitting: *Deleted

## 2019-02-15 NOTE — Telephone Encounter (Signed)
Per Clair Gulling at [employer], they do not have record of any results by the pts name.  Left a message for the pt to return a call to the office and CRM also created.

## 2019-02-15 NOTE — Telephone Encounter (Signed)
-----   Message from Caren Macadam, MD sent at 02/10/2019  3:06 PM EST ----- Favor: patient states she completed cologuard last year? I don't see results but might be missing. Can you check behind me? And if you can't find can we request from them? Other question is Dr. Earlean Shawl was recalling for colonoscopy but we don't have report on file (just data entered that I can see) and patient states that all was normal; so not sure why they are repeating in 5 years? If we can't find cologuard data she needs Med[facility]'s number so that she can check with him about necessity of repeat colonoscopy now versus 10 year for "normal" colonoscopy.

## 2019-02-18 NOTE — Telephone Encounter (Signed)
I called the pt and informed her of the message below.  Advised she contact Dr [facility] office for recommendations for repeat testing and ask that they send a copy of the report to our office and she agreed.

## 2019-02-18 NOTE — Telephone Encounter (Signed)
Pt was calling back for results, office stating are returning call pt hung up after holding

## 2019-04-21 ENCOUNTER — Encounter: Payer: Self-pay | Admitting: Family Medicine

## 2019-04-29 NOTE — Progress Notes (Signed)
GYN Karina Briggs  Assessment:   59 y.o. 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 S/p definitive therapy with chemotherapy and radiation. Complete clinical response.  Plan: Recommend follow--up with me in 6 months until 02/2021  HPI:  Karina Briggs is a 59 y.o. year old initially seen in consultation on 09/26/14 referred by Dr Nori Riis for grade 1 endometrial cancer.  She then underwent a robotic hysterectomy, BSO, and bilateral sentinel lymph node biopsy on [id number] without complications.  Her postoperative course was uncomplicated.  Her final pathologic diagnosis is a Stage IA Grade 1 endometrioid endometrial cancer with no lymphovascular space invasion, 5/28 mm (25%) of myometrial invasion and negative lymph nodes.  She had a history of postcoital spotting since March, 2017. She saw Dr Nori Riis for routine surveillance exam in may, 2017 and a pap smear was taken which showed endometrial cells and a second showed endometrial cells. As response to this a colposcopy was performed and revealed a nodule at the right vaginal cuff which was biopsied on 10/02/15 which confirmed adenocarcinoma similar to her previous endometrial cancer.   Imaging confirmed that the recurrence was localized to the vaginal cuff with extension into the deeper paracolpos tissues on the right (2.3cm).  She completed external beam radiation with 45Gy in 25 fractions to the pelvis with a vaginal boost of 5.4 Gy n 3 fractions between 11/02/15 and 12/12/15, with 5 cycles of radiosensitizing CDDP. On 12/28/15 she commenced vaginal cuff boost with HDR therapy (6Gy x 2).  She has done well with no enduring toxicities beyond therapy.  CT imaging on 02/13/16 (post- therapy) showed no new metastases or progression. There is a 1.6x2.1cm unenhanced asymmetric tissue density to the right of the vaginal cuff which is decreased in size from pretreatment  dimensions (2.0x2.3).  Repeat CT imaging in February, 2018 showed continued decrease in size of right vaginal cuff mass.  Interval Hx: PET/CT on 12/24/16 showed no residual soft tissue abnormalities and no hypermetabolic areas. She is feeling very well with no vaginal bleeding, no pelvic pain, no concerns. She does have some insensate occasional leakage of urine (not with stress or urge), for which she wears a pantiliner.  Her son was married in early 2020.  Today we discussed survivorship issues including diet and exercise and emotional wellbeing.    Past Medical History:  Diagnosis Date  . Anemia    hx of with pregnancy  . Dizziness - light-headed 02/2010   treated by ENT and neurology  . endometrial ca dx'd 10/2015   endometrial  . GERD (gastroesophageal reflux disease)   . Headache    occasionally  . Heart murmur    hx of with pregnancy  . History of radiation therapy 11/02/2015-12/12/2015   external beam to pelvis  . Smoking hx    quit in 2016, smoked on and off for 20 years, light smoker   Past Surgical History:  Procedure Laterality Date  . DILATION AND CURETTAGE, DIAGNOSTIC / THERAPEUTIC  1990  . ROBOTIC ASSISTED TOTAL HYSTERECTOMY WITH BILATERAL SALPINGO OOPHERECTOMY Bilateral 11/01/2014   Procedure: ROBOTIC ASSISTED TOTAL LAPARSCOPIC  HYSTERECTOMY WITH BILATERAL SALPINGO [place] NODE MAPPING, LYMPHADENECTOMY;  Surgeon: [name], MD;  Location: [facility];  Service: Gynecology;  Laterality: Bilateral;  . TONSILLECTOMY     Family History  Problem Relation Age of Onset  . Hypertension Mother   . Arthritis Mother   . Heart failure Mother 54  .  Pancreatic disease Father        uncertain if cancer or infection  . Diabetes Maternal Grandmother 66  . Dementia Paternal Grandmother 37  . Prostate cancer Paternal Grandfather   . Diabetes Brother   . Alcohol abuse Neg Hx   . Cancer Neg Hx   . COPD Neg Hx   . Depression Neg Hx   . Early death Neg Hx   . Heart  disease Neg Hx   . Hyperlipidemia Neg Hx   . Stroke Neg Hx    Social History   Socioeconomic History  . Marital status: Married    Spouse name: Not on file  . Number of children: 2  . Years of education: Not on file  . Highest education level: Not on file  Occupational History  . Occupation: bookkeeper  Tobacco Use  . Smoking status: Former Smoker    Packs/day: 0.25    Years: 30.00    Pack years: 7.50    Types: Cigarettes    Quit date: 10/27/2015    Years since quitting: 3.5  . Smokeless tobacco: Never Used  Substance and Sexual Activity  . Alcohol use: No    Alcohol/week: 0.0 standard drinks  . Drug use: No  . Sexual activity: Not Currently    Birth control/protection: Post-menopausal  Other Topics Concern  . Not on file  Social History Narrative   Work or School:Partially retired, bookkeeper for [employer] with husband, 2 grandchildren      Spiritual Beliefs:Catholic      Lifestyle:plans to start a healthier diet and regular exercise in 2018   Social Determinants of Health   Financial Resource Strain:   . Difficulty of Paying Living Expenses: Not on file  Food Insecurity:   . Worried About Charity fundraiser in the Last Year: Not on file  . Ran Out of Food in the Last Year: Not on file  Transportation Needs:   . Lack of Transportation (Medical): Not on file  . Lack of Transportation (Non-Medical): Not on file  Physical Activity:   . Days of Exercise per Week: Not on file  . Minutes of Exercise per Session: Not on file  Stress:   . Feeling of Stress : Not on file  Social Connections:   . Frequency of Communication with Friends and Family: Not on file  . Frequency of Social Gatherings with Friends and Family: Not on file  . Attends Religious Services: Not on file  . Active Member of Clubs or Organizations: Not on file  . Attends Archivist Meetings: Not on file  . Marital Status: Not on file  Intimate Partner  Violence:   . Fear of Current or Ex-Partner: Not on file  . Emotionally Abused: Not on file  . Physically Abused: Not on file  . Sexually Abused: Not on file   Current Outpatient Medications on File Prior to Visit  Medication Sig Dispense Refill  . loratadine (CLARITIN) 10 MG tablet Take 10 mg by mouth daily.    . pantoprazole (PROTONIX) 20 MG tablet Take 1 tablet (20 mg total) by mouth daily. 90 tablet 1   No current facility-administered medications on file prior to visit.   Allergies  Allergen Reactions  . Penicillins     Childhood reaction     Review of systems: Constitutional:  She has no weight gain or weight loss. She has no fever or chills. Eyes: No blurred vision Ears, Nose, Mouth,  Throat: No dizziness, headaches or changes in hearing. No mouth sores. Cardiovascular: No chest pain, palpitations or edema. Respiratory:  No shortness of breath, wheezing or cough Gastrointestinal: She has normal bowel movements without diarrhea or constipation. She denies any nausea or vomiting. She denies blood in her stool or heart burn. Genitourinary:  She denies pelvic pain, pelvic pressure. + occasional insensate leakage of small volume urine (not classic stress incontinence). She has no hematuria, dysuria, or incontinence. No vaginal spotting Musculoskeletal: Denies muscle weakness or joint pains.  Skin:  She has no skin changes, rashes or itching Neurological:  Denies dizziness or headaches. No neuropathy, no numbness or tingling. Psychiatric:  She denies depression or anxiety. Hematologic/Lymphatic:   No easy bruising or bleeding   Physical Exam: Blood pressure 114/78, pulse 71, temperature 98.3 F (36.8 C), temperature source Temporal, resp. rate 20, height 5' 2.5" (1.588 m), weight 190 lb (86.2 kg), last menstrual period 08/01/2011, SpO2 100 %. General: Well dressed, well nourished in no apparent distress.   HEENT:  Normocephalic and atraumatic, no lesions.  Extraocular muscles  intact. Sclerae anicteric. Pupils equal, round, reactive. No mouth sores or ulcers. Thyroid is normal size, not nodular, midline. Skin:  No lesions or rashes. Breasts:  deferred Lungs:  deferred Cardiovascular: deferred Abdomen:  Soft, nontender, nondistended.  No palpable masses.  No hepatosplenomegaly.  No ascites. Normal bowel sounds.  No hernias.  Incisions are soft and healed. Genitourinary: Normal EGBUS  Vaginal cuff is smooth with no palpable or visible lesions. There are no deeper masses appreciated. There is some petechial changes to the mucosa in the left fornix, but no visible or palpable lesions. Extremities: No cyanosis, clubbing or edema.  No calf tenderness or erythema. No palpable cords. Psychiatric: Mood and affect are appropriate. Neurological: Awake, alert and oriented x 3. Sensation is intact, no neuropathy.  Musculoskeletal: No pain, normal strength and range of motion.  Thereasa Solo, MD

## 2019-05-03 ENCOUNTER — Encounter: Payer: Self-pay | Admitting: Gynecologic Oncology

## 2019-05-03 ENCOUNTER — Inpatient Hospital Stay: Payer: BC Managed Care – PPO | Attending: Gynecologic Oncology | Admitting: Gynecologic Oncology

## 2019-05-03 ENCOUNTER — Other Ambulatory Visit: Payer: Self-pay

## 2019-05-03 VITALS — BP 114/78 | HR 71 | Temp 98.3°F | Resp 20 | Ht 62.5 in | Wt 190.0 lb

## 2019-05-03 DIAGNOSIS — Z9221 Personal history of antineoplastic chemotherapy: Secondary | ICD-10-CM | POA: Diagnosis not present

## 2019-05-03 DIAGNOSIS — Z90710 Acquired absence of both cervix and uterus: Secondary | ICD-10-CM | POA: Insufficient documentation

## 2019-05-03 DIAGNOSIS — Z8542 Personal history of malignant neoplasm of other parts of uterus: Secondary | ICD-10-CM | POA: Insufficient documentation

## 2019-05-03 DIAGNOSIS — Z90722 Acquired absence of ovaries, bilateral: Secondary | ICD-10-CM | POA: Insufficient documentation

## 2019-05-03 DIAGNOSIS — Z08 Encounter for follow-up examination after completed treatment for malignant neoplasm: Secondary | ICD-10-CM | POA: Diagnosis not present

## 2019-05-03 DIAGNOSIS — C7982 Secondary malignant neoplasm of genital organs: Secondary | ICD-10-CM

## 2019-05-03 DIAGNOSIS — Z8541 Personal history of malignant neoplasm of cervix uteri: Secondary | ICD-10-CM

## 2019-05-03 DIAGNOSIS — R011 Cardiac murmur, unspecified: Secondary | ICD-10-CM | POA: Insufficient documentation

## 2019-05-03 DIAGNOSIS — C541 Malignant neoplasm of endometrium: Secondary | ICD-10-CM

## 2019-05-03 DIAGNOSIS — Z87891 Personal history of nicotine dependence: Secondary | ICD-10-CM | POA: Diagnosis not present

## 2019-05-03 DIAGNOSIS — K219 Gastro-esophageal reflux disease without esophagitis: Secondary | ICD-10-CM | POA: Insufficient documentation

## 2019-05-03 DIAGNOSIS — Z923 Personal history of irradiation: Secondary | ICD-10-CM

## 2019-05-03 NOTE — Patient Instructions (Signed)
Please notify Dr Denman George at phone number [phone] if you notice vaginal bleeding, new pelvic or abdominal pains, bloating, feeling full easy, or a change in bladder or bowel function.   Please contact Dr Serita Grit office (at [phone]) in April, 2021 to request an appointment with her for July/August, 2021.

## 2019-05-10 ENCOUNTER — Encounter: Payer: BC Managed Care – PPO | Admitting: Family Medicine

## 2019-07-16 IMAGING — CT NM PET TUM IMG RESTAG (PS) SKULL BASE T - THIGH
8 series · 22 of 25 positions shown · non-contrast
Comparison: 12/24/2016

CLINICAL DATA: Subsequent treatment strategy for endometrial
carcinoma. Vaginal recurrence. Status post radiation therapy..

EXAM:
NUCLEAR MEDICINE PET SKULL BASE TO THIGH
TECHNIQUE: 8.9 mCi F-18 FDG was injected intravenously. Full-ring PET imaging
was performed from the skull base to thigh after the radiotracer. CT
data was obtained and used for attenuation correction and anatomic
localization.
Fasting blood glucose: 95 mg/dl

[Series 3: pet sk_thigh ac · axial · 5.0mm · 4.07mm/px · z∈[-1448,-592]mm · 4 of 215 slices shown]
[im 1/215]
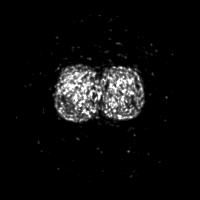
[im 72/215]
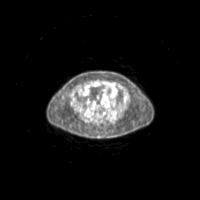
[im 143/215]
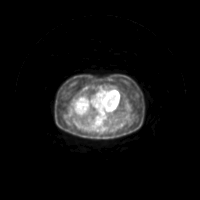
[im 215/215]
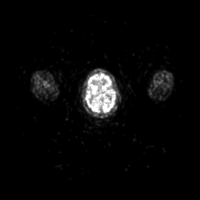

[Series 4: ct sk_thigh 5.0 b31f · axial · 5.0mm · 0.98mm/px · z∈[-1236,-592]mm · 4 of 215 slices shown]
[im 54/215]
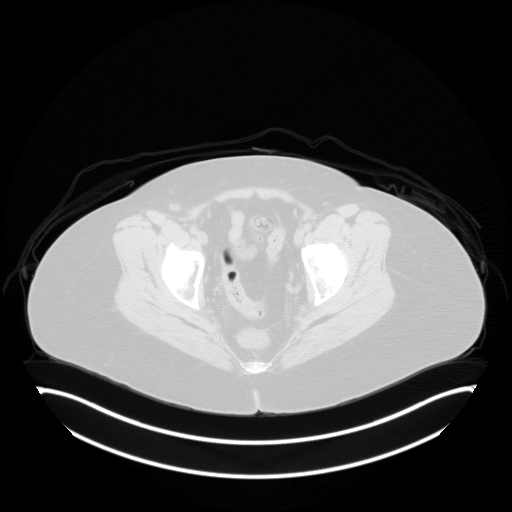
[im 108/215]
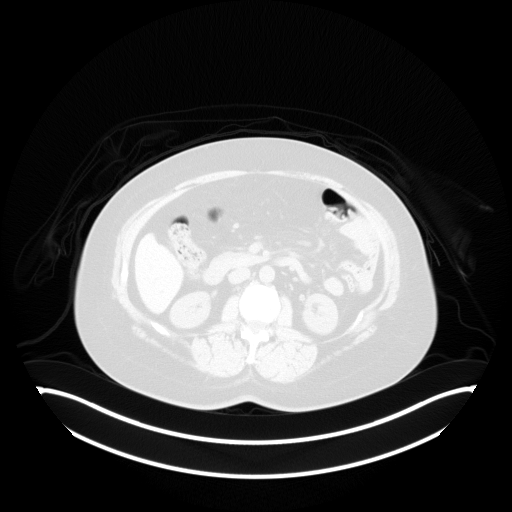
[im 161/215]
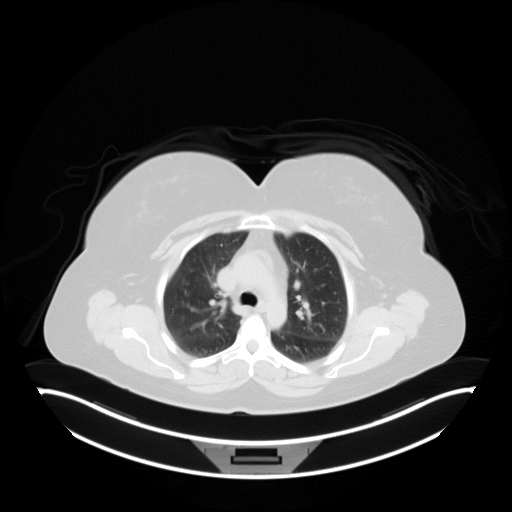
[im 215/215  brain]
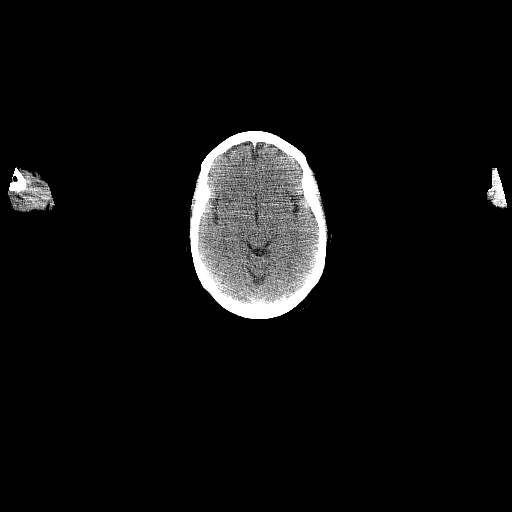

[Series 5: pet sk_thigh nac · axial · 5.0mm · 4.07mm/px · z∈[-1448,-592]mm · 4 of 215 slices shown]
[im 1/215]
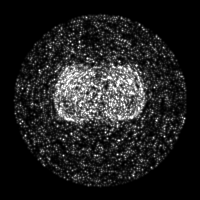
[im 54/215]
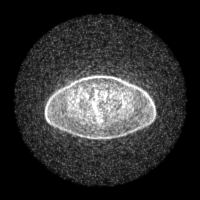
[im 108/215]
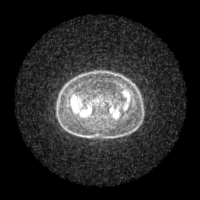
[im 215/215]
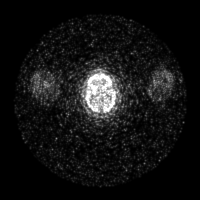

[Series 8: ct sk_thigh 5.0 b70f (id)_bone · axial · 5.0mm · 0.67mm/px · z∈[-966,-706]mm · 2 of 66 slices shown]
[im 1/66  bone]
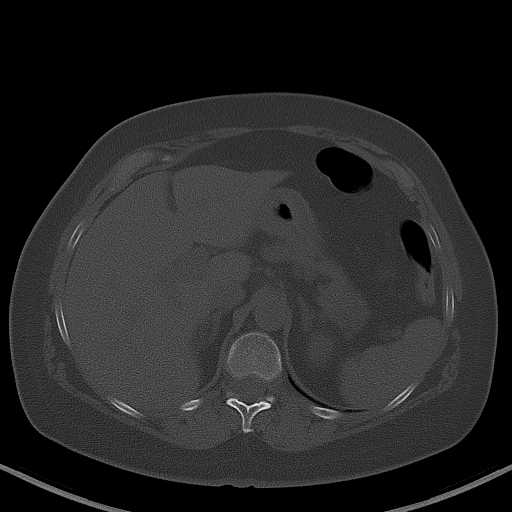
[im 66/66  bone]
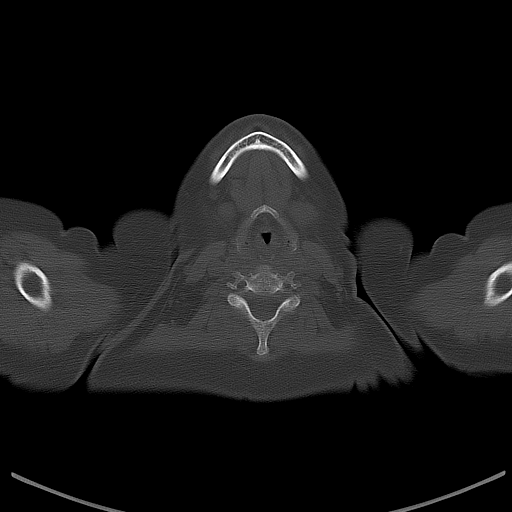

[Series 603: mip range · coronal · 1.78mm/px · 1 of 32 slices shown]
[im 1/32]
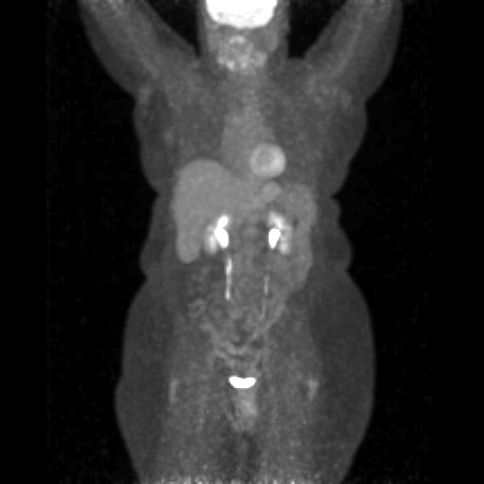

[Series 604: range-ct sk_thigh 5.0 (id)<alpha range> · 2 of 79 slices shown (1 of 2)]
[im 1/79]
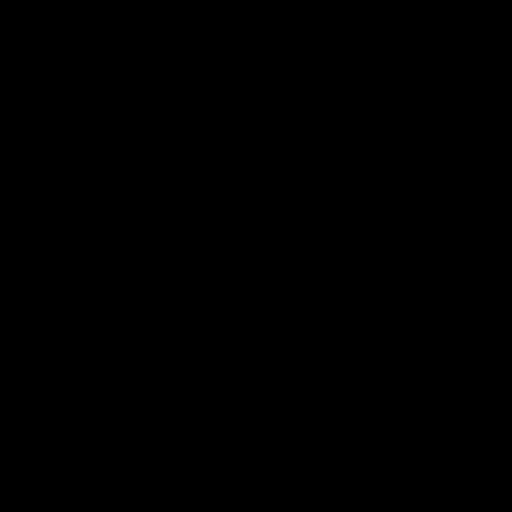
[im 79/79]
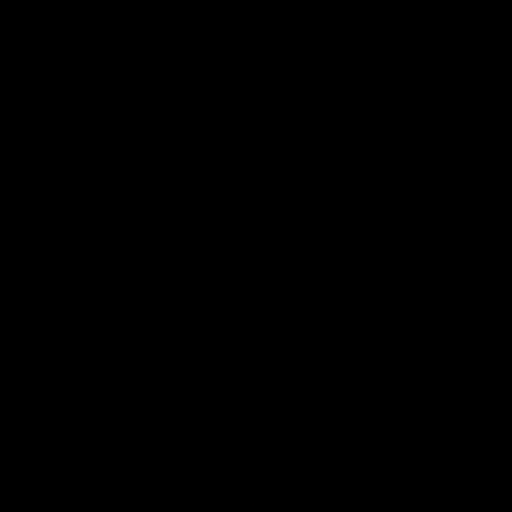

[Series 605: range-ct sk_thigh 5.0 (id)<alpha range> · 4 of 204 slices shown (2 of 2)]
[im 1/204]
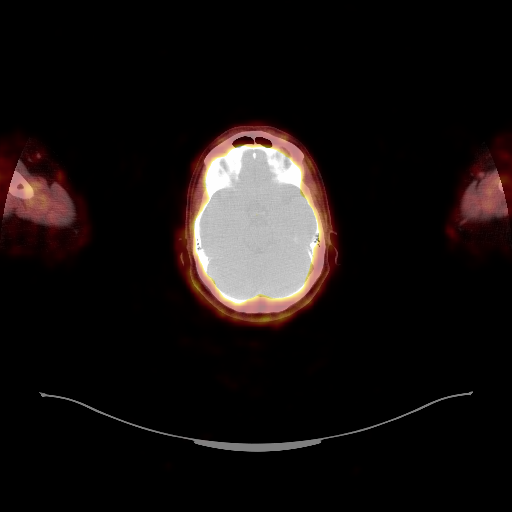
[im 102/204]
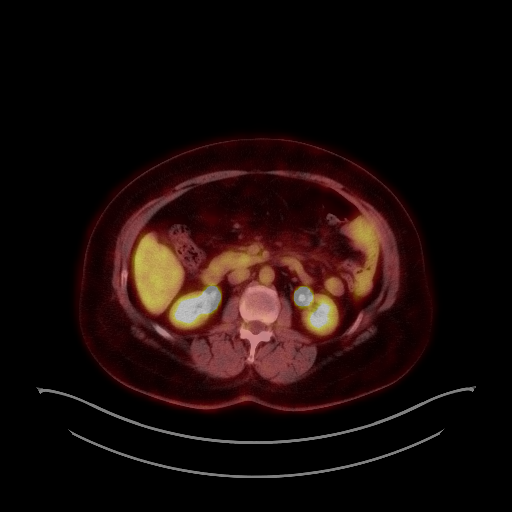
[im 153/204]
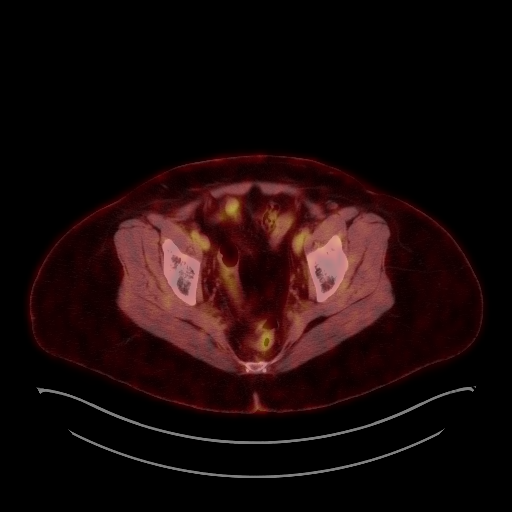
[im 204/204]
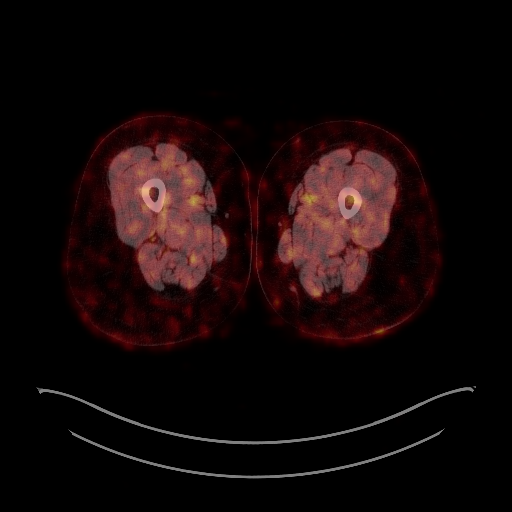

[Series 1072: results mm oncology reading · 1.0mm · 0.45mm/px · 1 of 1 slices shown]
[im 1/1]
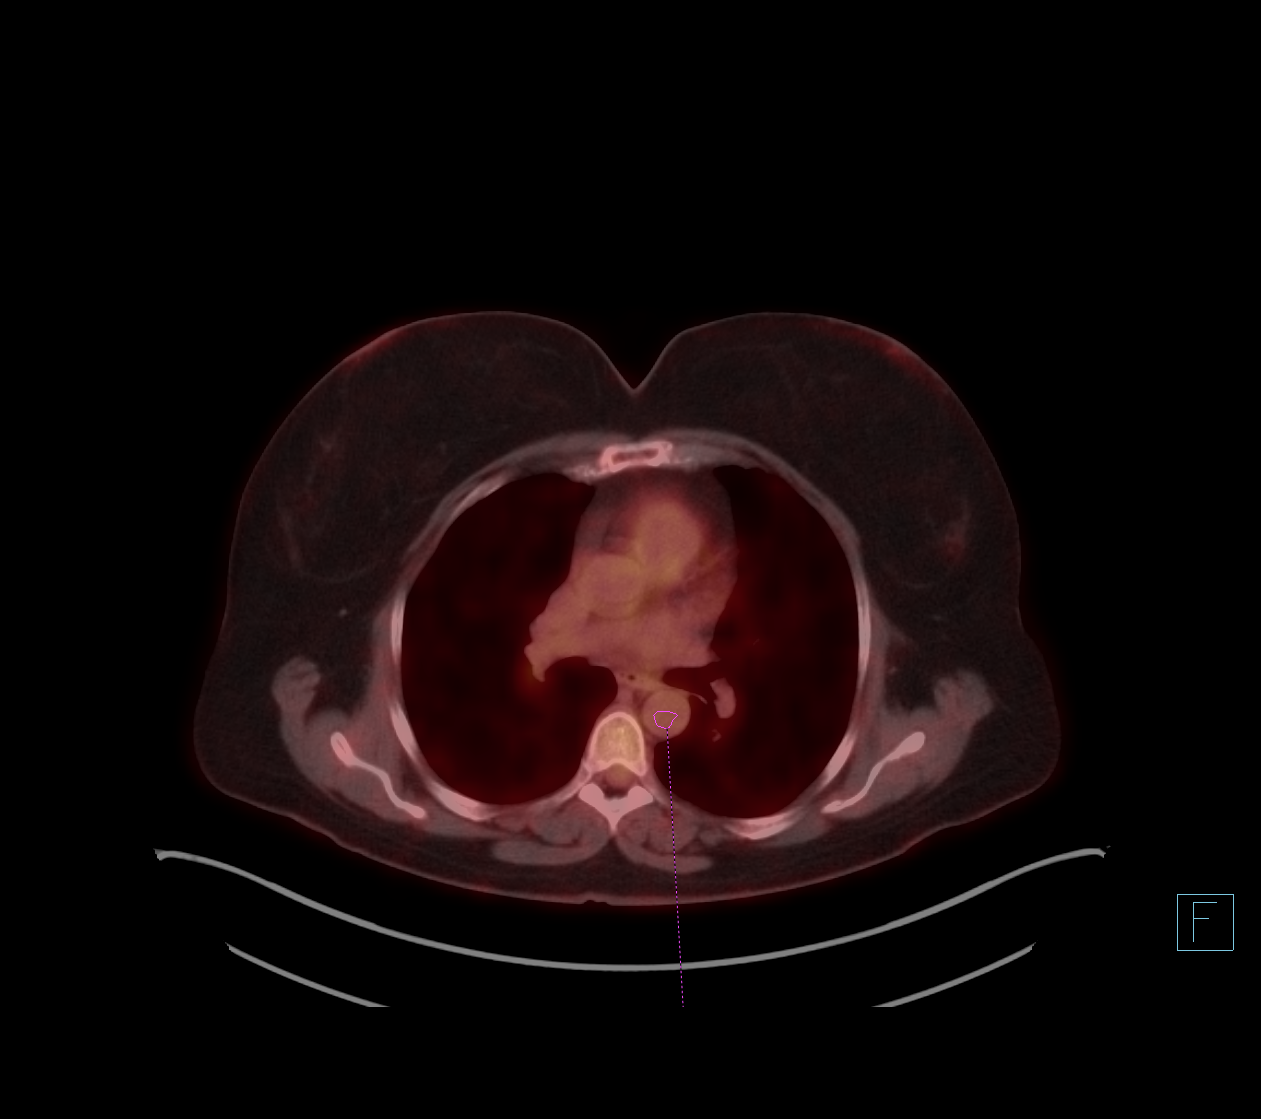

[22 of 25 positions shown; findings below may reference images not displayed]

FINDINGS: Mediastinal blood pool activity: SUV max

NECK: No hypermetabolic lymph nodes in the neck.

Incidental CT findings: none

CHEST: No hypermetabolic mediastinal or hilar nodes. No suspicious
pulmonary nodules on the CT scan.

Incidental CT findings: None

ABDOMEN/PELVIS: No abnormal hypermetabolic activity within the
liver, pancreas, adrenal glands, or spleen. No hypermetabolic lymph
nodes in the abdomen or pelvis.

Incidental CT findings: Post hysterectomy. No nodularity in the
pelvis.

SKELETON: No focal hypermetabolic activity to suggest skeletal
metastasis.

Incidental CT findings: none
IMPRESSION: 1. No evidence of endometrial carcinoma recurrence.
2. No evidence metastatic disease.
3. Post hysterectomy.

## 2019-07-27 ENCOUNTER — Other Ambulatory Visit: Payer: Self-pay | Admitting: *Deleted

## 2019-07-27 DIAGNOSIS — K219 Gastro-esophageal reflux disease without esophagitis: Secondary | ICD-10-CM

## 2019-07-27 MED ORDER — PANTOPRAZOLE SODIUM 20 MG PO TBEC: 20.0000 mg | DELAYED_RELEASE_TABLET | Freq: Every day | ORAL | 0 refills | Status: DC

## 2019-07-27 NOTE — Telephone Encounter (Signed)
Rx done. 

## 2019-09-07 DIAGNOSIS — H524 Presbyopia: Secondary | ICD-10-CM | POA: Diagnosis not present

## 2019-11-09 ENCOUNTER — Other Ambulatory Visit: Payer: Self-pay

## 2019-11-09 ENCOUNTER — Inpatient Hospital Stay: Payer: BC Managed Care – PPO | Attending: Gynecologic Oncology | Admitting: Gynecologic Oncology

## 2019-11-09 ENCOUNTER — Encounter: Payer: Self-pay | Admitting: Gynecologic Oncology

## 2019-11-09 VITALS — BP 118/80 | HR 63 | Temp 98.5°F | Resp 17 | Ht 62.5 in | Wt 184.0 lb

## 2019-11-09 DIAGNOSIS — Z90722 Acquired absence of ovaries, bilateral: Secondary | ICD-10-CM | POA: Insufficient documentation

## 2019-11-09 DIAGNOSIS — R011 Cardiac murmur, unspecified: Secondary | ICD-10-CM | POA: Diagnosis not present

## 2019-11-09 DIAGNOSIS — C541 Malignant neoplasm of endometrium: Secondary | ICD-10-CM

## 2019-11-09 DIAGNOSIS — K219 Gastro-esophageal reflux disease without esophagitis: Secondary | ICD-10-CM | POA: Insufficient documentation

## 2019-11-09 DIAGNOSIS — Z87891 Personal history of nicotine dependence: Secondary | ICD-10-CM | POA: Insufficient documentation

## 2019-11-09 DIAGNOSIS — Z9221 Personal history of antineoplastic chemotherapy: Secondary | ICD-10-CM | POA: Insufficient documentation

## 2019-11-09 DIAGNOSIS — Z923 Personal history of irradiation: Secondary | ICD-10-CM | POA: Diagnosis not present

## 2019-11-09 DIAGNOSIS — Z08 Encounter for follow-up examination after completed treatment for malignant neoplasm: Secondary | ICD-10-CM | POA: Diagnosis not present

## 2019-11-09 DIAGNOSIS — Z8542 Personal history of malignant neoplasm of other parts of uterus: Secondary | ICD-10-CM | POA: Diagnosis not present

## 2019-11-09 DIAGNOSIS — Z90710 Acquired absence of both cervix and uterus: Secondary | ICD-10-CM | POA: Insufficient documentation

## 2019-11-09 NOTE — Patient Instructions (Signed)
Please notify Dr Denman George at phone number [phone] if you notice vaginal bleeding, new pelvic or abdominal pains, bloating, feeling full easy, or a change in bladder or bowel function.   Please contact Dr Serita Grit office (at [phone]) in October, 2021 to request an appointment with her for February, 2022.

## 2019-11-09 NOTE — Progress Notes (Signed)
GYN Karina Briggs  Assessment:   59 y.o. 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 S/p definitive therapy with chemotherapy and radiation. Complete clinical response.  Plan: Recommend follow-up with me in 6 months until 02/2021.   HPI:  Karina Briggs is a 59 y.o. year old initially seen in consultation on 09/26/14 referred by Dr Nori Riis for grade 1 endometrial cancer.  She then underwent a robotic hysterectomy, BSO, and bilateral sentinel lymph node biopsy on 8/56/31 without complications.  Her postoperative course was uncomplicated.  Her final pathologic diagnosis is a Stage IA Grade 1 endometrioid endometrial cancer with no lymphovascular space invasion, 5/28 mm (25%) of myometrial invasion and negative lymph nodes.  She had a history of postcoital spotting since March, 2017. She saw Dr Nori Riis for routine surveillance exam in may, 2017 and a pap smear was taken which showed endometrial cells and a second showed endometrial cells. As response to this a colposcopy was performed and revealed a nodule at the right vaginal cuff which was biopsied on 10/02/15 which confirmed adenocarcinoma similar to her previous endometrial cancer.   Imaging confirmed that the recurrence was localized to the vaginal cuff with extension into the deeper paracolpos tissues on the right (2.3cm).  She completed external beam radiation with 45Gy in 25 fractions to the pelvis with a vaginal boost of 5.4 Gy n 3 fractions between 11/02/15 and 12/12/15, with 5 cycles of radiosensitizing CDDP. On 12/28/15 she commenced vaginal cuff boost with HDR therapy (6Gy x 2).  She has done well with no enduring toxicities beyond therapy.  CT imaging on 02/13/16 (post- therapy) showed no new metastases or progression. There is a 1.6x2.1cm unenhanced asymmetric tissue density to the right of the vaginal cuff which is decreased in size from pretreatment  dimensions (2.0x2.3).  Repeat CT imaging in February, 2018 showed continued decrease in size of right vaginal cuff mass.  Interval Hx: PET/CT on 12/24/16 showed no residual soft tissue abnormalities and no hypermetabolic areas. She is feeling very well with no vaginal bleeding, no pelvic pain, no concerns. She does have some insensate occasional leakage of urine (not with stress or urge), for which she wears a pantiliner.  Her son was married in early 2020 and her daughter will marry in May, 2022.  Past Medical History:  Diagnosis Date  . Anemia    hx of with pregnancy  . Dizziness - light-headed 02/2010   treated by ENT and neurology  . endometrial ca dx'd 10/2015   endometrial  . GERD (gastroesophageal reflux disease)   . Headache    occasionally  . Heart murmur    hx of with pregnancy  . History of radiation therapy 11/02/2015-12/12/2015   external beam to pelvis  . Smoking hx    quit in 2016, smoked on and off for 20 years, light smoker   Past Surgical History:  Procedure Laterality Date  . DILATION AND CURETTAGE, DIAGNOSTIC / THERAPEUTIC  1990  . ROBOTIC ASSISTED TOTAL HYSTERECTOMY WITH BILATERAL SALPINGO OOPHERECTOMY Bilateral 11/01/2014   Procedure: ROBOTIC ASSISTED TOTAL LAPARSCOPIC  HYSTERECTOMY WITH BILATERAL SALPINGO [place] NODE MAPPING, LYMPHADENECTOMY;  Surgeon: [name], MD;  Location: [facility];  Service: Gynecology;  Laterality: Bilateral;  . TONSILLECTOMY     Family History  Problem Relation Age of Onset  . Hypertension Mother   . Arthritis Mother   . Heart failure Mother 62  . Pancreatic disease Father  uncertain if cancer or infection  . Diabetes Maternal Grandmother 45  . Dementia Paternal Grandmother 3  . Prostate cancer Paternal Grandfather   . Diabetes Brother   . Alcohol abuse Neg Hx   . Cancer Neg Hx   . COPD Neg Hx   . Depression Neg Hx   . Early death Neg Hx   . Heart disease Neg Hx   . Hyperlipidemia Neg Hx   . Stroke Neg  Hx    Social History   Socioeconomic History  . Marital status: Married    Spouse name: Not on file  . Number of children: 2  . Years of education: Not on file  . Highest education level: Not on file  Occupational History  . Occupation: bookkeeper  Tobacco Use  . Smoking status: Former Smoker    Packs/day: 0.25    Years: 30.00    Pack years: 7.50    Types: Cigarettes    Quit date: 10/27/2015    Years since quitting: 4.0  . Smokeless tobacco: Never Used  Substance and Sexual Activity  . Alcohol use: No    Alcohol/week: 0.0 standard drinks  . Drug use: No  . Sexual activity: Not Currently    Birth control/protection: Post-menopausal  Other Topics Concern  . Not on file  Social History Narrative   Work or School:Partially retired, bookkeeper for [employer] with husband, 2 grandchildren      Spiritual Beliefs:Catholic      Lifestyle:plans to start a healthier diet and regular exercise in 2018   Social Determinants of Health   Financial Resource Strain:   . Difficulty of Paying Living Expenses:   Food Insecurity:   . Worried About Charity fundraiser in the Last Year:   . Arboriculturist in the Last Year:   Transportation Needs:   . Film/video editor (Medical):   Marland Kitchen Lack of Transportation (Non-Medical):   Physical Activity:   . Days of Exercise per Week:   . Minutes of Exercise per Session:   Stress:   . Feeling of Stress :   Social Connections:   . Frequency of Communication with Friends and Family:   . Frequency of Social Gatherings with Friends and Family:   . Attends Religious Services:   . Active Member of Clubs or Organizations:   . Attends Archivist Meetings:   Marland Kitchen Marital Status:   Intimate Partner Violence:   . Fear of Current or Ex-Partner:   . Emotionally Abused:   Marland Kitchen Physically Abused:   . Sexually Abused:    Current Outpatient Medications on File Prior to Visit  Medication Sig Dispense Refill  .  loratadine (CLARITIN) 10 MG tablet Take 10 mg by mouth daily.    . pantoprazole (PROTONIX) 20 MG tablet Take 1 tablet (20 mg total) by mouth daily. 90 tablet 0   No current facility-administered medications on file prior to visit.   Allergies  Allergen Reactions  . Penicillins     Childhood reaction     Review of systems: Constitutional:  She has no weight gain or weight loss. She has no fever or chills. Eyes: No blurred vision Ears, Nose, Mouth, Throat: No dizziness, headaches or changes in hearing. No mouth sores. Cardiovascular: No chest pain, palpitations or edema. Respiratory:  No shortness of breath, wheezing or cough Gastrointestinal: She has normal bowel movements without diarrhea or constipation. She denies any nausea or vomiting. She denies blood  in her stool or heart burn. Genitourinary:  She denies pelvic pain, pelvic pressure. + occasional insensate leakage of small volume urine (not classic stress incontinence). She has no hematuria, dysuria, or incontinence. No vaginal spotting Musculoskeletal: Denies muscle weakness or joint pains.  Skin:  She has no skin changes, rashes or itching Neurological:  Denies dizziness or headaches. No neuropathy, no numbness or tingling. Psychiatric:  She denies depression or anxiety. Hematologic/Lymphatic:   No easy bruising or bleeding   Physical Exam: Blood pressure 118/80, pulse 63, temperature 98.5 F (36.9 C), temperature source Temporal, resp. rate 17, height 5' 2.5" (1.588 m), weight 184 lb (83.5 kg), last menstrual period 08/01/2011, SpO2 100 %. General: Well dressed, well nourished in no apparent distress.   HEENT:  Normocephalic and atraumatic, no lesions.  Extraocular muscles intact. Sclerae anicteric. Pupils equal, round, reactive. No mouth sores or ulcers. Thyroid is normal size, not nodular, midline. Skin:  No lesions or rashes. Breasts:  deferred Lungs:  deferred Cardiovascular: deferred Abdomen:  Soft, nontender,  nondistended.  No palpable masses.  No hepatosplenomegaly.  No ascites. Normal bowel sounds.  No hernias.  Incisions are soft and healed. Genitourinary: Normal EGBUS  Vaginal cuff is smooth with no palpable or visible lesions. There are no deeper masses appreciated. There is some petechial changes to the mucosa in the left fornix, but no visible or palpable lesions. Extremities: No cyanosis, clubbing or edema.  No calf tenderness or erythema. No palpable cords. Psychiatric: Mood and affect are appropriate. Neurological: Awake, alert and oriented x 3. Sensation is intact, no neuropathy.  Musculoskeletal: No pain, normal strength and range of motion.  Thereasa Solo, MD

## 2019-11-23 ENCOUNTER — Other Ambulatory Visit: Payer: Self-pay | Admitting: Family Medicine

## 2019-11-23 DIAGNOSIS — K219 Gastro-esophageal reflux disease without esophagitis: Secondary | ICD-10-CM

## 2019-12-01 ENCOUNTER — Ambulatory Visit: Payer: BC Managed Care – PPO | Admitting: Family Medicine

## 2020-01-06 ENCOUNTER — Other Ambulatory Visit: Payer: Self-pay

## 2020-01-07 ENCOUNTER — Encounter: Payer: Self-pay | Admitting: Family Medicine

## 2020-01-07 ENCOUNTER — Ambulatory Visit (INDEPENDENT_AMBULATORY_CARE_PROVIDER_SITE_OTHER): Payer: BC Managed Care – PPO | Admitting: Family Medicine

## 2020-01-07 VITALS — BP 122/82 | HR 72 | Temp 98.1°F | Ht 61.75 in | Wt 176.6 lb

## 2020-01-07 DIAGNOSIS — Z8542 Personal history of malignant neoplasm of other parts of uterus: Secondary | ICD-10-CM | POA: Diagnosis not present

## 2020-01-07 DIAGNOSIS — Z131 Encounter for screening for diabetes mellitus: Secondary | ICD-10-CM

## 2020-01-07 DIAGNOSIS — K219 Gastro-esophageal reflux disease without esophagitis: Secondary | ICD-10-CM

## 2020-01-07 DIAGNOSIS — Z13220 Encounter for screening for lipoid disorders: Secondary | ICD-10-CM

## 2020-01-07 DIAGNOSIS — N3942 Incontinence without sensory awareness: Secondary | ICD-10-CM | POA: Diagnosis not present

## 2020-01-07 DIAGNOSIS — Z0000 Encounter for general adult medical examination without abnormal findings: Secondary | ICD-10-CM

## 2020-01-07 DIAGNOSIS — Z23 Encounter for immunization: Secondary | ICD-10-CM

## 2020-01-07 DIAGNOSIS — R5383 Other fatigue: Secondary | ICD-10-CM | POA: Diagnosis not present

## 2020-01-07 NOTE — Progress Notes (Signed)
Karina Briggs DOB: [birth year] Encounter date: 01/07/2020  This is a 59 y.o. female who presents for complete physical   History of present illness/Additional concerns:  Last visit with me 02/2019. No specific concerns today.   Thinks a little less energetic. Feels like there is into that much to do. Walks regularly. Staying active. More down time these days. Picking back up more with volunteer activities. Eating a lower carb diet. Not sure if getting enough.   GERD: Protonix 20 mg. Doing well with this.  Seasonal allergies - hasn't been taking claritin yet.   Follows regularly with Dr. 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 S/p definitive therapy with chemotherapy and radiation. Complete clinical response.  [facility].  Didn't get cologuard results (patient states had this in 2019? Last colonoscopy with Dr. Earlean Shawl.   Past Medical History:  Diagnosis Date  . Anemia    hx of with pregnancy  . Dizziness - light-headed 02/2010   treated by ENT and neurology  . endometrial ca dx'd 10/2015   endometrial  . GERD (gastroesophageal reflux disease)   . Headache    occasionally  . Heart murmur    hx of with pregnancy  . History of radiation therapy 11/02/2015-12/12/2015   external beam to pelvis  . Smoking hx    quit in 2016, smoked on and off for 20 years, light smoker   Past Surgical History:  Procedure Laterality Date  . DILATION AND CURETTAGE, DIAGNOSTIC / THERAPEUTIC  1990  . ROBOTIC ASSISTED TOTAL HYSTERECTOMY WITH BILATERAL SALPINGO OOPHERECTOMY Bilateral 11/01/2014   Procedure: ROBOTIC ASSISTED TOTAL LAPARSCOPIC  HYSTERECTOMY WITH BILATERAL SALPINGO [place] NODE MAPPING, LYMPHADENECTOMY;  Surgeon: [name], MD;  Location: [facility];  Service: Gynecology;  Laterality: Bilateral;  . TONSILLECTOMY      Allergies  Allergen Reactions  . Penicillins     Childhood reaction   Current Meds  Medication Sig  . loratadine (CLARITIN) 10 MG tablet Take 10 mg by mouth daily.  . pantoprazole (PROTONIX) 20 MG tablet TAKE 1 TABLET BY MOUTH EVERY DAY   Social History   Tobacco Use  . Smoking status: Former Smoker    Packs/day: 0.25    Years: 30.00    Pack years: 7.50    Types: Cigarettes    Quit date: 10/27/2015    Years since quitting: 4.2  . Smokeless tobacco: Never Used  Substance Use Topics  . Alcohol use: No    Alcohol/week: 0.0 standard drinks   Family History  Problem Relation Age of Onset  . Hypertension Mother   . Arthritis Mother   . Heart failure Mother 53  . Pancreatic disease Father        uncertain if cancer or infection  . Diabetes Maternal Grandmother 68  . Dementia Paternal Grandmother 93  . Prostate cancer Paternal Grandfather   . Diabetes Brother   . Alcohol abuse Neg Hx   . Cancer Neg Hx   . COPD Neg Hx   . Depression Neg Hx   . Early death Neg Hx   . Heart disease Neg Hx   . Hyperlipidemia Neg Hx   . Stroke Neg Hx      Review of Systems  Constitutional: Negative for activity change, appetite change, chills, fatigue, fever and unexpected weight change.  HENT: Negative for congestion, ear pain, hearing loss, sinus pressure, sinus  pain, sore throat and trouble swallowing.   Eyes: Negative for pain and visual disturbance.  Respiratory: Negative for cough, chest tightness, shortness of breath and wheezing.   Cardiovascular: Negative for chest pain, palpitations and leg swelling.  Gastrointestinal: Negative for abdominal pain, blood in stool, constipation, diarrhea, nausea and vomiting.  Genitourinary: Negative for difficulty urinating and menstrual problem.  Musculoskeletal: Negative for arthralgias and back pain.  Skin: Negative for rash.  Neurological: Negative for dizziness, weakness, numbness and headaches.  Hematological: Negative for adenopathy.  Does not bruise/bleed easily.  Psychiatric/Behavioral: Negative for sleep disturbance and suicidal ideas. The patient is not nervous/anxious.     CBC:  Lab Results  Component Value Date   WBC 4.7 01/07/2020   HGB 11.8 01/07/2020   HGB 11.8 02/12/2016   HCT 36.3 01/07/2020   HCT 35.7 02/12/2016   MCH 28.5 01/07/2020   MCHC 32.5 01/07/2020   RDW 13.7 01/07/2020   RDW 15.4 (H) 02/12/2016   PLT 317 01/07/2020   PLT 228 02/12/2016   MPV 10.7 01/07/2020   CMP: Lab Results  Component Value Date   NA 140 01/07/2020   NA 143 02/12/2016   K 4.1 01/07/2020   K 4.5 02/12/2016   CL 105 01/07/2020   CO2 27 01/07/2020   CO2 27 02/12/2016   ANIONGAP 8 02/12/2016   ANIONGAP 5 11/02/2014   GLUCOSE 93 01/07/2020   GLUCOSE 116 02/12/2016   BUN 18 01/07/2020   BUN 15.1 02/12/2016   CREATININE 0.92 01/07/2020   CREATININE 0.9 02/12/2016   GFRAA >60 11/02/2014   CALCIUM 9.6 01/07/2020   CALCIUM 9.5 02/12/2016   PROT 6.8 01/07/2020   PROT 6.3 (L) 02/12/2016   BILITOT 0.5 01/07/2020   BILITOT 0.40 02/12/2016   ALKPHOS 62 02/12/2016   ALT 13 01/07/2020   ALT 16 02/12/2016   AST 12 01/07/2020   AST 15 02/12/2016   LIPID: Lab Results  Component Value Date   CHOL 200 (H) 01/07/2020   TRIG 60 01/07/2020   HDL 78 01/07/2020   LDLCALC 107 (H) 01/07/2020    Objective:  BP 122/82 (BP Location: Left Arm, Patient Position: Sitting, Cuff Size: Normal)   Pulse 72   Temp 98.1 F (36.7 C) (Oral)   Ht 5' 1.75" (1.568 m)   Wt 176 lb 9.6 oz (80.1 kg)   LMP 08/01/2011   BMI 32.56 kg/m   Weight: 176 lb 9.6 oz (80.1 kg)   BP Readings from Last 3 Encounters:  01/07/20 122/82  11/09/19 118/80  05/03/19 114/78   Wt Readings from Last 3 Encounters:  01/07/20 176 lb 9.6 oz (80.1 kg)  11/09/19 184 lb (83.5 kg)  05/03/19 190 lb (86.2 kg)    Physical Exam Constitutional:      General: She is not in acute distress.    Appearance: She is well-developed.  HENT:     Head: Normocephalic  and atraumatic.     Right Ear: External ear normal.     Left Ear: External ear normal.     Mouth/Throat:     Pharynx: No oropharyngeal exudate.  Eyes:     Conjunctiva/sclera: Conjunctivae normal.     Pupils: Pupils are equal, round, and reactive to light.  Neck:     Thyroid: No thyromegaly.  Cardiovascular:     Rate and Rhythm: Normal rate and regular rhythm.     Heart sounds: Normal heart sounds. No murmur heard.  No friction rub. No gallop.   Pulmonary:  Effort: Pulmonary effort is normal.     Breath sounds: Normal breath sounds.  Abdominal:     General: Bowel sounds are normal. There is no distension.     Palpations: Abdomen is soft. There is no mass.     Tenderness: There is no abdominal tenderness. There is no guarding.     Hernia: No hernia is present.  Musculoskeletal:        General: No tenderness or deformity. Normal range of motion.     Cervical back: Normal range of motion and neck supple.  Lymphadenopathy:     Cervical: No cervical adenopathy.  Skin:    General: Skin is warm and dry.     Findings: No rash.  Neurological:     Mental Status: She is alert and oriented to person, place, and time.     Deep Tendon Reflexes: Reflexes normal.     Reflex Scores:      Tricep reflexes are 2+ on the right side and 2+ on the left side.      Bicep reflexes are 2+ on the right side and 2+ on the left side.      Brachioradialis reflexes are 2+ on the right side and 2+ on the left side.      Patellar reflexes are 2+ on the right side and 2+ on the left side. Psychiatric:        Speech: Speech normal.        Behavior: Behavior normal.        Thought Content: Thought content normal.     Assessment/Plan: There are no preventive care reminders to display for this patient. Health Maintenance reviewed -up-to-date.  1. Preventative health care - Ambulatory referral to Gastroenterology; due for repeat colonoscopy  2. Urinary incontinence without sensory awareness We  discussed doing Kegel exercises with each urination to help strengthen urine muscles.  Even if this is not the only issue causing incontinence, it should be helpful.  3. History of endometrial cancer She does follow regularly with gynecology and gynecology.  4. Gastroesophageal reflux disease without esophagitis Well-controlled with Protonix.  5. Fatigue, unspecified type - CBC with Differential/Platelet; Future - Comprehensive metabolic panel; Future - TSH; Future - Vitamin B12; Future - VITAMIN D 25 Hydroxy (Vit-D Deficiency, Fractures); Future - VITAMIN D 25 Hydroxy (Vit-D Deficiency, Fractures) - Vitamin B12 - TSH - Comprehensive metabolic panel - CBC with Differential/Platelet  6. Lipid screening - Comprehensive metabolic panel; Future - Lipid panel; Future - Lipid panel - Comprehensive metabolic panel  7. Screening for diabetes mellitus We will get lab work today.  8. Need for immunization against influenza - Flu Vaccine QUAD 6+ mos PF IM (Fluarix Quad PF)  Return in about 1 year (around 01/06/2021) for physical exam.  Micheline Rough, MD

## 2020-01-08 LAB — LIPID PANEL
Cholesterol: 200 mg/dL — ABNORMAL HIGH (ref <200)
HDL: 78 mg/dL (ref >=50)
LDL Cholesterol (Calc): 107 mg/dL (calc) — ABNORMAL HIGH
Non-HDL Cholesterol (Calc): 122 mg/dL (calc) (ref <130)
Total CHOL/HDL Ratio: 2.6 (calc) (ref <5.0)
Triglycerides: 60 mg/dL (ref <150)

## 2020-01-08 LAB — CBC WITH DIFFERENTIAL/PLATELET
Absolute Monocytes: 442 cells/uL (ref 200–950)
Basophils Absolute: 61 cells/uL (ref 0–200)
Basophils Relative: 1.3 %
Eosinophils Absolute: 193 cells/uL (ref 15–500)
Eosinophils Relative: 4.1 %
HCT: 36.3 % (ref 35.0–45.0)
Hemoglobin: 11.8 g/dL (ref 11.7–15.5)
Lymphs Abs: 1415 cells/uL (ref 850–3900)
MCH: 28.5 pg (ref 27.0–33.0)
MCHC: 32.5 g/dL (ref 32.0–36.0)
MCV: 87.7 fL (ref 80.0–100.0)
MPV: 10.7 fL (ref 7.5–12.5)
Monocytes Relative: 9.4 %
Neutro Abs: 2590 cells/uL (ref 1500–7800)
Neutrophils Relative %: 55.1 %
Platelets: 317 10*3/uL (ref 140–400)
RBC: 4.14 10*6/uL (ref 3.80–5.10)
RDW: 13.7 % (ref 11.0–15.0)
Total Lymphocyte: 30.1 %
WBC: 4.7 10*3/uL (ref 3.8–10.8)

## 2020-01-08 LAB — COMPREHENSIVE METABOLIC PANEL
AG Ratio: 1.8 (calc) (ref 1.0–2.5)
ALT: 13 U/L (ref 6–29)
AST: 12 U/L (ref 10–35)
Albumin: 4.4 g/dL (ref 3.6–5.1)
Alkaline phosphatase (APISO): 63 U/L (ref 37–153)
BUN: 18 mg/dL (ref 7–25)
CO2: 27 mmol/L (ref 20–32)
Calcium: 9.6 mg/dL (ref 8.6–10.4)
Chloride: 105 mmol/L (ref 98–110)
Creat: 0.92 mg/dL (ref 0.50–1.05)
Globulin: 2.4 g/dL (calc) (ref 1.9–3.7)
Glucose, Bld: 93 mg/dL (ref 65–99)
Potassium: 4.1 mmol/L (ref 3.5–5.3)
Sodium: 140 mmol/L (ref 135–146)
Total Bilirubin: 0.5 mg/dL (ref 0.2–1.2)
Total Protein: 6.8 g/dL (ref 6.1–8.1)

## 2020-01-08 LAB — TSH: TSH: 1.38 mIU/L (ref 0.40–4.50)

## 2020-01-08 LAB — VITAMIN D 25 HYDROXY (VIT D DEFICIENCY, FRACTURES): Vit D, 25-Hydroxy: 30 ng/mL (ref 30–100)

## 2020-01-08 LAB — VITAMIN B12: Vitamin B-12: 313 pg/mL (ref 200–1100)

## 2020-01-12 ENCOUNTER — Telehealth: Payer: Self-pay | Admitting: Family Medicine

## 2020-01-12 NOTE — Telephone Encounter (Signed)
Patient was returning JoAnne's call

## 2020-01-12 NOTE — Telephone Encounter (Signed)
See results note. 

## 2020-01-24 DIAGNOSIS — Z01419 Encounter for gynecological examination (general) (routine) without abnormal findings: Secondary | ICD-10-CM | POA: Diagnosis not present

## 2020-01-24 DIAGNOSIS — Z6832 Body mass index (BMI) 32.0-32.9, adult: Secondary | ICD-10-CM | POA: Diagnosis not present

## 2020-01-24 DIAGNOSIS — Z13820 Encounter for screening for osteoporosis: Secondary | ICD-10-CM | POA: Diagnosis not present

## 2020-01-24 DIAGNOSIS — Z1231 Encounter for screening mammogram for malignant neoplasm of breast: Secondary | ICD-10-CM | POA: Diagnosis not present

## 2020-01-27 DIAGNOSIS — H40013 Open angle with borderline findings, low risk, bilateral: Secondary | ICD-10-CM | POA: Diagnosis not present

## 2020-02-19 ENCOUNTER — Other Ambulatory Visit: Payer: Self-pay | Admitting: Family Medicine

## 2020-02-19 DIAGNOSIS — K219 Gastro-esophageal reflux disease without esophagitis: Secondary | ICD-10-CM

## 2020-05-31 ENCOUNTER — Telehealth: Payer: Self-pay | Admitting: *Deleted

## 2020-05-31 DIAGNOSIS — H40013 Open angle with borderline findings, low risk, bilateral: Secondary | ICD-10-CM | POA: Diagnosis not present

## 2020-05-31 NOTE — Telephone Encounter (Signed)
Patient called and scheduled a follow up appt with Dr Denman George on 3/11 at 3:45 pm

## 2020-06-14 ENCOUNTER — Telehealth: Payer: Self-pay | Admitting: *Deleted

## 2020-06-14 NOTE — Telephone Encounter (Signed)
Patient called and moved her appt from 3/11 to 3/24

## 2020-06-15 ENCOUNTER — Telehealth: Payer: BC Managed Care – PPO | Admitting: Family Medicine

## 2020-06-15 ENCOUNTER — Encounter: Payer: BC Managed Care – PPO | Admitting: Family Medicine

## 2020-06-15 ENCOUNTER — Encounter: Payer: Self-pay | Admitting: Family Medicine

## 2020-06-15 DIAGNOSIS — J0100 Acute maxillary sinusitis, unspecified: Secondary | ICD-10-CM | POA: Diagnosis not present

## 2020-06-15 MED ORDER — AZITHROMYCIN 250 MG PO TABS: ORAL_TABLET | ORAL | 0 refills | Status: DC

## 2020-06-15 NOTE — Progress Notes (Signed)
Karina Briggs, markov are scheduled for a virtual visit with your provider today.    Just as we do with appointments in the office, we must obtain your consent to participate.  Your consent will be active for this visit and any virtual visit you may have with one of our providers in the next 365 days.    If you have a MyChart account, I can also send a copy of this consent to you electronically.  All virtual visits are billed to your insurance company just like a traditional visit in the office.  As this is a virtual visit, video technology does not allow for your provider to perform a traditional examination.  This may limit your provider's ability to fully assess your condition.  If your provider identifies any concerns that need to be evaluated in person or the need to arrange testing such as labs, EKG, etc, we will make arrangements to do so.    Although advances in technology are sophisticated, we cannot ensure that it will always work on either your end or our end.  If the connection with a video visit is poor, we may have to switch to a telephone visit.  With either a video or telephone visit, we are not always able to ensure that we have a secure connection.   I need to obtain your verbal consent now.   Are you willing to proceed with your visit today?   [name] has provided verbal consent on 06/15/2020 for a virtual visit (video or telephone).   [name], NP 06/15/2020  10:55 AM   Date:  06/15/2020   ID:  Karina Briggs [birth year], MRN [id number]  Patient Location: Home Provider Location: Home Office   Participants: Patient and Provider for Visit and Wrap up  Method of visit: Video  Location of Patient: Home Location of Provider: Home Office Consent was obtain for visit over the video. Services rendered by provider: Visit was performed via video  A video enabled telemedicine application was used and I verified that I am speaking with the correct person using two  identifiers.  PCP:  Caren Macadam, MD   Chief Complaint:  Sinus symptoms  History of Present Illness:    Karina Briggs is a 60 y.o. female with history as stated below. Presents video telehealth for an acute care visit secondary to sinus symptoms over the last week with worsening feeling.  Onset was last Wednesday like a runny nose/cold.  Reports that this has been persistent and is increased to having yellow mucus drainage nasally.  Development of headache across forehead.  Reports pressure changing..  Denies having a cough, Covid took 2 home test they were negative, chest pain, shortness of breath, fevers.  Has not tried anything really over-the-counter at this time.  Past Medical, Surgical, Social History, Allergies, and Medications have been Reviewed.  Past Medical History:  Diagnosis Date  . Anemia    hx of with pregnancy  . Dizziness - light-headed 02/2010   treated by ENT and neurology  . endometrial ca dx'd 10/2015   endometrial  . GERD (gastroesophageal reflux disease)   . Headache    occasionally  . Heart murmur    hx of with pregnancy  . History of radiation therapy 11/02/2015-12/12/2015   external beam to pelvis  . Smoking hx    quit in 2016, smoked on and off for 20 years, light smoker    Current Meds  Medication Sig  . azithromycin (  ZITHROMAX) 250 MG tablet Take 2 tablets 500 mg on day 1. Take 1 tablet 250mg  on days 2-5     Allergies:   Penicillins   ROS See HPI for history of present illness.  Physical Exam Constitutional:      Appearance: Normal appearance.  HENT:     Head: Normocephalic and atraumatic.     Comments: Pt palpitated over sinuses w tenderness over maxillary demonstrated     Right Ear: External ear normal.     Left Ear: External ear normal.     Nose: Nose normal.  Eyes:     Conjunctiva/sclera: Conjunctivae normal.  Pulmonary:     Comments: No cough or shortness of breath Musculoskeletal:        General: Normal range of motion.      Cervical back: Normal range of motion.  Neurological:     Mental Status: She is alert and oriented to person, place, and time.  Psychiatric:        Mood and Affect: Mood normal.        Behavior: Behavior normal.        Thought Content: Thought content normal.        Judgment: Judgment normal.               A&P  1. Acute non-recurrent maxillary sinusitis -s&s are consistent w sinus infection post viral infection -z pack provided -OTC measures reviewed and encouraged     Time:   Today, I have spent 10 minutes with the patient with telehealth technology discussing the above problems, reviewing the chart, previous notes, medications and orders.    Tests Ordered: No orders of the defined types were placed in this encounter.   Medication Changes: Meds ordered this encounter  Medications  . azithromycin (ZITHROMAX) 250 MG tablet    Sig: Take 2 tablets 500 mg on day 1. Take 1 tablet 250mg  on days 2-5    Dispense:  6 tablet    Refill:  0    Order Specific Question:   Supervising Provider    Answer:   Fayrene Helper [[id number]]     Disposition:  Follow up as needed Signed, [name], NP  06/15/2020 10:55 AM

## 2020-06-15 NOTE — Patient Instructions (Signed)

## 2020-06-16 ENCOUNTER — Ambulatory Visit: Payer: BC Managed Care – PPO | Admitting: Gynecologic Oncology

## 2020-06-16 NOTE — Progress Notes (Signed)
This encounter was created in error - please disregard.

## 2020-06-28 ENCOUNTER — Encounter: Payer: Self-pay | Admitting: Gynecologic Oncology

## 2020-06-29 ENCOUNTER — Other Ambulatory Visit: Payer: Self-pay

## 2020-06-29 ENCOUNTER — Inpatient Hospital Stay: Payer: BC Managed Care – PPO | Attending: Gynecologic Oncology | Admitting: Gynecologic Oncology

## 2020-06-29 VITALS — BP 115/75 | HR 78 | Temp 96.8°F | Resp 20 | Ht 61.0 in | Wt 173.0 lb

## 2020-06-29 DIAGNOSIS — Z9221 Personal history of antineoplastic chemotherapy: Secondary | ICD-10-CM | POA: Diagnosis not present

## 2020-06-29 DIAGNOSIS — Z08 Encounter for follow-up examination after completed treatment for malignant neoplasm: Secondary | ICD-10-CM | POA: Insufficient documentation

## 2020-06-29 DIAGNOSIS — R32 Unspecified urinary incontinence: Secondary | ICD-10-CM | POA: Insufficient documentation

## 2020-06-29 DIAGNOSIS — R011 Cardiac murmur, unspecified: Secondary | ICD-10-CM | POA: Diagnosis not present

## 2020-06-29 DIAGNOSIS — Z90722 Acquired absence of ovaries, bilateral: Secondary | ICD-10-CM | POA: Diagnosis not present

## 2020-06-29 DIAGNOSIS — Z90710 Acquired absence of both cervix and uterus: Secondary | ICD-10-CM | POA: Insufficient documentation

## 2020-06-29 DIAGNOSIS — Z923 Personal history of irradiation: Secondary | ICD-10-CM | POA: Insufficient documentation

## 2020-06-29 DIAGNOSIS — Z8542 Personal history of malignant neoplasm of other parts of uterus: Secondary | ICD-10-CM | POA: Diagnosis not present

## 2020-06-29 DIAGNOSIS — Z87891 Personal history of nicotine dependence: Secondary | ICD-10-CM | POA: Insufficient documentation

## 2020-06-29 DIAGNOSIS — Z79899 Other long term (current) drug therapy: Secondary | ICD-10-CM | POA: Insufficient documentation

## 2020-06-29 DIAGNOSIS — K219 Gastro-esophageal reflux disease without esophagitis: Secondary | ICD-10-CM | POA: Diagnosis not present

## 2020-06-29 DIAGNOSIS — C541 Malignant neoplasm of endometrium: Secondary | ICD-10-CM

## 2020-06-29 NOTE — Progress Notes (Signed)
Gynecologic Oncology Follow Up  Assessment:   60 y.o. 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 S/p definitive therapy with chemotherapy and radiation completed in 01/2016. Complete clinical response.  Plan: Recommend follow-up with Dr. Denman George in 6 months until 02/2021. Information given to the patient on urinary incontinence, kegel exercises, and pelvic floor physical therapy as potential options for treatment of her urinary incontinence. Advised to call for any questions, concerns, or new, persistent symptoms.   HPI:  Karina Briggs is a 60 y.o. year old initially seen in consultation on 09/26/14 at the request of Dr Nori Riis for newly diagnosed grade 1 endometrial cancer. During her workup for vaginal spotting, an ultrasound was performed on 08/24/2014 revealing a 9.5 x 5.3 x 5.7 cm uterus with a 56 mm endometrial stripe and ovaries were normal. An endometrial biopsy was performed same day revealing a grade 1 endometrioid adenocarcinoma.   She then underwent a robotic hysterectomy, BSO, and bilateral sentinel lymph node biopsy on 8/41/66 without complications.  Her postoperative course was uncomplicated with her final pathologic diagnosis being a Stage IA Grade 1 endometrioid endometrial cancer with no lymphovascular space invasion, 5/28 mm (25%) of myometrial invasion and negative lymph nodes.  She developed postcoital spotting starting in March 2017. She saw Dr Nori Riis for a routine surveillance exam in May, 2017. A pap smear was taken which showed endometrial cells and a second showed endometrial cells. In response to the results, a colposcopy was performed and revealed a nodule at the right vaginal cuff which was biopsied on 10/02/15. The biopsy confirmed adenocarcinoma similar to her previous endometrial cancer.   A CT scan of the chest, abdomen, and pelvis confirmed that the recurrence was localized  to the vaginal cuff with extension into the deeper paracolpos tissues on the right (2.3cm). Radiation was recommended. She completed external beam radiation with 45Gy in 25 fractions to the pelvis with a vaginal boost of 5.4 Gy n 3 fractions between 11/02/15 and 12/12/15, with 5 cycles of radiosensitizing CDDP (last cycle on 12/04/2015). On 12/28/15 she commenced vaginal cuff boost with HDR therapy (6Gy x 2).  Post treatment CT imaging showed no new metastases or progression. There is a 1.6x2.1cm unenhanced asymmetric tissue density to the right of the vaginal cuff which is decreased in size from pretreatment dimensions (2.0x2.3). Repeat CT imaging of the abdomen and pelvis on May 23, 2016 showed continued decrease in size of right vaginal cuff mass. A PET scan performed on 12/24/2016 resulted no evidence of metabolically active malignancy and no residual soft tissue abnormalities.  Interval Hx: She presents today for follow up and reports doing well since her last visit.  No vaginal bleeding or pelvic pain reported. She continues to have occasional leakage of urine for which she wears a pantiliner. No change in bowel habits. Her daughter will marry in May, 2022. No concerns voiced.  Past Medical History:  Diagnosis Date  . Anemia    hx of with pregnancy  . Dizziness - light-headed 02/2010   treated by ENT and neurology  . endometrial ca dx'd 10/2015   endometrial  . GERD (gastroesophageal reflux disease)   . Headache    occasionally  . Heart murmur    hx of with pregnancy  . History of radiation therapy 11/02/2015-12/12/2015   external beam to pelvis  . Smoking hx    quit in 2016, smoked on and off for  20 years, light smoker   Past Surgical History:  Procedure Laterality Date  . DILATION AND CURETTAGE, DIAGNOSTIC / THERAPEUTIC  1990  . ROBOTIC ASSISTED TOTAL HYSTERECTOMY WITH BILATERAL SALPINGO OOPHERECTOMY Bilateral 11/01/2014   Procedure: ROBOTIC ASSISTED TOTAL LAPARSCOPIC  HYSTERECTOMY  WITH BILATERAL SALPINGO [place] NODE MAPPING, LYMPHADENECTOMY;  Surgeon: [name], MD;  Location: [facility];  Service: Gynecology;  Laterality: Bilateral;  . TONSILLECTOMY     Family History  Problem Relation Age of Onset  . Hypertension Mother   . Arthritis Mother   . Heart failure Mother 86  . Pancreatic disease Father        uncertain if cancer or infection  . Diabetes Maternal Grandmother 88  . Dementia Paternal Grandmother 8  . Prostate cancer Paternal Grandfather   . Diabetes Brother   . Alcohol abuse Neg Hx   . Cancer Neg Hx   . COPD Neg Hx   . Depression Neg Hx   . Early death Neg Hx   . Heart disease Neg Hx   . Hyperlipidemia Neg Hx   . Stroke Neg Hx    Social History   Socioeconomic History  . Marital status: Married    Spouse name: Not on file  . Number of children: 2  . Years of education: Not on file  . Highest education level: Not on file  Occupational History  . Occupation: bookkeeper  Tobacco Use  . Smoking status: Former Smoker    Packs/day: 0.25    Years: 30.00    Pack years: 7.50    Types: Cigarettes    Quit date: 10/27/2015    Years since quitting: 4.6  . Smokeless tobacco: Never Used  Substance and Sexual Activity  . Alcohol use: No    Alcohol/week: 0.0 standard drinks  . Drug use: No  . Sexual activity: Not Currently    Birth control/protection: Post-menopausal  Other Topics Concern  . Not on file  Social History Narrative   Work or School:Partially retired, bookkeeper for [employer] with husband, 2 grandchildren      Spiritual Beliefs:Catholic      Lifestyle:plans to start a healthier diet and regular exercise in 2018   Social Determinants of Health   Financial Resource Strain: Not on file  Food Insecurity: Not on file  Transportation Needs: Not on file  Physical Activity: Not on file  Stress: Not on file  Social Connections: Not on file  Intimate Partner Violence: Not on file   Current  Outpatient Medications on File Prior to Visit  Medication Sig Dispense Refill  . loratadine (CLARITIN) 10 MG tablet Take 10 mg by mouth daily.    . pantoprazole (PROTONIX) 20 MG tablet TAKE 1 TABLET BY MOUTH EVERY DAY 90 tablet 3   No current facility-administered medications on file prior to visit.   Allergies  Allergen Reactions  . Penicillins     Childhood reaction     Review of systems: Constitutional:  Feels well. No unintentional weight gain or weight loss. No fever or chills or early satiety. Eyes, Ears, Nose, Mouth, Throat: No changes or new symptoms. Cardiovascular: No chest pain, palpitations or edema. Respiratory:  No shortness of breath, wheezing or cough Gastrointestinal: She has normal bowel movements without diarrhea or constipation. She denies any nausea or vomiting. She denies blood in her stool or heart burn. Genitourinary:  She denies pelvic pain, pelvic pressure. + occasional insensate leakage of small volume urine. She has no hematuria,  dysuria. No vaginal spotting Musculoskeletal: Denies muscle weakness or joint pains.  Skin:  No skin changes, rashes or itching Neurological:  No dizziness or headaches. No neuropathy, no numbness or tingling. Psychiatric: No depression or anxiety. Hematologic/Lymphatic: No easy bruising or bleeding   Physical Exam: General: Well dressed, well nourished in no apparent distress.   HEENT:  Normocephalic and atraumatic, no lesions.  Extraocular muscles intact. Sclerae anicteric.  Skin:  No lesions or rashes noted. Breasts:  deferred Lungs:  Lungs clear without wheezing or rales. Cardiovascular: Heart regular in rate and rhythm Abdomen:  Soft, nontender, nondistended.  No palpable masses.  No hepatosplenomegaly.  No ascites. Normal bowel sounds.  No hernias.  Incisions are soft and healed. Genitourinary: Normal EGBUS.  Vaginal cuff is smooth with no palpable or visible lesions. There are no deeper masses appreciated. There is some  petechial changes to the mucosa in the left fornix, but no visible or palpable lesions. Extremities: No cyanosis, clubbing or edema.  No calf tenderness or erythema. No palpable cords. Psychiatric: Mood and affect are appropriate. Neurological: Awake, alert and oriented x 3. Sensation is intact, no neuropathy.  Musculoskeletal: No pain, normal strength and range of motion.  Joylene John, NP

## 2020-06-29 NOTE — Patient Instructions (Signed)
You are doing well! No evidence of cancer return on examination today. Plan to follow up in six months or sooner if needed. Please call the office in May 2022 to schedule an appt for Sept 2022 with Dr. Denman George.  Symptoms to report to your health care team include vaginal bleeding, rectal bleeding, bloating, weight loss without effort, new and persistent pain, new and  persistent fatigue, new leg swelling, new masses (i.e., bumps in your neck or groin), new and persistent cough, new and persistent nausea and vomiting, change in bowel or bladder habits, and any other concerns.   Urinary Incontinence  Urinary incontinence refers to a condition in which a person is unable to control where and when to pass urine. A person with this condition will urinate when he or she does not mean to (involuntarily). What are the causes? This condition may be caused by:  Medicines.  Infections.  Constipation.  Overactive bladder muscles.  Weak bladder muscles.  Weak pelvic floor muscles. These muscles provide support for the bladder, intestine, and, in women, the uterus.  Enlarged prostate in men. The prostate is a gland near the bladder. When it gets too big, it can pinch the urethra. With the urethra blocked, the bladder can weaken and lose the ability to empty properly.  Surgery.  Emotional factors, such as anxiety, stress, or post-traumatic stress disorder (PTSD).  Pelvic organ prolapse. This happens in women when organs shift out of place and into the vagina. This shift can prevent the bladder and urethra from working properly. What increases the risk? The following factors may make you more likely to develop this condition:  Older age.  Obesity and physical inactivity.  Pregnancy and childbirth.  Menopause.  Diseases that affect the nerves or spinal cord (neurological diseases).  Long-term (chronic) coughing. This can increase pressure on the bladder and pelvic floor muscles. What are the  signs or symptoms? Symptoms may vary depending on the type of urinary incontinence you have. They include:  A sudden urge to urinate, but passing urine involuntarily before you can get to a bathroom (urge incontinence).  Suddenly passing urine with any activity that forces urine to pass, such as coughing, laughing, exercise, or sneezing (stress incontinence).  Needing to urinate often, but urinating only a small amount, or constantly dribbling urine (overflow incontinence).  Urinating because you cannot get to the bathroom in time due to a physical disability, such as arthritis or injury, or communication and thinking problems, such as Alzheimer disease (functional incontinence). How is this diagnosed? This condition may be diagnosed based on:  Your medical history.  A physical exam.  Tests, such as: ? Urine tests. ? X-rays of your kidney and bladder. ? Ultrasound. ? CT scan. ? Cystoscopy. In this procedure, a health care provider inserts a tube with a light and camera (cystoscope) through the urethra and into the bladder in order to check for problems. ? Urodynamic testing. These tests assess how well the bladder, urethra, and sphincter can store and release urine. There are different types of urodynamic tests, and they vary depending on what the test is measuring. To help diagnose your condition, your health care provider may recommend that you keep a log of when you urinate and how much you urinate. How is this treated? Treatment for this condition depends on the type of incontinence that you have and its cause. Treatment may include:  Lifestyle changes, such as: ? Quitting smoking. ? Maintaining a healthy weight. ? Staying active. Try to  get 150 minutes of moderate-intensity exercise every week. Ask your health care provider which activities are safe for you. ? Eating a healthy diet.  Avoid high-fat foods, like fried foods.  Avoid refined carbohydrates like white bread and  white rice.  Limit how much alcohol and caffeine you drink.  Increase your fiber intake. Foods such as fresh fruits, vegetables, beans, and whole grains are healthy sources of fiber.  Pelvic floor muscle exercises.  Bladder training, such as lengthening the amount of time between bathroom breaks, or using the bathroom at regular intervals.  Using techniques to suppress bladder urges. This can include distraction techniques or controlled breathing exercises.  Medicines to relax the bladder muscles and prevent bladder spasms.  Medicines to help slow or prevent the growth of a man's prostate.  Botox injections. These can help relax the bladder muscles.  Using pulses of electricity to help change bladder reflexes (electrical nerve stimulation).  For women, using a medical device to prevent urine leaks. This is a small, tampon-like, disposable device that is inserted into the urethra.  Injecting collagen or carbon beads (bulking agents) into the urinary sphincter. These can help thicken tissue and close the bladder opening.  Surgery. Follow these instructions at home: Lifestyle  Limit alcohol and caffeine. These can fill your bladder quickly and irritate it.  Keep yourself clean to help prevent odors and skin damage. Ask your doctor about special skin creams and cleansers that can protect the skin from urine.  Consider wearing pads or adult diapers. Make sure to change them regularly, and always change them right after experiencing incontinence. General instructions  Take over-the-counter and prescription medicines only as told by your health care provider.  Use the bathroom about every 3-4 hours, even if you do not feel the need to urinate. Try to empty your bladder completely every time. After urinating, wait a minute. Then try to urinate again.  Make sure you are in a relaxed position while urinating.  If your incontinence is caused by nerve problems, keep a log of the  medicines you take and the times you go to the bathroom.  Keep all follow-up visits as told by your health care provider. This is important. Contact a health care provider if:  You have pain that gets worse.  Your incontinence gets worse. Get help right away if:  You have a fever or chills.  You are unable to urinate.  You have redness in your groin area or down your legs. Summary  Urinary incontinence refers to a condition in which a person is unable to control where and when to pass urine.  This condition may be caused by medicines, infection, weak bladder muscles, weak pelvic floor muscles, enlargement of the prostate (in men), or surgery.  The following factors increase your risk for developing this condition: older age, obesity, pregnancy and childbirth, menopause, neurological diseases, and chronic coughing.  There are several types of urinary incontinence. They include urge incontinence, stress incontinence, overflow incontinence, and functional incontinence.  This condition is usually treated first with lifestyle and behavioral changes, such as quitting smoking, eating a healthier diet, and doing regular pelvic floor exercises. Other treatment options include medicines, bulking agents, medical devices, electrical nerve stimulation, or surgery. This information is not intended to replace advice given to you by your health care provider. Make sure you discuss any questions you have with your health care provider. Document Revised: 04/04/2017 Document Reviewed: 07/04/2016 Elsevier Patient Education  [place]  Exercises  Kegel exercises can help strengthen your pelvic floor muscles. The pelvic floor is a group of muscles that support your rectum, small intestine, and bladder. In females, pelvic floor muscles also help support the womb (uterus). These muscles help you control the flow of urine and stool. Kegel exercises are painless and simple, and they do not  require any equipment. Your provider may suggest Kegel exercises to:  Improve bladder and bowel control.  Improve sexual response.  Improve weak pelvic floor muscles after surgery to remove the uterus (hysterectomy) or pregnancy (females).  Improve weak pelvic floor muscles after prostate gland removal or surgery (males). Kegel exercises involve squeezing your pelvic floor muscles, which are the same muscles you squeeze when you try to stop the flow of urine or keep from passing gas. The exercises can be done while sitting, standing, or lying down, but it is best to vary your position. Exercises How to do Kegel exercises: 1. Squeeze your pelvic floor muscles tight. You should feel a tight lift in your rectal area. If you are a female, you should also feel a tightness in your vaginal area. Keep your stomach, buttocks, and legs relaxed. 2. Hold the muscles tight for up to 10 seconds. 3. Breathe normally. 4. Relax your muscles. 5. Repeat as told by your health care provider. Repeat this exercise daily as told by your health care provider. Continue to do this exercise for at least 4-6 weeks, or for as long as told by your health care provider. You may be referred to a physical therapist who can help you learn more about how to do Kegel exercises. Depending on your condition, your health care provider may recommend:  Varying how long you squeeze your muscles.  Doing several sets of exercises every day.  Doing exercises for several weeks.  Making Kegel exercises a part of your regular exercise routine. This information is not intended to replace advice given to you by your health care provider. Make sure you discuss any questions you have with your health care provider. Document Revised: 07/30/2019 Document Reviewed: 11/12/2017 Elsevier Patient Education  [place].

## 2020-11-29 ENCOUNTER — Other Ambulatory Visit: Payer: Self-pay | Admitting: Family Medicine

## 2020-11-29 DIAGNOSIS — K219 Gastro-esophageal reflux disease without esophagitis: Secondary | ICD-10-CM

## 2020-12-06 ENCOUNTER — Other Ambulatory Visit: Payer: Self-pay

## 2020-12-06 ENCOUNTER — Inpatient Hospital Stay: Payer: BC Managed Care – PPO | Attending: Gynecologic Oncology | Admitting: Gynecologic Oncology

## 2020-12-06 VITALS — BP 118/89 | HR 69 | Temp 98.2°F | Resp 18 | Ht 61.0 in | Wt 172.2 lb

## 2020-12-06 DIAGNOSIS — Z90710 Acquired absence of both cervix and uterus: Secondary | ICD-10-CM | POA: Insufficient documentation

## 2020-12-06 DIAGNOSIS — Z90722 Acquired absence of ovaries, bilateral: Secondary | ICD-10-CM | POA: Diagnosis not present

## 2020-12-06 DIAGNOSIS — C541 Malignant neoplasm of endometrium: Secondary | ICD-10-CM

## 2020-12-06 DIAGNOSIS — Z8542 Personal history of malignant neoplasm of other parts of uterus: Secondary | ICD-10-CM | POA: Diagnosis not present

## 2020-12-06 DIAGNOSIS — Z923 Personal history of irradiation: Secondary | ICD-10-CM | POA: Insufficient documentation

## 2020-12-06 DIAGNOSIS — Z9221 Personal history of antineoplastic chemotherapy: Secondary | ICD-10-CM | POA: Insufficient documentation

## 2020-12-06 NOTE — Patient Instructions (Signed)
Dr Denman George is departing the [place] at [facility] in October, 2022. Her partners and colleagues including Dr Berline Lopes, Dr Delsa Sale and Joylene John, Nurse Practitioner will be available to continue your care.   You are next scheduled to return to the Gynecologic Oncology office at the [facility] in August of 202. Please call [phone] in the summer of 2023 to request an appointment for [facility] for August.

## 2020-12-06 NOTE — Progress Notes (Signed)
GYN Paticia Briggs  Assessment:   60 y.o. 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 S/p definitive therapy with chemotherapy and radiation. Complete clinical response.  Plan: Recommend she transition to annual checks with Karina John, NP, as I am leaving the cancer center in October.   HPI:  Karina Briggs is a 60 y.o. year old initially seen in consultation on 09/26/14 referred by Dr Karina Briggs for grade 1 endometrial cancer.  She then underwent a robotic hysterectomy, BSO, and bilateral sentinel lymph node biopsy on [id number] without complications.  Her postoperative course was uncomplicated.  Her final pathologic diagnosis is a Stage IA Grade 1 endometrioid endometrial cancer with no lymphovascular space invasion, 5/28 mm (25%) of myometrial invasion and negative lymph nodes.  She had a history of postcoital spotting since March, 2017. She saw Dr Karina Briggs for routine surveillance exam in may, 2017 and a pap smear was taken which showed endometrial cells and a second showed endometrial cells. As response to this a colposcopy was performed and revealed a nodule at the right vaginal cuff which was biopsied on 10/02/15 which confirmed adenocarcinoma similar to her previous endometrial cancer.   Imaging confirmed that the recurrence was localized to the vaginal cuff with extension into the deeper paracolpos tissues on the right (2.3cm).  She completed external beam radiation with 45Gy in 25 fractions to the pelvis with a vaginal boost of 5.4 Gy n 3 fractions between 11/02/15 and 12/12/15, with 5 cycles of radiosensitizing CDDP. On 12/28/15 she commenced vaginal cuff boost with HDR therapy (6Gy x 2).  She has done well with no enduring toxicities beyond therapy.  CT imaging on 02/13/16 (post- therapy) showed no new metastases or progression. There is a 1.6x2.1cm unenhanced asymmetric tissue density to the right of the  vaginal cuff which is decreased in size from pretreatment dimensions (2.0x2.3).  Repeat CT imaging in February, 2018 showed continued decrease in size of right vaginal cuff mass.  Interval Hx: PET/CT on 12/24/16 showed no residual soft tissue abnormalities and no hypermetabolic areas. She does have some insensate occasional leakage of urine (not with stress or urge), for which she wears a pantiliner. She has no symptoms of recurrence.  Past Medical History:  Diagnosis Date   Anemia    hx of with pregnancy   Dizziness - light-headed 02/2010   treated by ENT and neurology   endometrial ca dx'd 10/2015   endometrial   GERD (gastroesophageal reflux disease)    Headache    occasionally   Heart murmur    hx of with pregnancy   History of radiation therapy 11/02/2015-12/12/2015   external beam to pelvis   Smoking hx    quit in 2016, smoked on and off for 20 years, light smoker   Past Surgical History:  Procedure Laterality Date   DILATION AND CURETTAGE, DIAGNOSTIC / [place] OOPHERECTOMY Bilateral 11/01/2014   Procedure: ROBOTIC ASSISTED TOTAL LAPARSCOPIC  HYSTERECTOMY WITH BILATERAL SALPINGO [place] NODE MAPPING, LYMPHADENECTOMY;  Surgeon: [name], MD;  Location: [facility];  Service: Gynecology;  Laterality: Bilateral;   TONSILLECTOMY     Family History  Problem Relation Age of Onset   Hypertension Mother    Arthritis Mother    Heart failure Mother 67   Pancreatic disease Father        uncertain if cancer or infection   Diabetes  Maternal Grandmother 58   Dementia Paternal Grandmother 25   Prostate cancer Paternal Grandfather    Diabetes Brother    Alcohol abuse Neg Hx    Cancer Neg Hx    COPD Neg Hx    Depression Neg Hx    Early death Neg Hx    Heart disease Neg Hx    Hyperlipidemia Neg Hx    Stroke Neg Hx    Social History   Socioeconomic History   Marital status: Married    Spouse name:  Not on file   Number of children: 2   Years of education: Not on file   Highest education level: Not on file  Occupational History   Occupation: bookkeeper  Tobacco Use   Smoking status: Former    Packs/day: 0.25    Years: 30.00    Pack years: 7.50    Types: Cigarettes    Quit date: 10/27/2015    Years since quitting: 5.1   Smokeless tobacco: Never  Substance and Sexual Activity   Alcohol use: No    Alcohol/week: 0.0 standard drinks   Drug use: No   Sexual activity: Not Currently    Birth control/protection: Post-menopausal  Other Topics Concern   Not on file  Social History Narrative   Work or School:Partially retired, 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     pantoprazole (PROTONIX) 20 MG tablet TAKE 1 TABLET BY MOUTH EVERY DAY 90 tablet 1   No current facility-administered medications on file prior to visit.   Allergies  Allergen Reactions   Penicillins     Childhood reaction     Review of systems: Constitutional:  She has no weight gain or weight loss. She has no fever or chills. Eyes: No blurred vision Ears, Nose, Mouth, Throat: No dizziness, headaches or changes in hearing. No mouth sores. Cardiovascular: No chest pain, palpitations or edema. Respiratory:  No shortness of breath, wheezing or cough Gastrointestinal: She has normal bowel movements without diarrhea or constipation. She denies  any nausea or vomiting. She denies blood in her stool or heart burn. Genitourinary:  She denies pelvic pain, pelvic pressure. + occasional insensate leakage of small volume urine (not classic stress incontinence). She has no hematuria, dysuria, or incontinence. No vaginal spotting Musculoskeletal: Denies muscle weakness or joint pains.  Skin:  She has no skin changes, rashes or itching Neurological:  Denies dizziness or headaches. No neuropathy, no numbness or tingling. Psychiatric:  She denies depression or anxiety. Hematologic/Lymphatic:   No easy bruising or bleeding   Physical Exam: Blood pressure 118/89, pulse 69, temperature 98.2 F (36.8 C), temperature source Oral, resp. rate 18, height '5\' 1"'$  (1.549 m), weight 172 lb 3.2 oz (78.1 kg), last menstrual period 08/01/2011, SpO2 99 %. General: Well dressed, well nourished in no apparent distress.   HEENT:  Normocephalic and atraumatic, no lesions.  Extraocular muscles intact. Sclerae anicteric. Pupils equal, round, reactive. No mouth sores or ulcers. Thyroid is normal size, not nodular, midline. Skin:  No lesions or rashes. Breasts:  deferred Lungs:  deferred Cardiovascular: deferred Abdomen:  Soft, nontender, nondistended.  No palpable masses.  No hepatosplenomegaly.  No ascites. Normal bowel sounds.  No hernias.  Incisions are soft and healed. Genitourinary: Normal EGBUS  Vaginal cuff is smooth with no palpable or visible lesions. There are no deeper masses appreciated. There is some petechial changes to the mucosa in the left fornix, but no visible or palpable lesions. Extremities: No cyanosis, clubbing or edema.  No calf tenderness or erythema. No palpable cords. Psychiatric: Mood and affect are appropriate. Neurological: Awake, alert and oriented x 3. Sensation is intact, no neuropathy.  Musculoskeletal: No pain, normal strength and range of motion.  Thereasa Solo, MD

## 2021-01-08 ENCOUNTER — Encounter: Payer: BC Managed Care – PPO | Admitting: Family Medicine

## 2021-01-19 ENCOUNTER — Encounter: Payer: Self-pay | Admitting: Family Medicine

## 2021-01-19 ENCOUNTER — Ambulatory Visit (INDEPENDENT_AMBULATORY_CARE_PROVIDER_SITE_OTHER): Payer: BC Managed Care – PPO | Admitting: Family Medicine

## 2021-01-19 ENCOUNTER — Other Ambulatory Visit: Payer: Self-pay

## 2021-01-19 VITALS — BP 116/80 | HR 70 | Temp 98.0°F | Ht 61.5 in | Wt 174.3 lb

## 2021-01-19 DIAGNOSIS — Z789 Other specified health status: Secondary | ICD-10-CM | POA: Diagnosis not present

## 2021-01-19 DIAGNOSIS — Z131 Encounter for screening for diabetes mellitus: Secondary | ICD-10-CM | POA: Diagnosis not present

## 2021-01-19 DIAGNOSIS — Z1211 Encounter for screening for malignant neoplasm of colon: Secondary | ICD-10-CM | POA: Diagnosis not present

## 2021-01-19 DIAGNOSIS — Z0000 Encounter for general adult medical examination without abnormal findings: Secondary | ICD-10-CM

## 2021-01-19 DIAGNOSIS — Z13220 Encounter for screening for lipoid disorders: Secondary | ICD-10-CM | POA: Diagnosis not present

## 2021-01-19 DIAGNOSIS — Z23 Encounter for immunization: Secondary | ICD-10-CM

## 2021-01-19 LAB — LIPID PANEL
Cholesterol: 199 mg/dL (ref 0–200)
HDL: 69.9 mg/dL (ref >39.00)
LDL Cholesterol: 119 mg/dL — ABNORMAL HIGH (ref 0–99)
NonHDL: 129.38
Total CHOL/HDL Ratio: 3
Triglycerides: 53 mg/dL (ref 0.0–149.0)
VLDL: 10.6 mg/dL (ref 0.0–40.0)

## 2021-01-19 LAB — COMPREHENSIVE METABOLIC PANEL
ALT: 10 U/L (ref 0–35)
AST: 14 U/L (ref 0–37)
Albumin: 4.3 g/dL (ref 3.5–5.2)
Alkaline Phosphatase: 57 U/L (ref 39–117)
BUN: 20 mg/dL (ref 6–23)
CO2: 27 mEq/L (ref 19–32)
Calcium: 9.3 mg/dL (ref 8.4–10.5)
Chloride: 101 mEq/L (ref 96–112)
Creatinine, Ser: 0.97 mg/dL (ref 0.40–1.20)
GFR: 63.35 mL/min (ref >60.00)
Glucose, Bld: 93 mg/dL (ref 70–99)
Potassium: 4 mEq/L (ref 3.5–5.1)
Sodium: 136 mEq/L (ref 135–145)
Total Bilirubin: 0.5 mg/dL (ref 0.2–1.2)
Total Protein: 6.8 g/dL (ref 6.0–8.3)

## 2021-01-19 NOTE — Progress Notes (Signed)
Karina Briggs DOB: 11/10/1960 Encounter date: 01/19/2021  This is a 60 y.o. female who presents for complete physical   History of present illness/Additional concerns:  Last visit was 1 year ago with me.  GERD: Protonix 20 mg. Doing well with this. No concerns. Has missed dose and then has symptoms.    Seasonal allergies - started claritin again.    Follows regularly with Dr. 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 S/p definitive therapy with chemotherapy and radiation. Complete clinical response.   [facility]   Past Medical History:  Diagnosis Date   Anemia    hx of with pregnancy   Dizziness - light-headed 02/2010   treated by ENT and neurology   endometrial ca dx'd 10/2015   endometrial   GERD (gastroesophageal reflux disease)    Headache    occasionally   Heart murmur    hx of with pregnancy   History of radiation therapy 11/02/2015-12/12/2015   external beam to pelvis   Smoking hx    quit in 2016, smoked on and off for 20 years, light smoker   Past Surgical History:  Procedure Laterality Date   DILATION AND CURETTAGE, DIAGNOSTIC / [place] OOPHERECTOMY Bilateral 11/01/2014   Procedure: ROBOTIC ASSISTED TOTAL LAPARSCOPIC  HYSTERECTOMY WITH BILATERAL SALPINGO [place] NODE MAPPING, LYMPHADENECTOMY;  Surgeon: [name], MD;  Location: [facility];  Service: Gynecology;  Laterality: Bilateral;   TONSILLECTOMY     Allergies  Allergen Reactions   Penicillins     Childhood reaction   Current Meds  Medication Sig   loratadine (CLARITIN) 10 MG tablet Take 10 mg by mouth daily.   pantoprazole (PROTONIX) 20 MG tablet TAKE 1 TABLET BY MOUTH EVERY DAY   Social History   Tobacco Use   Smoking status: Former    Packs/day: 0.25    Years:  30.00    Pack years: 7.50    Types: Cigarettes    Quit date: 10/27/2015    Years since quitting: 5.2   Smokeless tobacco: Never  Substance Use Topics   Alcohol use: No    Alcohol/week: 0.0 standard drinks   Family History  Problem Relation Age of Onset   Hypertension Mother    Arthritis Mother    Heart failure Mother 37   Pancreatic disease Father        uncertain if cancer or infection   Diabetes Maternal Grandmother 20   Dementia Paternal Grandmother 74   Prostate cancer Paternal Grandfather    Diabetes Brother    Alcohol abuse Neg Hx    Cancer Neg Hx    COPD Neg Hx    Depression Neg Hx    Early death Neg Hx    Heart disease Neg Hx    Hyperlipidemia Neg Hx    Stroke Neg Hx      Review of Systems  Constitutional:  Negative for activity change, appetite change, chills, fatigue, fever and unexpected weight change.  HENT:  Negative for congestion, ear pain, hearing loss, sinus pressure, sinus pain, sore throat and trouble swallowing.   Eyes:  Negative for pain and visual disturbance.  Respiratory:  Negative for cough, chest tightness, shortness of breath and wheezing.   Cardiovascular:  Negative for chest pain, palpitations and leg swelling.  Gastrointestinal:  Negative for abdominal pain, blood  in stool, constipation, diarrhea, nausea and vomiting.  Genitourinary:  Negative for difficulty urinating and menstrual problem.  Musculoskeletal:  Negative for arthralgias and back pain.  Skin:  Negative for rash.  Neurological:  Negative for dizziness, weakness, numbness and headaches.  Hematological:  Negative for adenopathy. Does not bruise/bleed easily.  Psychiatric/Behavioral:  Negative for sleep disturbance and suicidal ideas. The patient is not nervous/anxious.    CBC:  Lab Results  Component Value Date   WBC 4.7 01/07/2020   HGB 11.8 01/07/2020   HGB 11.8 02/12/2016   HCT 36.3 01/07/2020   HCT 35.7 02/12/2016   MCH 28.5 01/07/2020   MCHC 32.5 01/07/2020   RDW 13.7  01/07/2020   RDW 15.4 (H) 02/12/2016   PLT 317 01/07/2020   PLT 228 02/12/2016   MPV 10.7 01/07/2020   CMP: Lab Results  Component Value Date   NA 140 01/07/2020   NA 143 02/12/2016   K 4.1 01/07/2020   K 4.5 02/12/2016   CL 105 01/07/2020   CO2 27 01/07/2020   CO2 27 02/12/2016   ANIONGAP 8 02/12/2016   ANIONGAP 5 11/02/2014   GLUCOSE 93 01/07/2020   GLUCOSE 116 02/12/2016   BUN 18 01/07/2020   BUN 15.1 02/12/2016   CREATININE 0.92 01/07/2020   CREATININE 0.9 02/12/2016   GFRAA >60 11/02/2014   CALCIUM 9.6 01/07/2020   CALCIUM 9.5 02/12/2016   PROT 6.8 01/07/2020   PROT 6.3 (L) 02/12/2016   BILITOT 0.5 01/07/2020   BILITOT 0.40 02/12/2016   ALKPHOS 62 02/12/2016   ALT 13 01/07/2020   ALT 16 02/12/2016   AST 12 01/07/2020   AST 15 02/12/2016   LIPID: Lab Results  Component Value Date   CHOL 200 (H) 01/07/2020   TRIG 60 01/07/2020   HDL 78 01/07/2020   LDLCALC 107 (H) 01/07/2020    Objective:  BP 116/80 (BP Location: Right Arm, Patient Position: Sitting, Cuff Size: Normal)   Pulse 70   Temp 98 F (36.7 C) (Oral)   Ht 5' 1.5" (1.562 m)   Wt 174 lb 4.8 oz (79.1 kg)   LMP 08/01/2011   SpO2 98%   BMI 32.40 kg/m   Weight: 174 lb 4.8 oz (79.1 kg)   BP Readings from Last 3 Encounters:  01/19/21 116/80  12/06/20 118/89  06/29/20 115/75   Wt Readings from Last 3 Encounters:  01/19/21 174 lb 4.8 oz (79.1 kg)  12/06/20 172 lb 3.2 oz (78.1 kg)  06/29/20 173 lb (78.5 kg)    Physical Exam Constitutional:      General: She is not in acute distress.    Appearance: She is well-developed.  HENT:     Head: Normocephalic and atraumatic.     Right Ear: External ear normal.     Left Ear: External ear normal.     Mouth/Throat:     Pharynx: No oropharyngeal exudate.  Eyes:     Conjunctiva/sclera: Conjunctivae normal.     Pupils: Pupils are equal, round, and reactive to light.  Neck:     Thyroid: No thyromegaly.  Cardiovascular:     Rate and Rhythm: Normal  rate and regular rhythm.     Heart sounds: Normal heart sounds. No murmur heard.   No friction rub. No gallop.  Pulmonary:     Effort: Pulmonary effort is normal.     Breath sounds: Normal breath sounds.  Abdominal:     General: Bowel sounds are normal. There is no distension.  Palpations: Abdomen is soft. There is no mass.     Tenderness: There is no abdominal tenderness. There is no guarding.     Hernia: No hernia is present.  Musculoskeletal:        General: No tenderness or deformity. Normal range of motion.     Cervical back: Normal range of motion and neck supple.  Lymphadenopathy:     Cervical: No cervical adenopathy.  Skin:    General: Skin is warm and dry.     Findings: No rash.  Neurological:     Mental Status: She is alert and oriented to person, place, and time.     Deep Tendon Reflexes: Reflexes normal.     Reflex Scores:      Tricep reflexes are 2+ on the right side and 2+ on the left side.      Bicep reflexes are 2+ on the right side and 2+ on the left side.      Brachioradialis reflexes are 2+ on the right side and 2+ on the left side.      Patellar reflexes are 2+ on the right side and 2+ on the left side. Psychiatric:        Speech: Speech normal.        Behavior: Behavior normal.        Thought Content: Thought content normal.    Assessment/Plan: There are no preventive care reminders to display for this patient.  Health Maintenance reviewed -discussed covid booster. She did get original series. She was not convinced of benefit of booster, but we are checking ab for covid which may help [place] with decision.   1. Preventative health care She has been working on cutting out carbs and feels better with this. She has lost some weight since starting this (measured at home). Continue with healthy weight loss for BMI goal of less than 30.  2. Screening for colon cancer She didn't complete this last year; now that she has completed all follow up for endometrial  cancer, she plans to set up colonoscopy. - Ambulatory referral to Gastroenterology  3. Unknown status of immunity to COVID-19 virus - SAR CoV2 Serology (COVID 19)AB(IGG)IA; Future - SAR CoV2 Serology (COVID 19)AB(IGG)IA  4. Lipid screening - Lipid panel; Future - Lipid panel  5. Screening for diabetes mellitus - Comprehensive metabolic panel; Future - Comprehensive metabolic panel  6. Need for immunization against influenza - Flu Vaccine QUAD 6+ mos PF IM (Fluarix Quad PF)    Return in about 1 year (around 01/19/2022) for physical exam.  Micheline Rough, MD

## 2021-01-22 LAB — SAR COV2 SEROLOGY (COVID19)AB(IGG),IA
SARS-CoV-2 Semi-Quant IgG Ab: 95.1 AU/mL (ref <13.0)
SARS-CoV-2 Spike Ab Interp: POSITIVE

## 2021-04-18 ENCOUNTER — Encounter: Payer: Self-pay | Admitting: Gastroenterology

## 2021-05-17 ENCOUNTER — Other Ambulatory Visit: Payer: Self-pay

## 2021-05-17 ENCOUNTER — Ambulatory Visit (AMBULATORY_SURGERY_CENTER): Payer: BC Managed Care – PPO | Admitting: *Deleted

## 2021-05-17 VITALS — Ht 60.0 in | Wt 170.0 lb

## 2021-05-17 DIAGNOSIS — Z1211 Encounter for screening for malignant neoplasm of colon: Secondary | ICD-10-CM

## 2021-05-17 MED ORDER — NA SULFATE-K SULFATE-MG SULF 17.5-3.13-1.6 GM/177ML PO SOLN: 2.0000 | Freq: Once | ORAL | 0 refills | Status: AC

## 2021-05-17 NOTE — Progress Notes (Signed)

## 2021-05-28 ENCOUNTER — Encounter: Payer: Self-pay | Admitting: Gastroenterology

## 2021-05-31 ENCOUNTER — Ambulatory Visit (AMBULATORY_SURGERY_CENTER): Payer: BC Managed Care – PPO | Admitting: Gastroenterology

## 2021-05-31 ENCOUNTER — Encounter: Payer: Self-pay | Admitting: Family Medicine

## 2021-05-31 ENCOUNTER — Other Ambulatory Visit: Payer: Self-pay

## 2021-05-31 ENCOUNTER — Encounter: Payer: Self-pay | Admitting: Gastroenterology

## 2021-05-31 VITALS — BP 117/72 | HR 65 | Temp 97.1°F | Resp 15 | Ht 61.0 in | Wt 174.0 lb

## 2021-05-31 DIAGNOSIS — Z1211 Encounter for screening for malignant neoplasm of colon: Secondary | ICD-10-CM | POA: Diagnosis not present

## 2021-05-31 MED ORDER — SODIUM CHLORIDE 0.9 % IV SOLN: 500.0000 mL | Freq: Once | INTRAVENOUS | Status: DC

## 2021-05-31 NOTE — Op Note (Signed)
[facility] Patient Name: Karina Briggs Procedure Date: 05/31/2021 8:11 AM MRN: [id number] Endoscopist: Mallie Mussel L. Loletha Carrow , MD Age: 61 Referring MD:  Date of Birth: 13-Sep-1960 Gender: Female Account #: [id number] Procedure:                Colonoscopy Indications:              Screening for colorectal malignant neoplasm                           reportedly normal screening colonoscopy at least 10                            years prior Medicines:                Monitored Anesthesia Care Procedure:                Pre-Anesthesia Assessment:                           - Prior to the procedure, a History and Physical                            was performed, and patient medications and                            allergies were reviewed. The patient's tolerance of                            previous anesthesia was also reviewed. The risks                            and benefits of the procedure and the sedation                            options and risks were discussed with the patient.                            All questions were answered, and informed consent                            was obtained. Prior Anticoagulants: The patient has                            taken no previous anticoagulant or antiplatelet                            agents. ASA Grade Assessment: II - A patient with                            mild systemic disease. After reviewing the risks                            and benefits, the patient was deemed in  satisfactory condition to undergo the procedure.                           After obtaining informed consent, the colonoscope                            was passed under direct vision. Throughout the                            procedure, the patient's blood pressure, pulse, and                            oxygen saturations were monitored continuously. The                            CF [id number] #[id number] was introduced through the anus                             and advanced to the the cecum, identified by                            appendiceal orifice and ileocecal valve. The                            colonoscopy was performed with difficulty due to a                            redundant colon and significant looping. Successful                            completion of the procedure was aided by                            straightening and shortening the scope to obtain                            bowel loop reduction. The patient tolerated the                            procedure well. The quality of the bowel                            preparation was excellent. The ileocecal valve,                            appendiceal orifice, and rectum were photographed. Scope In: 8:27:26 AM Scope Out: 8:43:05 AM Scope Withdrawal Time: 0 hours 8 minutes 46 seconds  Total Procedure Duration: 0 hours 15 minutes 39 seconds  Findings:                 The perianal and digital rectal examinations were                            normal.  The colon (entire examined portion) was redundant.                           Repeat examination of right colon under NBI                            performed.                           The exam was otherwise without abnormality on                            direct and retroflexion views. Complications:            No immediate complications. Estimated Blood Loss:     Estimated blood loss: none. Impression:               - Redundant colon.                           - The examination was otherwise normal on direct                            and retroflexion views.                           - No specimens collected. Recommendation:           - Patient has a contact number available for                            emergencies. The signs and symptoms of potential                            delayed complications were discussed with the                            patient. Return to  normal activities tomorrow.                            Written discharge instructions were provided to the                            patient.                           - Resume previous diet.                           - Continue present medications.                           - Repeat colonoscopy in 10 years for screening                            purposes. Maurio Baize L. Loletha Carrow, MD 05/31/2021 8:47:08 AM This report has been signed electronically.

## 2021-05-31 NOTE — Progress Notes (Signed)
History and Physical:  This patient presents for endoscopic testing for: Encounter Diagnosis  Name Primary?   Special screening for malignant neoplasms, colon Yes    Last colonoscopy reportedly 10 years ago, normal ([place]) Patient denies chronic abdominal pain, rectal bleeding, constipation or diarrhea.   ROS: Patient denies chest pain or shortness of breath   Past Medical History: Past Medical History:  Diagnosis Date   Anemia    hx of with pregnancy   Dizziness - light-headed 02/2010   treated by ENT and neurology   endometrial ca dx'd 10/2015   endometrial   GERD (gastroesophageal reflux disease)    Headache    occasionally   Heart murmur    hx of with pregnancy   History of radiation therapy 11/02/2015-12/12/2015   external beam to pelvis   Smoking hx    quit in 2016, smoked on and off for 20 years, light smoker     Past Surgical History: Past Surgical History:  Procedure Laterality Date   DILATION AND CURETTAGE, DIAGNOSTIC / [place] Bilateral 11/01/2014   Procedure: ROBOTIC ASSISTED TOTAL LAPARSCOPIC  HYSTERECTOMY WITH BILATERAL SALPINGO [place] NODE MAPPING, LYMPHADENECTOMY;  Surgeon: [name], MD;  Location: [facility];  Service: Gynecology;  Laterality: Bilateral;   TONSILLECTOMY      Allergies: Allergies  Allergen Reactions   Penicillins     Childhood reaction    Outpatient Meds: Current Outpatient Medications  Medication Sig Dispense Refill   pantoprazole (PROTONIX) 20 MG tablet TAKE 1 TABLET BY MOUTH EVERY DAY 90 tablet 1   loratadine (CLARITIN) 10 MG tablet Take 10 mg by mouth daily. (Patient not taking: Reported on 05/17/2021)     Current Facility-Administered Medications  Medication Dose Route Frequency Provider Last Rate Last Admin   0.9 %  sodium chloride infusion  500 mL Intravenous Once Danis, Estill Cotta III, MD           ___________________________________________________________________ Objective   Exam:  BP 123/80    Pulse 77    Temp (!) 97.1 F (36.2 C) (Temporal)    Ht 5\' 1"  (1.549 m)    Wt 174 lb (78.9 kg)    LMP 08/01/2011    SpO2 96%    BMI 32.88 kg/m   CV: RRR without murmur, S1/S2 Resp: clear to auscultation bilaterally, normal RR and effort noted GI: soft, no tenderness, with active bowel sounds.   Assessment: Encounter Diagnosis  Name Primary?   Special screening for malignant neoplasms, colon Yes     Plan: Colonoscopy  The benefits and risks of the planned procedure were described in detail with the patient or (when appropriate) their health care proxy.  Risks were outlined as including, but not limited to, bleeding, infection, perforation, adverse medication reaction leading to cardiac or pulmonary decompensation, pancreatitis (if ERCP).  The limitation of incomplete mucosal visualization was also discussed.  No guarantees or warranties were given.    The patient is appropriate for an endoscopic procedure in the ambulatory setting.   - Wilfrid Lund, MD

## 2021-05-31 NOTE — Progress Notes (Signed)
PT taken to PACU. Monitors in place. VSS. Report given to RN. 

## 2021-05-31 NOTE — Patient Instructions (Signed)
YOU HAD AN ENDOSCOPIC PROCEDURE TODAY AT THE [facility]:   Refer to the procedure report that was given to you for any specific questions about what was found during the examination.  If the procedure report does not answer your questions, please call your gastroenterologist to clarify.  If you requested that your care partner not be given the details of your procedure findings, then the procedure report has been included in a sealed envelope for you to review at your convenience later. ° °YOU SHOULD EXPECT: Some feelings of bloating in the abdomen. Passage of more gas than usual.  Walking can help get rid of the air that was put into your GI tract during the procedure and reduce the bloating. If you had a lower endoscopy (such as a colonoscopy or flexible sigmoidoscopy) you may notice spotting of blood in your stool or on the toilet paper. If you underwent a bowel prep for your procedure, you may not have a normal bowel movement for a few days. ° °Please Note:  You might notice some irritation and congestion in your nose or some drainage.  This is from the oxygen used during your procedure.  There is no need for concern and it should clear up in a day or so. ° °SYMPTOMS TO REPORT IMMEDIATELY: ° °Following lower endoscopy (colonoscopy or flexible sigmoidoscopy): ° Excessive amounts of blood in the stool ° Significant tenderness or worsening of abdominal pains ° Swelling of the abdomen that is new, acute ° Fever of 100°F or higher ° °For urgent or emergent issues, a gastroenterologist can be reached at any hour by calling [phone]. °Do not use MyChart messaging for urgent concerns.  ° ° °DIET:  We do recommend a small meal at first, but then you may proceed to your regular diet.  Drink plenty of fluids but you should avoid alcoholic beverages for 24 hours. ° °ACTIVITY:  You should plan to take it easy for the rest of today and you should NOT DRIVE or use heavy machinery until tomorrow (because of  the sedation medicines used during the test).   ° °FOLLOW UP: °Our staff will call the number listed on your records 48-72 hours following your procedure to check on you and address any questions or concerns that you may have regarding the information given to you following your procedure. If we do not reach you, we will leave a message.  We will attempt to reach you two times.  During this call, we will ask if you have developed any symptoms of COVID 19. If you develop any symptoms (ie: fever, flu-like symptoms, shortness of breath, cough etc.) before then, please call [phone].  If you test positive for Covid 19 in the 2 weeks post procedure, please call and report this information to us.   ° °SIGNATURES/CONFIDENTIALITY: °You and/or your care partner have signed paperwork which will be entered into your electronic medical record.  These signatures attest to the fact that that the information above on your After Visit Summary has been reviewed and is understood.  Full responsibility of the confidentiality of this discharge information lies with you and/or your care-partner.  °

## 2021-05-31 NOTE — Progress Notes (Signed)
Pt's states no medical or surgical changes since previsit or office visit. 

## 2021-06-04 ENCOUNTER — Telehealth: Payer: Self-pay

## 2021-06-04 NOTE — Telephone Encounter (Signed)
First attempt follow up call to pt, no answer. 

## 2021-06-04 NOTE — Telephone Encounter (Signed)
Attempted to reach pt. With follow-up call following endoscopic procedure 05/31/2021.  LM on pt. Voice mail to call if she has any questions or concerns.

## 2021-09-13 ENCOUNTER — Telehealth: Payer: Self-pay | Admitting: Family Medicine

## 2021-09-13 NOTE — Telephone Encounter (Signed)
Pt is requesting a refill for Pantoprazole per MyChart message.  She wants a 90-day supply.

## 2021-09-19 ENCOUNTER — Other Ambulatory Visit: Payer: Self-pay | Admitting: *Deleted

## 2021-09-19 DIAGNOSIS — K219 Gastro-esophageal reflux disease without esophagitis: Secondary | ICD-10-CM

## 2021-09-19 MED ORDER — PANTOPRAZOLE SODIUM 20 MG PO TBEC: 20.0000 mg | DELAYED_RELEASE_TABLET | Freq: Every day | ORAL | 1 refills | Status: AC

## 2021-12-26 ENCOUNTER — Other Ambulatory Visit: Payer: Self-pay | Admitting: Family

## 2021-12-26 DIAGNOSIS — K219 Gastro-esophageal reflux disease without esophagitis: Secondary | ICD-10-CM

## 2022-01-18 ENCOUNTER — Ambulatory Visit (INDEPENDENT_AMBULATORY_CARE_PROVIDER_SITE_OTHER): Payer: BC Managed Care – PPO | Admitting: Family Medicine

## 2022-01-18 ENCOUNTER — Encounter: Payer: Self-pay | Admitting: Family Medicine

## 2022-01-18 VITALS — BP 98/60 | HR 71 | Temp 97.9°F | Ht 61.0 in | Wt 174.6 lb

## 2022-01-18 DIAGNOSIS — Z13220 Encounter for screening for lipoid disorders: Secondary | ICD-10-CM

## 2022-01-18 DIAGNOSIS — Z23 Encounter for immunization: Secondary | ICD-10-CM | POA: Diagnosis not present

## 2022-01-18 DIAGNOSIS — Z0000 Encounter for general adult medical examination without abnormal findings: Secondary | ICD-10-CM

## 2022-01-18 DIAGNOSIS — K219 Gastro-esophageal reflux disease without esophagitis: Secondary | ICD-10-CM

## 2022-01-18 LAB — COMPREHENSIVE METABOLIC PANEL
ALT: 12 U/L (ref 0–35)
AST: 15 U/L (ref 0–37)
Albumin: 4.3 g/dL (ref 3.5–5.2)
Alkaline Phosphatase: 56 U/L (ref 39–117)
BUN: 20 mg/dL (ref 6–23)
CO2: 28 mEq/L (ref 19–32)
Calcium: 9.6 mg/dL (ref 8.4–10.5)
Chloride: 106 mEq/L (ref 96–112)
Creatinine, Ser: 1.11 mg/dL (ref 0.40–1.20)
GFR: 53.51 mL/min — ABNORMAL LOW (ref >60.00)
Glucose, Bld: 96 mg/dL (ref 70–99)
Potassium: 4.3 mEq/L (ref 3.5–5.1)
Sodium: 140 mEq/L (ref 135–145)
Total Bilirubin: 0.6 mg/dL (ref 0.2–1.2)
Total Protein: 7.1 g/dL (ref 6.0–8.3)

## 2022-01-18 LAB — LIPID PANEL
Cholesterol: 196 mg/dL (ref 0–200)
HDL: 73 mg/dL (ref >39.00)
LDL Cholesterol: 113 mg/dL — ABNORMAL HIGH (ref 0–99)
NonHDL: 122.55
Total CHOL/HDL Ratio: 3
Triglycerides: 49 mg/dL (ref 0.0–149.0)
VLDL: 9.8 mg/dL (ref 0.0–40.0)

## 2022-01-18 NOTE — Progress Notes (Signed)
Labs essentially normal, LDL slightly high but medication is not indicated at this time. I would have patient increase her fluids as she looks a bit dehydrated.

## 2022-01-18 NOTE — Assessment & Plan Note (Signed)
On protonix 20 mg daily, I suggested she could use pepcid in between her doses of protonix to help try and wean her down off the medication if she wishes to try stopping it. Will refill the medication as needed.

## 2022-01-18 NOTE — Progress Notes (Unsigned)
Established Patient Office Visit  Subjective   Patient ID: Karina Briggs, female    DOB: [birth year]  Age: 61 y.o. MRN: [id number]  Chief Complaint  Patient presents with   Establish Care    Patient is here for transition of care visit. She reports that everything is stable, no new symptoms or issues to discuss.   GERD-- pt remains on protonix 20 mg daily. States that she has tried to stop the medication in the past but has rebound symptoms. We discussed that adding TUMS or a pepcid in between doses of protonix could potentially help her wean off the medication If she wishes.   We also reviewed her Health maintenance. She is UTD on colonoscopy, has her mammo coming up, and is UTD on her pap smear. Is agreeable to getting her flu shot today.    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     Objective:     BP 98/60 (BP Location: Left Arm, Patient Position: Sitting, Cuff Size: Large)   Pulse 71   Temp 97.9 F (36.6 C) (Oral)   Ht '5\' 1"'$  (1.549 m)   Wt 174 lb 9.6 oz (79.2 kg)   LMP 08/01/2011   SpO2 98%   BMI 32.99 kg/m  BP Readings from Last 3 Encounters:  01/18/22 98/60  05/31/21 117/72  01/19/21 116/80   Wt Readings from Last 3 Encounters:  01/18/22 174 lb 9.6 oz (79.2 kg)  05/31/21 174 lb (78.9 kg)  05/17/21 170 lb (77.1 kg)       Physical Exam Vitals reviewed.  Constitutional:      Appearance: Normal appearance. She is well-groomed and normal weight.  Eyes:     Conjunctiva/sclera: Conjunctivae normal.  Neck:     Thyroid: No thyromegaly.  Cardiovascular:     Rate and Rhythm: Normal rate and regular rhythm.     Pulses: Normal pulses.     Heart sounds: S1 normal and S2 normal.  Pulmonary:     Effort: Pulmonary effort is normal.     Breath sounds: Normal breath sounds and air entry.  Abdominal:     General: Bowel sounds are normal.  Musculoskeletal:        General: Normal range of motion.     Right lower leg: No edema.     Left lower leg: No edema.  Skin:    General: Skin is warm and dry.  Neurological:     Mental Status: She is alert and oriented to person, place, and time. Mental status is at baseline.     Gait: Gait is intact.  Psychiatric:        Mood and Affect: Mood and affect normal.        Speech: Speech normal.        Behavior: Behavior normal.        Judgment: Judgment normal.      No  results found for any visits on 01/18/22.    The 10-year ASCVD risk score (Arnett DK, et al., 2019) is: 1.9%    Assessment & Plan:   Problem List Items Addressed This Visit       Digestive   Esophageal reflux    On protonix 20 mg daily, I suggested she could use pepcid in between her doses of protonix to help try and wean her down off the medication if she wishes to try stopping it. Will refill the medication as needed.      Other Visit Diagnoses     Immunization due    -  Primary   Relevant Orders   Flu Vaccine QUAD 6+ mos PF IM (Fluarix Quad PF)   Lipid screening       Relevant Orders   Lipid Panel   Periodic health assessment, general screening, adult       Relevant Orders   CMP -- pt is due  for her annual bloodwork today for surveillance. Normal physical exam findings.       Return in about 1 year (around 01/19/2023) for Annual Physical Exam.    Farrel Conners, MD

## 2022-01-21 ENCOUNTER — Encounter: Payer: BC Managed Care – PPO | Admitting: Family Medicine

## 2022-03-13 DIAGNOSIS — Z01419 Encounter for gynecological examination (general) (routine) without abnormal findings: Secondary | ICD-10-CM | POA: Diagnosis not present

## 2022-03-13 DIAGNOSIS — Z1231 Encounter for screening mammogram for malignant neoplasm of breast: Secondary | ICD-10-CM | POA: Diagnosis not present

## 2022-03-13 DIAGNOSIS — Z6832 Body mass index (BMI) 32.0-32.9, adult: Secondary | ICD-10-CM | POA: Diagnosis not present

## 2022-03-18 ENCOUNTER — Other Ambulatory Visit: Payer: Self-pay | Admitting: Obstetrics and Gynecology

## 2022-03-18 DIAGNOSIS — R928 Other abnormal and inconclusive findings on diagnostic imaging of breast: Secondary | ICD-10-CM

## 2022-04-03 ENCOUNTER — Ambulatory Visit
Admission: RE | Admit: 2022-04-03 | Discharge: 2022-04-03 | Disposition: A | Discharge status: Home / Self Care | Payer: BC Managed Care – PPO | Source: Ambulatory Visit | Attending: Obstetrics and Gynecology | Admitting: Obstetrics and Gynecology

## 2022-04-03 DIAGNOSIS — R928 Other abnormal and inconclusive findings on diagnostic imaging of breast: Secondary | ICD-10-CM | POA: Diagnosis not present

## 2022-04-26 ENCOUNTER — Telehealth: Payer: Self-pay | Admitting: *Deleted

## 2022-04-26 NOTE — Patient Outreach (Signed)
  Care Coordination   04/26/2022 Name: Karina Briggs MRN: [id number] DOB: 06-Jul-1960   Care Coordination Outreach Attempts:  An unsuccessful telephone outreach was attempted today to offer the patient information about available care coordination services as a benefit of their health plan.   Follow Up Plan:  Additional outreach attempts will be made to offer the patient care coordination information and services.   Encounter Outcome:  No Answer   Care Coordination Interventions:  No, not indicated    Raina Mina, RN Care Management Coordinator [place] Office [phone]

## 2023-01-20 ENCOUNTER — Encounter: Payer: BC Managed Care – PPO | Admitting: Family Medicine
